# Patient Record
Sex: Male | Born: 1955 | Race: White | Hispanic: No | Marital: Single | Smoking: Former smoker
Health system: Southern US, Community
[De-identification: ages and names within clinical notes are randomized; demographics above are authoritative.]

## PROBLEM LIST (undated history)

## (undated) DIAGNOSIS — K449 Diaphragmatic hernia without obstruction or gangrene: Secondary | ICD-10-CM

## (undated) DIAGNOSIS — Z860100 Personal history of colon polyps, unspecified: Secondary | ICD-10-CM

## (undated) DIAGNOSIS — F419 Anxiety disorder, unspecified: Secondary | ICD-10-CM

## (undated) DIAGNOSIS — K589 Irritable bowel syndrome without diarrhea: Secondary | ICD-10-CM

## (undated) DIAGNOSIS — E7800 Pure hypercholesterolemia, unspecified: Secondary | ICD-10-CM

## (undated) DIAGNOSIS — I10 Essential (primary) hypertension: Secondary | ICD-10-CM

## (undated) DIAGNOSIS — M1990 Unspecified osteoarthritis, unspecified site: Secondary | ICD-10-CM

## (undated) DIAGNOSIS — G5601 Carpal tunnel syndrome, right upper limb: Secondary | ICD-10-CM

## (undated) DIAGNOSIS — Z8669 Personal history of other diseases of the nervous system and sense organs: Secondary | ICD-10-CM

## (undated) DIAGNOSIS — C801 Malignant (primary) neoplasm, unspecified: Secondary | ICD-10-CM

## (undated) DIAGNOSIS — K222 Esophageal obstruction: Secondary | ICD-10-CM

## (undated) DIAGNOSIS — N529 Male erectile dysfunction, unspecified: Secondary | ICD-10-CM

## (undated) DIAGNOSIS — K219 Gastro-esophageal reflux disease without esophagitis: Secondary | ICD-10-CM

## (undated) DIAGNOSIS — K2270 Barrett's esophagus without dysplasia: Secondary | ICD-10-CM

## (undated) DIAGNOSIS — K2210 Ulcer of esophagus without bleeding: Secondary | ICD-10-CM

## (undated) DIAGNOSIS — T7840XA Allergy, unspecified, initial encounter: Secondary | ICD-10-CM

## (undated) DIAGNOSIS — D649 Anemia, unspecified: Secondary | ICD-10-CM

## (undated) HISTORY — DX: Gastro-esophageal reflux disease without esophagitis: K21.9

## (undated) HISTORY — DX: Malignant (primary) neoplasm, unspecified: C80.1

## (undated) HISTORY — DX: Irritable bowel syndrome, unspecified: K58.9

## (undated) HISTORY — PX: INGUINAL HERNIA REPAIR: SUR1180

## (undated) HISTORY — DX: Allergy, unspecified, initial encounter: T78.40XA

## (undated) HISTORY — DX: Ulcer of esophagus without bleeding: K22.10

## (undated) HISTORY — PX: CARPAL TUNNEL RELEASE: SHX101

## (undated) HISTORY — DX: Anxiety disorder, unspecified: F41.9

## (undated) HISTORY — DX: Carpal tunnel syndrome, right upper limb: G56.01

## (undated) HISTORY — DX: Personal history of other diseases of the nervous system and sense organs: Z86.69

## (undated) HISTORY — DX: Personal history of colon polyps, unspecified: Z86.0100

## (undated) HISTORY — DX: Male erectile dysfunction, unspecified: N52.9

## (undated) HISTORY — PX: WISDOM TOOTH EXTRACTION: SHX21

## (undated) HISTORY — DX: Barrett's esophagus without dysplasia: K22.70

## (undated) HISTORY — DX: Diaphragmatic hernia without obstruction or gangrene: K44.9

## (undated) HISTORY — DX: Personal history of colonic polyps: Z86.010

## (undated) HISTORY — PX: KNEE ARTHROSCOPY: SUR90

## (undated) HISTORY — DX: Esophageal obstruction: K22.2

## (undated) HISTORY — DX: Essential (primary) hypertension: I10

---

## 2000-08-15 ENCOUNTER — Encounter: Payer: Self-pay | Admitting: Internal Medicine

## 2000-08-15 ENCOUNTER — Ambulatory Visit (HOSPITAL_COMMUNITY): Admission: RE | Admit: 2000-08-15 | Discharge: 2000-08-15 | Payer: Self-pay | Admitting: Internal Medicine

## 2003-08-09 ENCOUNTER — Encounter: Payer: Self-pay | Admitting: Internal Medicine

## 2003-08-09 ENCOUNTER — Ambulatory Visit (HOSPITAL_COMMUNITY): Admission: RE | Admit: 2003-08-09 | Discharge: 2003-08-09 | Payer: Self-pay | Admitting: Internal Medicine

## 2004-07-29 ENCOUNTER — Ambulatory Visit (HOSPITAL_COMMUNITY): Admission: RE | Admit: 2004-07-29 | Discharge: 2004-07-29 | Payer: Self-pay | Admitting: Otolaryngology

## 2004-11-28 ENCOUNTER — Ambulatory Visit: Payer: Self-pay | Admitting: Gastroenterology

## 2005-01-20 ENCOUNTER — Ambulatory Visit: Payer: Self-pay | Admitting: Gastroenterology

## 2005-02-02 ENCOUNTER — Ambulatory Visit: Payer: Self-pay | Admitting: Gastroenterology

## 2005-02-02 DIAGNOSIS — D126 Benign neoplasm of colon, unspecified: Secondary | ICD-10-CM | POA: Insufficient documentation

## 2005-02-02 DIAGNOSIS — K644 Residual hemorrhoidal skin tags: Secondary | ICD-10-CM | POA: Insufficient documentation

## 2005-03-03 ENCOUNTER — Ambulatory Visit: Payer: Self-pay | Admitting: Internal Medicine

## 2005-11-29 ENCOUNTER — Ambulatory Visit: Payer: Self-pay | Admitting: Gastroenterology

## 2006-03-02 ENCOUNTER — Ambulatory Visit: Payer: Self-pay | Admitting: Internal Medicine

## 2006-03-16 ENCOUNTER — Ambulatory Visit: Payer: Self-pay | Admitting: Internal Medicine

## 2006-03-30 ENCOUNTER — Ambulatory Visit: Payer: Self-pay | Admitting: Gastroenterology

## 2006-04-06 ENCOUNTER — Ambulatory Visit (HOSPITAL_COMMUNITY): Admission: RE | Admit: 2006-04-06 | Discharge: 2006-04-06 | Payer: Self-pay | Admitting: Gastroenterology

## 2006-04-06 ENCOUNTER — Encounter: Payer: Self-pay | Admitting: Internal Medicine

## 2006-04-13 ENCOUNTER — Ambulatory Visit: Payer: Self-pay | Admitting: Pulmonary Disease

## 2006-04-17 ENCOUNTER — Ambulatory Visit: Payer: Self-pay | Admitting: Internal Medicine

## 2006-04-18 ENCOUNTER — Ambulatory Visit: Payer: Self-pay | Admitting: Pulmonary Disease

## 2006-04-23 ENCOUNTER — Ambulatory Visit: Payer: Self-pay | Admitting: Gastroenterology

## 2006-05-17 ENCOUNTER — Encounter (INDEPENDENT_AMBULATORY_CARE_PROVIDER_SITE_OTHER): Payer: Self-pay | Admitting: Specialist

## 2006-05-17 ENCOUNTER — Ambulatory Visit: Payer: Self-pay | Admitting: Gastroenterology

## 2006-06-13 ENCOUNTER — Ambulatory Visit: Payer: Self-pay | Admitting: Internal Medicine

## 2006-10-26 ENCOUNTER — Ambulatory Visit: Payer: Self-pay | Admitting: Internal Medicine

## 2006-11-09 ENCOUNTER — Ambulatory Visit: Payer: Self-pay | Admitting: Gastroenterology

## 2007-01-18 ENCOUNTER — Ambulatory Visit: Payer: Self-pay | Admitting: Internal Medicine

## 2007-03-15 ENCOUNTER — Ambulatory Visit: Payer: Self-pay | Admitting: Internal Medicine

## 2007-03-15 LAB — CONVERTED CEMR LAB
ALT: 19 U/L (ref 0–40)
AST: 21 U/L (ref 0–37)
Albumin: 3.8 g/dL (ref 3.5–5.2)
Alkaline Phosphatase: 97 U/L (ref 39–117)
BUN: 8 mg/dL (ref 6–23)
Basophils Absolute: 0 K/uL (ref 0.0–0.1)
Basophils Relative: 0.3 % (ref 0.0–1.0)
Bilirubin Urine: NEGATIVE
Bilirubin, Direct: 0.2 mg/dL (ref 0.0–0.3)
CO2: 33 meq/L — ABNORMAL HIGH (ref 19–32)
Calcium: 8.8 mg/dL (ref 8.4–10.5)
Chloride: 105 meq/L (ref 96–112)
Cholesterol: 217 mg/dL (ref 0–200)
Creatinine, Ser: 0.8 mg/dL (ref 0.4–1.5)
Direct LDL: 142.6 mg/dL
Eosinophils Absolute: 0.2 K/uL (ref 0.0–0.6)
Eosinophils Relative: 3.5 % (ref 0.0–5.0)
GFR calc Af Amer: 131 mL/min
GFR calc non Af Amer: 108 mL/min
Glucose, Bld: 122 mg/dL — ABNORMAL HIGH (ref 70–99)
HCT: 42.2 % (ref 39.0–52.0)
HDL: 55.5 mg/dL (ref >39.0)
Hemoglobin, Urine: NEGATIVE
Hemoglobin: 15 g/dL (ref 13.0–17.0)
Hgb A1c MFr Bld: 5.8 % (ref 4.6–6.0)
Ketones, ur: NEGATIVE mg/dL
Leukocytes, UA: NEGATIVE
Lymphocytes Relative: 20.5 % (ref 12.0–46.0)
MCHC: 35.5 g/dL (ref 30.0–36.0)
MCV: 95.7 fL (ref 78.0–100.0)
Monocytes Absolute: 0.7 K/uL (ref 0.2–0.7)
Monocytes Relative: 9.8 % (ref 3.0–11.0)
Neutro Abs: 4.7 K/uL (ref 1.4–7.7)
Neutrophils Relative %: 65.9 % (ref 43.0–77.0)
Nitrite: NEGATIVE
PSA: 2.7 ng/mL (ref 0.10–4.00)
Platelets: 285 K/uL (ref 150–400)
Potassium: 4.4 meq/L (ref 3.5–5.1)
RBC: 4.42 M/uL (ref 4.22–5.81)
RDW: 12.6 % (ref 11.5–14.6)
Sodium: 142 meq/L (ref 135–145)
Specific Gravity, Urine: 1.015 (ref 1.000–1.03)
TSH: 0.41 u[IU]/mL (ref 0.35–5.50)
Total Bilirubin: 0.7 mg/dL (ref 0.3–1.2)
Total CHOL/HDL Ratio: 3.9
Total Protein, Urine: NEGATIVE mg/dL
Total Protein: 7.1 g/dL (ref 6.0–8.3)
Triglycerides: 148 mg/dL (ref 0–149)
Urine Glucose: NEGATIVE mg/dL
Urobilinogen, UA: 0.2 (ref 0.0–1.0)
VLDL: 30 mg/dL (ref 0–40)
WBC: 7.1 10*3/microliter (ref 4.5–10.5)
pH: 6 (ref 5.0–8.0)

## 2007-11-19 ENCOUNTER — Ambulatory Visit: Payer: Self-pay | Admitting: Gastroenterology

## 2007-11-27 ENCOUNTER — Ambulatory Visit: Payer: Self-pay | Admitting: Gastroenterology

## 2007-11-27 LAB — CONVERTED CEMR LAB
OCCULT 1: NEGATIVE
OCCULT 3: NEGATIVE
OCCULT 4: NEGATIVE

## 2008-01-08 DIAGNOSIS — K5900 Constipation, unspecified: Secondary | ICD-10-CM | POA: Insufficient documentation

## 2008-01-08 DIAGNOSIS — G4730 Sleep apnea, unspecified: Secondary | ICD-10-CM | POA: Insufficient documentation

## 2008-01-08 DIAGNOSIS — Z8669 Personal history of other diseases of the nervous system and sense organs: Secondary | ICD-10-CM | POA: Insufficient documentation

## 2008-01-08 DIAGNOSIS — F329 Major depressive disorder, single episode, unspecified: Secondary | ICD-10-CM | POA: Insufficient documentation

## 2008-01-08 DIAGNOSIS — K589 Irritable bowel syndrome without diarrhea: Secondary | ICD-10-CM | POA: Insufficient documentation

## 2008-01-08 DIAGNOSIS — F17200 Nicotine dependence, unspecified, uncomplicated: Secondary | ICD-10-CM | POA: Insufficient documentation

## 2008-01-08 DIAGNOSIS — E7800 Pure hypercholesterolemia, unspecified: Secondary | ICD-10-CM | POA: Insufficient documentation

## 2008-01-08 DIAGNOSIS — K5909 Other constipation: Secondary | ICD-10-CM

## 2008-01-08 DIAGNOSIS — T7840XA Allergy, unspecified, initial encounter: Secondary | ICD-10-CM | POA: Insufficient documentation

## 2008-01-08 DIAGNOSIS — F411 Generalized anxiety disorder: Secondary | ICD-10-CM | POA: Insufficient documentation

## 2008-01-08 DIAGNOSIS — K219 Gastro-esophageal reflux disease without esophagitis: Secondary | ICD-10-CM | POA: Insufficient documentation

## 2008-01-08 DIAGNOSIS — K5730 Diverticulosis of large intestine without perforation or abscess without bleeding: Secondary | ICD-10-CM | POA: Insufficient documentation

## 2008-02-07 ENCOUNTER — Ambulatory Visit: Payer: Self-pay | Admitting: Internal Medicine

## 2008-02-07 ENCOUNTER — Encounter: Payer: Self-pay | Admitting: Internal Medicine

## 2008-02-07 DIAGNOSIS — D509 Iron deficiency anemia, unspecified: Secondary | ICD-10-CM | POA: Insufficient documentation

## 2008-02-07 DIAGNOSIS — J309 Allergic rhinitis, unspecified: Secondary | ICD-10-CM | POA: Insufficient documentation

## 2008-02-07 DIAGNOSIS — Z86010 Personal history of colonic polyps: Secondary | ICD-10-CM | POA: Insufficient documentation

## 2008-02-07 LAB — CONVERTED CEMR LAB
ALT: 21 units/L (ref 0–53)
AST: 21 units/L (ref 0–37)
Albumin: 3.8 g/dL (ref 3.5–5.2)
Alkaline Phosphatase: 89 units/L (ref 39–117)
BUN: 7 mg/dL (ref 6–23)
Basophils Relative: 0.2 % (ref 0.0–1.0)
Bilirubin Urine: NEGATIVE
Calcium: 9 mg/dL (ref 8.4–10.5)
Chloride: 107 meq/L (ref 96–112)
Direct LDL: 143 mg/dL
Folate: 19.7 ng/mL
GFR calc non Af Amer: 125 mL/min
Glucose, Bld: 100 mg/dL — ABNORMAL HIGH (ref 70–99)
HDL: 54.9 mg/dL (ref >39.0)
Hemoglobin: 14.2 g/dL (ref 13.0–17.0)
Leukocytes, UA: NEGATIVE
Monocytes Absolute: 0.7 10*3/uL (ref 0.2–0.7)
Monocytes Relative: 9.7 % (ref 3.0–11.0)
PSA: 2.06 ng/mL (ref 0.10–4.00)
RBC: 4.21 M/uL — ABNORMAL LOW (ref 4.22–5.81)
RDW: 12.9 % (ref 11.5–14.6)
Saturation Ratios: 38.9 % (ref 20.0–50.0)
Specific Gravity, Urine: 1.01 (ref 1.000–1.03)
Total Protein, Urine: NEGATIVE mg/dL
Urobilinogen, UA: 0.2 (ref 0.0–1.0)
VLDL: 31 mg/dL (ref 0–40)
Vitamin B-12: 964 pg/mL — ABNORMAL HIGH (ref 211–911)
pH: 6 (ref 5.0–8.0)

## 2008-03-24 ENCOUNTER — Ambulatory Visit: Payer: Self-pay | Admitting: Gastroenterology

## 2008-04-10 ENCOUNTER — Encounter: Payer: Self-pay | Admitting: Gastroenterology

## 2008-04-10 ENCOUNTER — Encounter: Payer: Self-pay | Admitting: Internal Medicine

## 2008-04-10 ENCOUNTER — Ambulatory Visit: Payer: Self-pay | Admitting: Gastroenterology

## 2008-08-07 ENCOUNTER — Ambulatory Visit: Payer: Self-pay | Admitting: Gastroenterology

## 2008-09-07 ENCOUNTER — Ambulatory Visit: Payer: Self-pay | Admitting: Internal Medicine

## 2008-09-07 DIAGNOSIS — L0231 Cutaneous abscess of buttock: Secondary | ICD-10-CM | POA: Insufficient documentation

## 2008-09-07 DIAGNOSIS — L03317 Cellulitis of buttock: Secondary | ICD-10-CM

## 2009-02-26 ENCOUNTER — Ambulatory Visit: Payer: Self-pay | Admitting: Internal Medicine

## 2009-02-26 DIAGNOSIS — R030 Elevated blood-pressure reading, without diagnosis of hypertension: Secondary | ICD-10-CM | POA: Insufficient documentation

## 2009-02-26 DIAGNOSIS — L989 Disorder of the skin and subcutaneous tissue, unspecified: Secondary | ICD-10-CM | POA: Insufficient documentation

## 2009-03-02 LAB — CONVERTED CEMR LAB
ALT: 21 units/L (ref 0–53)
AST: 19 units/L (ref 0–37)
Albumin: 3.4 g/dL — ABNORMAL LOW (ref 3.5–5.2)
BUN: 11 mg/dL (ref 6–23)
Basophils Absolute: 0.1 10*3/uL (ref 0.0–0.1)
Basophils Relative: 1.2 % (ref 0.0–3.0)
CO2: 32 meq/L (ref 19–32)
Calcium: 8.7 mg/dL (ref 8.4–10.5)
Chloride: 108 meq/L (ref 96–112)
Cholesterol: 169 mg/dL (ref 0–200)
Creatinine, Ser: 0.9 mg/dL (ref 0.4–1.5)
Eosinophils Relative: 2.8 % (ref 0.0–5.0)
Hemoglobin: 10 g/dL — ABNORMAL LOW (ref 13.0–17.0)
Ketones, ur: NEGATIVE mg/dL
Leukocytes, UA: NEGATIVE
Lymphocytes Relative: 16.6 % (ref 12.0–46.0)
MCHC: 33 g/dL (ref 30.0–36.0)
MCV: 88.3 fL (ref 78.0–100.0)
Neutro Abs: 4.8 10*3/uL (ref 1.4–7.7)
PSA: 1.97 ng/mL (ref 0.10–4.00)
RBC: 3.44 M/uL — ABNORMAL LOW (ref 4.22–5.81)
Specific Gravity, Urine: 1.015 (ref 1.000–1.035)
TSH: 0.5 microintl units/mL (ref 0.35–5.50)
Total Bilirubin: 0.6 mg/dL (ref 0.3–1.2)
Total Protein: 6.6 g/dL (ref 6.0–8.3)
Triglycerides: 133 mg/dL (ref 0–149)
Urine Glucose: NEGATIVE mg/dL
WBC: 6.9 10*3/uL (ref 4.5–10.5)
pH: 7 (ref 5.0–8.0)

## 2009-03-05 ENCOUNTER — Ambulatory Visit: Payer: Self-pay | Admitting: Internal Medicine

## 2009-03-05 DIAGNOSIS — R7302 Impaired glucose tolerance (oral): Secondary | ICD-10-CM | POA: Insufficient documentation

## 2009-03-09 ENCOUNTER — Encounter: Payer: Self-pay | Admitting: Internal Medicine

## 2009-04-16 ENCOUNTER — Ambulatory Visit: Payer: Self-pay | Admitting: Internal Medicine

## 2009-04-16 LAB — CONVERTED CEMR LAB
Basophils Absolute: 0 10*3/uL (ref 0.0–0.1)
Basophils Relative: 0.1 % (ref 0.0–3.0)
Eosinophils Absolute: 0.2 10*3/uL (ref 0.0–0.7)
Eosinophils Relative: 2.1 % (ref 0.0–5.0)
HCT: 36.2 % — ABNORMAL LOW (ref 39.0–52.0)
Hemoglobin: 12.5 g/dL — ABNORMAL LOW (ref 13.0–17.0)
Lymphs Abs: 1.5 10*3/uL (ref 0.7–4.0)
MCV: 92.9 fL (ref 78.0–100.0)
Monocytes Absolute: 0.8 10*3/uL (ref 0.1–1.0)
Monocytes Relative: 10 % (ref 3.0–12.0)
Neutro Abs: 5.9 10*3/uL (ref 1.4–7.7)
Platelets: 283 10*3/uL (ref 150.0–400.0)
RBC: 3.89 M/uL — ABNORMAL LOW (ref 4.22–5.81)
Saturation Ratios: 13.3 % — ABNORMAL LOW (ref 20.0–50.0)
Transferrin: 262.8 mg/dL (ref 212.0–360.0)
WBC: 8.4 10*3/uL (ref 4.5–10.5)

## 2009-08-06 ENCOUNTER — Ambulatory Visit: Payer: Self-pay | Admitting: Gastroenterology

## 2009-08-06 DIAGNOSIS — E669 Obesity, unspecified: Secondary | ICD-10-CM | POA: Insufficient documentation

## 2009-08-06 LAB — CONVERTED CEMR LAB
Alkaline Phosphatase: 90 units/L (ref 39–117)
Basophils Absolute: 0 10*3/uL (ref 0.0–0.1)
Bilirubin, Direct: 0.1 mg/dL (ref 0.0–0.3)
CO2: 32 meq/L (ref 19–32)
Calcium: 8.8 mg/dL (ref 8.4–10.5)
Eosinophils Absolute: 0.2 10*3/uL (ref 0.0–0.7)
GFR calc non Af Amer: 106.61 mL/min (ref >60)
HCT: 38.2 % — ABNORMAL LOW (ref 39.0–52.0)
Lymphs Abs: 1.3 10*3/uL (ref 0.7–4.0)
Monocytes Relative: 8.6 % (ref 3.0–12.0)
Platelets: 250 10*3/uL (ref 150.0–400.0)
RDW: 13.5 % (ref 11.5–14.6)
Sodium: 145 meq/L (ref 135–145)
Total Bilirubin: 0.8 mg/dL (ref 0.3–1.2)
Total Protein: 6.7 g/dL (ref 6.0–8.3)

## 2009-08-20 ENCOUNTER — Ambulatory Visit: Payer: Self-pay | Admitting: Internal Medicine

## 2009-08-20 DIAGNOSIS — M25569 Pain in unspecified knee: Secondary | ICD-10-CM | POA: Insufficient documentation

## 2009-09-02 ENCOUNTER — Telehealth: Payer: Self-pay | Admitting: Internal Medicine

## 2009-09-03 ENCOUNTER — Encounter: Admission: RE | Admit: 2009-09-03 | Discharge: 2009-09-03 | Payer: Self-pay | Admitting: Internal Medicine

## 2009-09-04 ENCOUNTER — Encounter: Payer: Self-pay | Admitting: Internal Medicine

## 2009-09-06 ENCOUNTER — Encounter: Payer: Self-pay | Admitting: Internal Medicine

## 2009-09-30 ENCOUNTER — Encounter: Admission: RE | Admit: 2009-09-30 | Discharge: 2009-11-04 | Payer: Self-pay | Admitting: Specialist

## 2010-02-25 ENCOUNTER — Ambulatory Visit: Payer: Self-pay | Admitting: Gastroenterology

## 2010-02-25 LAB — CONVERTED CEMR LAB
Basophils Absolute: 0 10*3/uL (ref 0.0–0.1)
Eosinophils Absolute: 0.2 10*3/uL (ref 0.0–0.7)
Ferritin: 25.8 ng/mL (ref 22.0–322.0)
Folate: >20 ng/mL
Iron: 85 ug/dL (ref 42–165)
Lymphocytes Relative: 18 % (ref 12.0–46.0)
MCHC: 34.1 g/dL (ref 30.0–36.0)
Monocytes Relative: 5.4 % (ref 3.0–12.0)
Neutrophils Relative %: 73.4 % (ref 43.0–77.0)
Platelets: 234 10*3/uL (ref 150.0–400.0)
RDW: 12.8 % (ref 11.5–14.6)
Saturation Ratios: 25.5 % (ref 20.0–50.0)
Transferrin: 238.1 mg/dL (ref 212.0–360.0)

## 2010-03-25 ENCOUNTER — Ambulatory Visit: Payer: Self-pay | Admitting: Internal Medicine

## 2010-03-25 DIAGNOSIS — R209 Unspecified disturbances of skin sensation: Secondary | ICD-10-CM | POA: Insufficient documentation

## 2010-03-25 DIAGNOSIS — N529 Male erectile dysfunction, unspecified: Secondary | ICD-10-CM | POA: Insufficient documentation

## 2010-03-25 LAB — CONVERTED CEMR LAB
ALT: 19 units/L (ref 0–53)
AST: 19 units/L (ref 0–37)
Alkaline Phosphatase: 91 units/L (ref 39–117)
Chloride: 105 meq/L (ref 96–112)
Cholesterol: 208 mg/dL — ABNORMAL HIGH (ref 0–200)
Direct LDL: 151.7 mg/dL
GFR calc non Af Amer: 106.36 mL/min (ref >60)
Hgb A1c MFr Bld: 5.9 % (ref 4.6–6.5)
Ketones, ur: NEGATIVE mg/dL
Microalb Creat Ratio: 18.6 mg/g (ref 0.0–30.0)
Potassium: 4 meq/L (ref 3.5–5.1)
Specific Gravity, Urine: >=1.03 (ref 1.000–1.030)
TSH: 0.64 microintl units/mL (ref 0.35–5.50)
Total Bilirubin: 0.5 mg/dL (ref 0.3–1.2)
Total CHOL/HDL Ratio: 3
Total Protein, Urine: NEGATIVE mg/dL
Triglycerides: 39 mg/dL (ref 0.0–149.0)
Urine Glucose: NEGATIVE mg/dL
Urobilinogen, UA: 0.2 (ref 0.0–1.0)
VLDL: 7.8 mg/dL (ref 0.0–40.0)

## 2010-03-26 LAB — CONVERTED CEMR LAB: Vit D, 25-Hydroxy: 20 ng/mL — ABNORMAL LOW (ref 30–89)

## 2011-01-15 LAB — CONVERTED CEMR LAB
Folate: >20 ng/mL
Saturation Ratios: 4.9 % — ABNORMAL LOW (ref 20.0–50.0)

## 2011-01-17 NOTE — Assessment & Plan Note (Signed)
Summary: FU ON MEDS/NWS   Vital Signs:  Patient profile:   55 year old male Height:      73 inches Weight:      270.75 pounds BMI:     35.85 O2 Sat:      96 % on Room air Temp:     97.7 degrees F oral Pulse rate:   78 / minute BP sitting:   120 / 70  (left arm) Cuff size:   regular  Vitals Entered ByZella Ball Ewing (March 25, 2010 10:17 AM)  O2 Flow:  Room air  CC: followup on medications/RE   Primary Care Provider:  Oliver Barre, MD  CC:  followup on medications/RE.  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  Overall good compliance with meds, trying to follow low chol, DM diet, wt stable, little excercise however   Problems Prior to Update: 1)  Erectile Dysfunction, Organic  (ICD-607.84) 2)  Paresthesia, Hands  (ICD-782.0) 3)  Knee Pain, Right  (ICD-719.46) 4)  Obesity, Unspecified  (ICD-278.00) 5)  Cigarette Smoker  (ICD-305.1) 6)  Obesity  (ICD-278.00) 7)  Diabetes Mellitus, Type II  (ICD-250.00) 8)  Elevated Blood Pressure Without Diagnosis of Hypertension  (ICD-796.2) 9)  Skin Lesions, Multiple  (ICD-709.9) 10)  Cellulitis and Abscess of Buttock  (ICD-682.5) 11)  Preventive Health Care  (ICD-V70.0) 12)  Allergic Rhinitis  (ICD-477.9) 13)  Colonic Polyps, Hx of  (ICD-V12.72) 14)  Anemia-iron Deficiency  (ICD-280.9) 15)  Gerd  (ICD-530.81) 16)  Depression  (ICD-311) 17)  Colonic Polyps  (ICD-211.3) 18)  External Hemorrhoids   (ICD-455.3) 19)  Sleep Apnea  (ICD-780.57) 20)  Smoker  (ICD-305.1) 21)  Hypercholesterolemia  (ICD-272.0) 22)  Bell's Palsy, Hx of  (ICD-V12.49) 23)  Diverticulosis, Colon  (ICD-562.10) 24)  Allergy  (ICD-995.3) 25)  Anxiety  (ICD-300.00) 26)  Ibs  (ICD-564.1) 27)  Depression, Chronic  (ICD-311) 28)  Constipation, Chronic  (ICD-564.09) 29)  Gastroesophageal Reflux Disease, Chronic  (ICD-530.81)  Medications Prior to Update: 1)  Levitra 20 Mg Tabs (Vardenafil Hcl) .... Take 1 Tablet By Mouth As Needed 2)  Aciphex 20 Mg  Tbec (Rabeprazole Sodium) .... Take One By Mouth Once Daily 3)  Metamucil 30.9 % Powd (Psyllium) .... Once Daily 4)  Multivitamins  Tabs (Multiple Vitamin) .... Take 1 Tablet By Mouth Once A Day 5)  Vitamin C .... Take 1 Tablet By Mouth Once A Day 6)  Fe-Caps 250 Mg Cr-Caps (Ferrous Sulfate) .... One Tablet By Mouth Once Daily  Current Medications (verified): 1)  Levitra 20 Mg Tabs (Vardenafil Hcl) .... Take 1 Tablet By Mouth As Needed 2)  Aciphex 20 Mg  Tbec (Rabeprazole Sodium) .... Take One By Mouth Once Daily 3)  Metamucil 30.9 % Powd (Psyllium) .... Once Daily 4)  Multivitamins  Tabs (Multiple Vitamin) .... Take 1 Tablet By Mouth Once A Day 5)  Vitamin C .... Take 1 Tablet By Mouth Once A  Day 6)  Fe-Caps 250 Mg Cr-Caps (Ferrous Sulfate) .... One Tablet By Mouth Once Daily  Allergies (verified): 1)  ! Aspirin 2)  ! Amoxicillin  Past History:  Past Medical History: Last updated: 04/16/2009 chronic bronchitis hx of bell's palsy E.D. 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  Risk Factors: Exercise: yes (08/07/2008)  Risk Factors: Smoking Status: current (02/07/2008)  Past Surgical History: Inguinal herniorrhaphy - bilat s/p right knee arthroscopy - dr Thomasena Edis  Review of Systems  The patient denies anorexia, fever, vision loss, decreased hearing, hoarseness, chest pain, syncope, dyspnea on exertion, peripheral edema, prolonged cough, headaches, hemoptysis, abdominal pain, melena, hematochezia, severe indigestion/heartburn, hematuria, muscle weakness, suspicious skin lesions, transient blindness, difficulty walking, depression, unusual weight change, abnormal bleeding, enlarged lymph nodes, and angioedema.         all otherwise negative per pt -    Physical Exam  General:  alert and overweight-appearing.   Head:  normocephalic and atraumatic.   Eyes:  vision grossly intact, pupils equal, and pupils round.   Ears:  R ear normal and L ear normal.   Nose:  no external deformity and no nasal discharge.   Mouth:  no gingival abnormalities and pharynx pink and moist.   Neck:  supple and no masses.   Lungs:  normal respiratory effort and normal breath sounds.   Heart:  normal rate and regular rhythm.   Abdomen:  soft, non-tender, and normal bowel sounds.   Msk:  no joint tenderness and no joint swelling.   Extremities:  no edema, no erythema  Neurologic:  cranial nerves II-XII intact and strength normal in all extremities.     Impression & Recommendations:  Problem # 1:  Preventive Health Care (ICD-V70.0)  Overall doing well, age appropriate education and counseling updated and referral for appropriate preventive services done unless declined, immunizations up to date or declined, diet counseling done if overweight, urged to quit smoking if smokes , most recent  labs reviewed and current ordered if appropriate, ecg reviewed or declined (interpretation per ECG scanned in the EMR if done); information regarding Medicare Prevention requirements given if appropriate   Orders: T-Vitamin D (25-Hydroxy) (91478-29562) TLB-Hepatic/Liver Function Pnl (80076-HEPATIC) TLB-TSH (Thyroid Stimulating Hormone) (84443-TSH) TLB-PSA (Prostate Specific Antigen) (84153-PSA) TLB-Udip ONLY (81003-UDIP)  Problem # 2:  PARESTHESIA, HANDS (ICD-782.0) c/w prob mild CTS bialt - for nighttime wrist splints  Problem # 3:  ERECTILE DYSFUNCTION, ORGANIC (ICD-607.84)  His updated medication list for this problem includes:    Levitra 20 Mg Tabs (Vardenafil hcl) .Marland Kitchen... Take 1 tablet by mouth as needed treat as above, f/u any worsening signs or symptoms   Problem # 4:  DIABETES MELLITUS, TYPE II (ICD-250.00)  Labs Reviewed: Creat: 0.8 (08/06/2009)    Reviewed HgBA1c results: 6.1 (03/05/2009)  5.8 (03/15/2007)  Orders: TLB-A1C / Hgb A1C (Glycohemoglobin) (83036-A1C) TLB-BMP (Basic Metabolic Panel-BMET) (80048-METABOL) TLB-Lipid Panel (80061-LIPID) TLB-Microalbumin/Creat Ratio, Urine (82043-MALB) stable overall by hx and exam, ok to continue meds/tx as is   Problem # 5:  HYPERCHOLESTEROLEMIA (ICD-272.0)  Labs Reviewed: SGOT: 18 (08/06/2009)   SGPT: 18 (08/06/2009)   HDL:51.3 (02/26/2009), 54.9 (02/07/2008)  LDL:91 (02/26/2009), DEL (13/07/6577)  Chol:169 (02/26/2009), 217 (02/07/2008)  Trig:133 (02/26/2009), 156 (02/07/2008) stable overall by hx and exam, ok to continue meds/tx as is   Complete Medication List: 1)  Levitra 20 Mg Tabs (Vardenafil hcl) .... Take 1 tablet by mouth as needed 2)  Aciphex 20 Mg Tbec (Rabeprazole sodium) .... Take one by mouth once daily 3)  Metamucil 30.9 % Powd (Psyllium) .... Once daily 4)  Multivitamins Tabs (Multiple vitamin) .... Take 1 tablet by mouth once a day 5)  Vitamin C  .... Take 1 tablet by mouth once a day 6)  Fe-caps 250 Mg  Cr-caps (Ferrous sulfate) .... One tablet by mouth once daily  Other Orders: Ankle / Wrist Splint (I6962)  Patient Instructions: 1)  You are given the bilat wrist splints 2)  Please go to the Lab in the basement for your blood and/or urine tests today 3)  Continue all previous medications as before this visit  4)  Please schedule a follow-up appointment in 1 yr or sooner if needed Prescriptions: LEVITRA 20 MG TABS (VARDENAFIL HCL) Take 1 tablet by mouth as needed  #5 x 11   Entered and Authorized by:   Corwin Levins MD   Signed by:   Corwin Levins MD on 03/25/2010   Method used:   Electronically to        [facility] [address] #[id number]* (retail)       [address] [place], [place]  [address]       Ph: [phone]       Fax: [phone]   RxID:   [id number]

## 2011-01-17 NOTE — Assessment & Plan Note (Signed)
Summary: medication refill--ch.    History of Present Illness Visit Type: Follow-up Visit Primary GI MD: Sheryn Bison MD FACP FAGA Primary Provider: Oliver Barre, MD Requesting Provider: n/a Chief Complaint:  Pt states he needs Aciphex refilled. Pt denies any GI complaints  History of Present Illness:   55 year old Caucasian male with chronic GERD up-to-date nose endoscopy and colonoscopy exams. He has unexplained chronic iron deficiency and continues on oral iron therapy. He denies any GI complaints whatsoever taking AcipHex 20 mg a day. He continues to drink 6-8 beers a day but has no history of hepatitis or pancreatitis or consequences of alcohol abuse. Review of his labs otherwise were unremarkable.   GI Review of Systems      Denies abdominal pain, acid reflux, belching, bloating, chest pain, dysphagia with liquids, dysphagia with solids, heartburn, loss of appetite, nausea, vomiting, vomiting blood, weight loss, and  weight gain.        Denies anal fissure, black tarry stools, change in bowel habit, constipation, diarrhea, diverticulosis, fecal incontinence, heme positive stool, hemorrhoids, irritable bowel syndrome, jaundice, light color stool, liver problems, rectal bleeding, and  rectal pain.    Current Medications (verified): 1)  Levitra 20 Mg Tabs (Vardenafil Hcl) .... Take 1 Tablet By Mouth As Needed 2)  Aciphex 20 Mg  Tbec (Rabeprazole Sodium) .... Take One By Mouth Once Daily 3)  Metamucil 30.9 % Powd (Psyllium) .... Once Daily 4)  Multivitamins  Tabs (Multiple Vitamin) .... Take 1 Tablet By Mouth Once A Day 5)  Vitamin C .... Take 1 Tablet By Mouth Once A Day 6)  Fe-Caps 250 Mg Cr-Caps (Ferrous Sulfate) .... One Tablet By Mouth Once Daily  Allergies (verified): 1)  ! Aspirin 2)  ! Amoxicillin  Past History:  Past medical, surgical, family and social histories (including risk factors) reviewed for relevance to current acute and chronic problems.  Past Medical  History: Reviewed history from 04/16/2009 and no changes required. chronic bronchitis hx of bell's palsy E.D. Depression GERD hx of Barrett's/ulcerative esophagitis IBS chronic constipation Anemia-iron deficiency - for chronic oral therapy Diverticulosis, colon Colonic polyps, hx of - adenomatous Anxiety Allergic rhinitis prob OSA Diabetes mellitus, type II - diet  Past Surgical History: Reviewed history from 02/07/2008 and no changes required. Inguinal herniorrhaphy - bilat  Family History: Reviewed history from 08/07/2008 and no changes required. brother with HTN mother with supranuclear palsy father with heart disease/MI No FH of Colon Cancer  Social History: Reviewed history from 08/07/2008 and no changes required. 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  Review of Systems       The patient complains of anxiety-new.  The patient denies allergy/sinus, anemia, arthritis/joint pain, back pain, blood in urine, breast changes/lumps, change in vision, confusion, cough, coughing up blood, depression-new, fainting, fatigue, fever, headaches-new, hearing problems, heart murmur, heart rhythm changes, itching, muscle pains/cramps, night sweats, nosebleeds, shortness of breath, skin rash, sleeping problems, sore throat, swelling of feet/legs, swollen lymph glands, thirst - excessive, urination - excessive, urination changes/pain, urine leakage, vision changes, and voice change.    Vital Signs:  Patient profile:   55 year old male Height:      73 inches Weight:      274 pounds BMI:     36.28 BSA:     2.46 Pulse rate:   74 / minute Pulse rhythm:   regular BP sitting:   132 /  84  (left arm) Cuff size:   regular  Vitals Entered By: Ok Anis CMA (February 25, 2010 10:50 AM)  Physical Exam  General:  Well developed, well nourished, no acute distress.obese.    Head:  Normocephalic and atraumatic. Eyes:  PERRLA, no icterus.exam deferred to patient's ophthalmologist.   Abdomen:  Soft, nontender and nondistended. No masses, hepatosplenomegaly or hernias noted. Normal bowel sounds. Psych:  Alert and cooperative. Normal mood and affect.   Impression & Recommendations:  Problem # 1:  GERD (ICD-530.81) Assessment Improved Review of his record shows no evidence of Barrett's mucosa. Is completely asymptomatic at this time on daily PPI therapy which I have renewed and I have also reviewed reflux maneuvers with this patient.  Problem # 2:  ANEMIA-IRON DEFICIENCY (ICD-280.9) Assessment: Improved repeat CBC and iron studies ordered.  Patient Instructions: 1)  Copy sent to : Dr. Oliver Barre 2)  Please continue current medications.  3)  Avoid foods high in acid content ( tomatoes, citrus juices, spicy foods) . Avoid eating within 3 to 4 hours of lying down or before exercising. Do not over eat; try smaller more frequent meals. Elevate head of bed four inches when sleeping.  4)  Please schedule a follow-up appointment in 1 year. 5)  The medication list was reviewed and reconciled.  All changed / newly prescribed medications were explained.  A complete medication list was provided to the patient / caregiver.  Appended Document: medication refill--ch.    Clinical Lists Changes  Medications: Changed medication from ACIPHEX 20 MG  TBEC (RABEPRAZOLE SODIUM) take one by mouth once daily to ACIPHEX 20 MG  TBEC (RABEPRAZOLE SODIUM) take one by mouth once daily - Signed Rx of ACIPHEX 20 MG  TBEC (RABEPRAZOLE SODIUM) take one by mouth once daily;  #30 x 11;  Signed;  Entered by: Ashok Cordia RN;  Authorized by: Mardella Layman MD [facility];  Method used: Electronically to [facility] #[id number]*, [address] [place], [place]  [address], Ph: [phone], Fax: [phone] Orders: Added new Test order of TLB-B12, Serum-Total ONLY [id number]) - Signed Added  new Test order of TLB-Ferritin (82728-FER) - Signed Added new Test order of TLB-Folic Acid (Folate) (82746-FOL) - Signed Added new Test order of TLB-IBC Pnl (Iron/FE;Transferrin) (83550-IBC) - Signed Added new Test order of TLB-CBC Platelet - w/Differential (85025-CBCD) - Signed    Prescriptions: ACIPHEX 20 MG  TBEC (RABEPRAZOLE SODIUM) take one by mouth once daily  #30 x 11   Entered by:   Ashok Cordia RN   Authorized by:   Mardella Layman MD [facility]   Signed by:   Ashok Cordia RN on 02/25/2010   Method used:   Electronically to        [facility] [address] #[id number]* (retail)       [address] [place], [place]  [address]       Ph: [phone]       Fax: [phone]   RxID:   [id number]

## 2011-03-17 ENCOUNTER — Encounter: Payer: Self-pay | Admitting: Gastroenterology

## 2011-03-17 ENCOUNTER — Ambulatory Visit (INDEPENDENT_AMBULATORY_CARE_PROVIDER_SITE_OTHER): Payer: 59 | Admitting: Gastroenterology

## 2011-03-17 ENCOUNTER — Other Ambulatory Visit (INDEPENDENT_AMBULATORY_CARE_PROVIDER_SITE_OTHER): Payer: 59

## 2011-03-17 VITALS — BP 132/78 | HR 76 | Ht 73.0 in | Wt 274.0 lb

## 2011-03-17 DIAGNOSIS — D126 Benign neoplasm of colon, unspecified: Secondary | ICD-10-CM

## 2011-03-17 DIAGNOSIS — K5909 Other constipation: Secondary | ICD-10-CM

## 2011-03-17 DIAGNOSIS — Z86010 Personal history of colonic polyps: Secondary | ICD-10-CM

## 2011-03-17 DIAGNOSIS — E119 Type 2 diabetes mellitus without complications: Secondary | ICD-10-CM

## 2011-03-17 DIAGNOSIS — K219 Gastro-esophageal reflux disease without esophagitis: Secondary | ICD-10-CM

## 2011-03-17 DIAGNOSIS — D509 Iron deficiency anemia, unspecified: Secondary | ICD-10-CM

## 2011-03-17 LAB — CBC WITH DIFFERENTIAL/PLATELET
Basophils Absolute: 0.1 10*3/uL (ref 0.0–0.1)
HCT: 38.8 % — ABNORMAL LOW (ref 39.0–52.0)
Lymphs Abs: 1.4 10*3/uL (ref 0.7–4.0)
MCV: 97.1 fl (ref 78.0–100.0)
Monocytes Absolute: 0.7 10*3/uL (ref 0.1–1.0)
Neutro Abs: 4.8 10*3/uL (ref 1.4–7.7)
Platelets: 222 10*3/uL (ref 150.0–400.0)
RDW: 13.9 % (ref 11.5–14.6)

## 2011-03-17 LAB — HEPATIC FUNCTION PANEL
AST: 16 U/L (ref 0–37)
Albumin: 3.7 g/dL (ref 3.5–5.2)
Alkaline Phosphatase: 94 U/L (ref 39–117)
Total Bilirubin: 0.3 mg/dL (ref 0.3–1.2)

## 2011-03-17 LAB — FERRITIN: Ferritin: 44.5 ng/mL (ref 22.0–322.0)

## 2011-03-17 LAB — IBC PANEL: Saturation Ratios: 17.4 % — ABNORMAL LOW (ref 20.0–50.0)

## 2011-03-17 MED ORDER — RABEPRAZOLE SODIUM 20 MG PO TBEC: 20.0000 mg | DELAYED_RELEASE_TABLET | Freq: Every day | ORAL | Status: DC

## 2011-03-17 NOTE — Patient Instructions (Signed)
Please go to the basement today for your labs.  Your prescription(s) have been sent to you pharmacy.

## 2011-03-17 NOTE — Assessment & Plan Note (Signed)
Followup CBC an anemia profile ordered

## 2011-03-17 NOTE — Assessment & Plan Note (Signed)
followup as per clinical protocol

## 2011-03-17 NOTE — Assessment & Plan Note (Signed)
High-fiber diet as tolerated with followup colonoscopy as per clinical protocol.

## 2011-03-17 NOTE — Assessment & Plan Note (Signed)
High-fiber diet as tolerated

## 2011-03-17 NOTE — Assessment & Plan Note (Signed)
Continue meds per primary care

## 2011-03-17 NOTE — Progress Notes (Signed)
History of Present Illness: This is a 55 year old Caucasian male with chronic acid reflux doing well on daily AcipHex 20 mg. Has intermittent esophageal spasm with cold liquids but denies dysphagia for solid foods. His appetite is good and his weight is stable. He denies any lower gastrointestinal or hepatobiliary complaints. He does have a history of colon polyps, and is up-to-date on his colonoscopy exams. There is no history of bowel regularity or rectal bleeding. On reviewing his medications he is on daily ferrous sulfate. He is followed by Dr. Oliver Barre in primary care. He does smoke a half to one pack of cigarettes per day. Review of previous endoscopy does not show evidence of Barrett's mucosa. The patient also does have a history of heavy alcohol use daily.    Current Medications, Allergies, Past Medical History, Past Surgical History, Family History and Social History were reviewed in [employer] record.   Physical Exam: General: Well developed , well nourished, no acute distress. He has the appearance of acromegaly Head: Normocephalic and atraumatic Eyes:  sclerae anicteric, EOMI Lungs: Clear throughout to auscultation. Decreased breath sounds of both lung fields without wheezes or rhonchi noted. Heart: Regular rate and rhythm; no murmurs, rubs or bruits Abdomen: Soft, non tender and non distended. No masses, hepatosplenomegaly or hernias noted. Normal Bowel sounds    Assessment and plan: Chronic GERD doing well on PPI therapy. I will check repeat CBC and anemia profile per his history of iron deficiency. 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 Liver profile also added to his blood work. He does have intermittent esophageal spasm, and he is to call if he has solid food dysphagia, he may need followup endoscopy and dilation. His colonoscopy followup is due in 2014. Standard antireflux maneuvers review  with the patient today.

## 2011-04-18 ENCOUNTER — Encounter: Payer: Self-pay | Admitting: Internal Medicine

## 2011-04-18 DIAGNOSIS — Z0001 Encounter for general adult medical examination with abnormal findings: Secondary | ICD-10-CM | POA: Insufficient documentation

## 2011-04-18 DIAGNOSIS — Z0000 Encounter for general adult medical examination without abnormal findings: Secondary | ICD-10-CM | POA: Insufficient documentation

## 2011-04-21 ENCOUNTER — Ambulatory Visit (INDEPENDENT_AMBULATORY_CARE_PROVIDER_SITE_OTHER): Payer: 59 | Admitting: Internal Medicine

## 2011-04-21 ENCOUNTER — Encounter: Payer: Self-pay | Admitting: Internal Medicine

## 2011-04-21 ENCOUNTER — Other Ambulatory Visit (INDEPENDENT_AMBULATORY_CARE_PROVIDER_SITE_OTHER): Payer: 59

## 2011-04-21 VITALS — BP 120/72 | HR 74 | Temp 97.0°F | Ht 73.0 in | Wt 274.2 lb

## 2011-04-21 DIAGNOSIS — G5601 Carpal tunnel syndrome, right upper limb: Secondary | ICD-10-CM

## 2011-04-21 DIAGNOSIS — M7710 Lateral epicondylitis, unspecified elbow: Secondary | ICD-10-CM

## 2011-04-21 DIAGNOSIS — G5600 Carpal tunnel syndrome, unspecified upper limb: Secondary | ICD-10-CM

## 2011-04-21 DIAGNOSIS — Z0000 Encounter for general adult medical examination without abnormal findings: Secondary | ICD-10-CM

## 2011-04-21 DIAGNOSIS — E119 Type 2 diabetes mellitus without complications: Secondary | ICD-10-CM

## 2011-04-21 DIAGNOSIS — M7711 Lateral epicondylitis, right elbow: Secondary | ICD-10-CM

## 2011-04-21 HISTORY — DX: Carpal tunnel syndrome, right upper limb: G56.01

## 2011-04-21 LAB — LIPID PANEL
Cholesterol: 199 mg/dL (ref 0–200)
LDL Cholesterol: 128 mg/dL — ABNORMAL HIGH (ref 0–99)
Total CHOL/HDL Ratio: 4
VLDL: 16 mg/dL (ref 0.0–40.0)

## 2011-04-21 LAB — CBC WITH DIFFERENTIAL/PLATELET
Basophils Absolute: 0.1 10*3/uL (ref 0.0–0.1)
Basophils Relative: 1.9 % (ref 0.0–3.0)
Eosinophils Absolute: 0.2 10*3/uL (ref 0.0–0.7)
Hemoglobin: 13.2 g/dL (ref 13.0–17.0)
Lymphocytes Relative: 18.8 % (ref 12.0–46.0)
MCHC: 34.9 g/dL (ref 30.0–36.0)
MCV: 96.9 fl (ref 78.0–100.0)
Monocytes Absolute: 0.5 10*3/uL (ref 0.1–1.0)
Neutro Abs: 4.6 10*3/uL (ref 1.4–7.7)
Neutrophils Relative %: 68.2 % (ref 43.0–77.0)
RDW: 14.5 % (ref 11.5–14.6)

## 2011-04-21 LAB — BASIC METABOLIC PANEL
BUN: 13 mg/dL (ref 6–23)
Chloride: 107 mEq/L (ref 96–112)
GFR: 104.44 mL/min (ref >60.00)
Potassium: 4.2 mEq/L (ref 3.5–5.1)
Sodium: 141 mEq/L (ref 135–145)

## 2011-04-21 LAB — HEMOGLOBIN A1C: Hgb A1c MFr Bld: 5.8 % (ref 4.6–6.5)

## 2011-04-21 MED ORDER — VARDENAFIL HCL 20 MG PO TABS: 20.0000 mg | ORAL_TABLET | Freq: Every day | ORAL | Status: DC | PRN

## 2011-04-21 MED ORDER — DICLOFENAC SODIUM 75 MG PO TBEC: 75.0000 mg | DELAYED_RELEASE_TABLET | Freq: Two times a day (BID) | ORAL | Status: DC

## 2011-04-21 NOTE — Assessment & Plan Note (Signed)
Mild persistent, tender but no sweling - for nsaid prn,  to f/u any worsening symptoms or concerns

## 2011-04-21 NOTE — Patient Instructions (Signed)
Take all new medications as prescribed Continue all other medications as before Please go to LAB in the Basement for the blood and/or urine tests to be done today Please call the number on the Blue [employer] (the [employer] [facility]) for results of testing in 2-3 days You will be contacted regarding the referral for: hand surgury Please return in 1 year for your yearly visit, or sooner if needed, with Lab testing done 3-5 days before

## 2011-04-21 NOTE — Assessment & Plan Note (Signed)

## 2011-04-21 NOTE — Assessment & Plan Note (Signed)
stable overall by hx and exam, most recent lab reviewed with pt, and pt to continue medical treatment as before  Lab Results  Component Value Date   HGBA1C 5.9 03/25/2010

## 2011-04-21 NOTE — Assessment & Plan Note (Signed)
Mild to mod persistent despite splint at night;  For hand surgury referral

## 2011-04-23 ENCOUNTER — Encounter: Payer: Self-pay | Admitting: Internal Medicine

## 2011-04-23 NOTE — Progress Notes (Signed)
Subjective:    Patient ID: Henry Ellis, male    DOB: 05-31-1956, 55 y.o.   MRN: [id number]  HPI  Here for wellness and f/u;  Overall doing ok;  Pt denies CP, worsening SOB, DOE, wheezing, orthopnea, PND, worsening LE edema, palpitations, dizziness or syncope.  Pt denies neurological change such as new Headache, facial or extremity weakness.  Pt denies polydipsia, polyuria, or low sugar symptoms. Pt states overall good compliance with treatment and medications, good tolerability, and trying to follow lower cholesterol diet.  Pt denies worsening depressive symptoms, suicidal ideation or panic. No fever, wt loss, night sweats, loss of appetite, or other constitutional symptoms.  Pt states good ability with ADL's, low fall risk, home safety reviewed and adequate, no significant changes in hearing or vision, and occasionally active with exercise.  Does have bilat CTS symptoms, worse on the right, not improved with splints at night, mostly pain and numbness but not loss of grip.  Past Medical History  Diagnosis Date  . History of Bell's palsy   . Esophageal reflux   . ED (erectile dysfunction)   . Chronic bronchitis   . Barrett's esophagus   . Ulcerative esophagitis   . IBS (irritable bowel syndrome)   . Personal history of colonic polyps     adenomatous  . Anxiety   . Type II or unspecified type diabetes mellitus without mention of complication, not stated as uncontrolled   . Carpal tunnel syndrome, right 04/21/2011   Past Surgical History  Procedure Date  . Inguinal hernia repair     bilateral  . Knee arthroscopy     right Dr Thomasena Edis    reports that he has been smoking Cigarettes.  He has been smoking about .5 packs per day. He does not have any smokeless tobacco history on file. He reports that he drinks about 25.2 ounces of alcohol per week. He reports that he uses illicit drugs (Marijuana) about twice per week. family history includes Heart disease in his father; Hypertension in his  brother; and Lung cancer in his father. Allergies  Allergen Reactions  . Amoxicillin   . Aspirin    Current Outpatient Prescriptions on File Prior to Visit  Medication Sig Dispense Refill  . Ascorbic Acid (VITAMIN C PO) Take 1 tablet by mouth daily.        . Cholecalciferol (VITAMIN D3) 1000 UNITS CAPS Take 1 tablet by mouth daily.        . Ferrous Sulfate (FE-CAPS) 250 MG CPCR Take 1 capsule by mouth daily.        . Glucosamine HCl 1500 MG TABS Take 2 tablets by mouth daily.        . Multiple Vitamin (MULTIVITAMINS PO) Take 1 tablet by mouth daily.        . Psyllium (METAMUCIL) 30.9 % POWD Take by mouth daily.        . RABEprazole (ACIPHEX) 20 MG tablet Take 1 tablet (20 mg total) by mouth daily.  30 tablet  12    Review of Systems Review of Systems  Constitutional: Negative for diaphoresis, activity change, appetite change and unexpected weight change.  HENT: Negative for hearing loss, ear pain, facial swelling, mouth sores and neck stiffness.   Eyes: Negative for pain, redness and visual disturbance.  Respiratory: Negative for shortness of breath and wheezing.   Cardiovascular: Negative for chest pain and palpitations.  Gastrointestinal: Negative for diarrhea, blood in stool, abdominal distention and rectal pain.  Genitourinary: Negative for hematuria, flank  pain and decreased urine volume.  Musculoskeletal: Negative for myalgias and joint swelling.  Skin: Negative for color change and wound.  Neurological: Negative for syncope and numbness.  Hematological: Negative for adenopathy.  Psychiatric/Behavioral: Negative for hallucinations, self-injury, decreased concentration and agitation.      Objective:   Physical Exam BP 120/72  Pulse 74  Temp(Src) 97 F (36.1 C) (Oral)  Ht 6\' 1"  (1.854 m)  Wt 274 lb 4 oz (124.399 kg)  BMI 36.18 kg/m2  SpO2 97% Physical Exam  VS noted,. obese Constitutional: Pt is oriented to person, place, and time. Appears well-developed and  well-nourished.  HENT:  Head: Normocephalic and atraumatic.  Right Ear: External ear normal.  Left Ear: External ear normal.  Nose: Nose normal.  Mouth/Throat: Oropharynx is clear and moist.  Eyes: Conjunctivae and EOM are normal. Pupils are equal, round, and reactive to light.  Neck: Normal range of motion. Neck supple. No JVD present. No tracheal deviation present.  Cardiovascular: Normal rate, regular rhythm, normal heart sounds and intact distal pulses.   Pulmonary/Chest: Effort normal and breath sounds normal.  Abdominal: Soft. Bowel sounds are normal. There is no tenderness.  Musculoskeletal: Normal range of motion. Exhibits no edema.  Lymphadenopathy:  Has no cervical adenopathy.  Neurological: Pt is alert and oriented to person, place, and time. Pt has normal reflexes. No cranial nerve deficit., motor.sens/grip intact Skin: Skin is warm and dry. No rash noted.  Psychiatric:  Has  normal mood and affect. Behavior is normal.  1+ nervous midl tender right lateral epicondylar are noted, no swelling       Assessment & Plan:

## 2011-04-24 ENCOUNTER — Telehealth: Payer: Self-pay | Admitting: Internal Medicine

## 2011-04-24 MED ORDER — ATORVASTATIN CALCIUM 20 MG PO TABS: 20.0000 mg | ORAL_TABLET | Freq: Every day | ORAL | Status: DC

## 2011-04-24 NOTE — Telephone Encounter (Signed)
Labs reviewed;  LDL about 130, when should be at least < 100, better less than 70  Please start lipitor 20 qd  Please return for LAB only in 4 wks     Lipids 272.0                                                               Hepatic function panel   V58.69  Dahlia to please inform pt, and arrange future labs

## 2011-04-25 NOTE — Telephone Encounter (Signed)
Pt advised of results and scheduled for labs

## 2011-05-01 ENCOUNTER — Telehealth: Payer: Self-pay

## 2011-05-01 MED ORDER — NABUMETONE 500 MG PO TABS: 500.0000 mg | ORAL_TABLET | Freq: Two times a day (BID) | ORAL | Status: DC

## 2011-05-01 NOTE — Telephone Encounter (Signed)
Left message on machine for pt to return my call  

## 2011-05-01 NOTE — Telephone Encounter (Signed)
Not clear to me the diclofenac was the cause of his symtpoms, but:  Ok to change to nabumetone 500 bid prn

## 2011-05-01 NOTE — Telephone Encounter (Signed)
Pt called stating he has stopped taking Diclofenac. Pt says he took for the first time Friday and it caused SOB and fever. Pt is requesting alternative and for medication to be added to allergies. Okay to add. Pt states he is feeling well now.

## 2011-05-02 NOTE — Assessment & Plan Note (Signed)
[facility]                         GASTROENTEROLOGY OFFICE NOTE   NAME:SCHENKHemi, Chacko                        MRN:          [id number]  DATE:03/24/2008                            DOB:          [birth year]    Henry Ellis is an established patient of Dr. 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  He also has a chronic iron-deficiency anemia, which  has been evaluated by Dr. Corinda Gubler and has included endoscopy which has  shown Barrett's mucosa with this last endoscopy in May of 2007.  He has  had colonoscopy removal adenomatous colon polyps and is due for followup  on both of these tests at this time.  He recently did Hemoccult cards  which were all normal as were CBC and iron studies.  He had inner  capsule testing of his small intestine with interpretation by Dr. Stan Head in April of 2007 and this was unremarkable without any etiology  for the chronic blood loss determined.   Henry Ellis otherwise is in fairly good health except for some chronic  depression, some chronic mental status issues.   He is currently on:  1. Aciphex 20 mg a day.  2. Effexor XR 75 mg 3 a day.  3. Multivitamins and vitamin C daily.  4. B12 1000 mcg a day.  5. Metamucil daily.  6. Slow iron daily.  7. Levitra 20 mg p.r.n.  /  He does have a past history of chronic bronchitis and Bell's palsy.   On chart review, which is rather extensive, the patient has been treated  for IBS, constipation predominate and hypersomnolence.  He has had  nocturnal sleeping problems and does have nocturnal reflux without PPI  therapy.   SURGICAL HISTORY:  He has had bilateral inguinal hernia repairs.   SOCIAL HISTORY:  The patient had been a heavy smoker for many years, and  also had been a heavy user of alcohol.   FAMILY HISTORY:  Unremarkable in terms of any known gastrointestinal  problems.  He does have a  mother who has progressive supranuclear palsy.   PHYSICAL EXAMINATION:  Weight 277 pounds which is up 20 pounds from a  year ago.  Blood pressure 130/90 and pulse was 68 and regular.  General physical exam was not performed today.   ASSESSMENT AND PLAN:  1. Chronic gastroesophageal reflux disease with Barrett's mucosa and      need for followup endoscopy.  2. Would think this patient would benefit from small bowel biopsy to      exclude celiac disease.  3. Discontinue iron therapy at this time.  4. Follow up colonoscopy exam.  5. Reflux regime and will renew Aciphex therapy.  6. Continue other medications and followup with Dr. Jonny Ruiz as previously      planned.     Vania Rea. Jarold Motto, MD, Caleen Essex, FAGA  Electronically Signed    DRP/MedQ  DD: 03/24/2008  DT: 03/24/2008  Job #: [id number]   cc:   Corwin Levins, MD

## 2011-05-02 NOTE — Telephone Encounter (Signed)
Pt informed of MD's recommendation and Rx

## 2011-05-02 NOTE — Assessment & Plan Note (Signed)
[facility]                         GASTROENTEROLOGY OFFICE NOTE   NAME:SCHENKHans, Rusher                        MRN:          [id number]  DATE:11/19/2007                            DOB:          [birth year]    Mr. Deal is asymptomatic on Aciphex 20 mg a day.  He also takes daily  Metamucil for mild chronic constipation.  He has had previous GI workup  for Dr. Corinda Gubler for guaiac-positive stools and anemia and was found to  have Barrett's mucosa on endoscopy in May 2007, although I do not think  the patient has had followup since that time or been told that he had  Barrett's mucosa.  I cannot see where he has had repeat Hemoccult since  his initial exam.  He did have small-bowel capsule endoscopy exam on  April 06, 2006, which was unremarkable.   PHYSICAL EXAMINATION:  VITAL SIGNS:  Weight 276 pounds.  Blood pressure  140/80, pulse 80 and regular.  GENERAL:  A healthy-appearing white male in no distress.  I did not  perform general physical exam.   ASSESSMENT:  1. Chronic gastroesophageal reflux disease with Barrett's mucosa but      without dysplasia.  2. Chronic constipation treating with bulking agents.  3. Chronic anemia, and he currently continues on daily iron therapy.  4. Chronic depression on Effexor 75 mg 3 a day.   RECOMMENDATIONS:  1. Repeat Hemoccult cards.  If these are negative, I do not think he      needs to continue on chronic iron therapy.  2. I have spoken with the patient about Barrett's mucosa, its      etiology, and need for endoscopy at 3-year followup intervals.  3. Primary care followup with Dr. Jonny Ruiz as previously outlined.     Vania Rea. Jarold Motto, MD, Caleen Essex, FAGA  Electronically Signed    DRP/MedQ  DD: 11/19/2007  DT: 11/19/2007  Job #: [id number]   cc:   Corwin Levins, MD

## 2011-05-05 NOTE — Op Note (Signed)
NAME:  Henry Ellis, Henry Ellis NO.:  [id number]   MEDICAL RECORD NO.:  [id number]          PATIENT TYPE:  AMB   LOCATION:  ENDO                         FACILITY:  [facility]   PHYSICIAN:  Iva Boop, M.D. LHCDATE OF BIRTH:  [birth year]   DATE OF PROCEDURE:  04/06/2006  DATE OF DISCHARGE:  04/06/2006                                 OPERATIVE REPORT   OPERATION/PROCEDURE:  Small bowel capsule endoscopy.   REFERRING PHYSICIAN:  Ulyess Mort, M.D.   INDICATIONS:  Anemia with unrevealing colonoscopy February 2006.  EGD 2001  unrevealing.   PROCEDURE DATA:  Height 73 inches, weight 244 pounds, waist 41 inches,  stocky build.  Gastric passage time 17 minutes.  Small bowel passage time 4  hours 22 minutes.  These are normal.   FINDINGS:  1.  This is a completed study in which the capsule reached the colon.  2.  There are two areas of lymphangiectasia versus xanthoma in the small      bowel which are normal variants.  3.  There is a normal villous pattern.  4.  There is no cause of anemia identified.   RECOMMENDATIONS AND PLAN:  There is no explanation for anemia found on this  exam.  Plans per Dr. Victorino Dike.      Iva Boop, M.D. [facility]  Electronically Signed     CEG/MEDQ  D:  04/13/2006  T:  04/14/2006  Job:  [id number]   cc:   Ulyess Mort, M.D. [facility]  [address]  [place]  [place] [address]

## 2011-05-05 NOTE — Assessment & Plan Note (Signed)
[facility]                         GASTROENTEROLOGY OFFICE NOTE   NAME:[name], Henry Ellis                        MRN:          [id number]  DATE:11/09/2006                            DOB:          04/24/1956    [name] comes in and says he is doing fairly well.  He just needs some  refills.  Denies any problems.  Just medication refills.   His weight is 258.  Blood pressure is 114/70.  Pulse 60 and regular.  NECK, HEART, EXTREMITIES:  Are unremarkable.  RECTAL:  Deferred.   IMPRESSION:  1. Irritable bowel syndrome with constipation component.  2. Gastroesophageal reflux disease, controlled well with Acufex.  3. Anxiety/depression.  4. Status post colon polyps and minimal diverticulosis.   RECOMMENDATIONS:  Continue on his Aciphex.  I gave him refills for this.  __________ if he has any further difficulty.     Ulyess Mort, MD  Electronically Signed    SML/MedQ  DD: 11/09/2006  DT: 11/09/2006  Job #: [id number]

## 2011-05-26 ENCOUNTER — Other Ambulatory Visit (INDEPENDENT_AMBULATORY_CARE_PROVIDER_SITE_OTHER): Payer: 59

## 2011-05-26 DIAGNOSIS — E119 Type 2 diabetes mellitus without complications: Secondary | ICD-10-CM

## 2011-05-26 LAB — URINALYSIS
Hgb urine dipstick: NEGATIVE
Ketones, ur: NEGATIVE
Leukocytes, UA: NEGATIVE
Specific Gravity, Urine: 1.025 (ref 1.000–1.030)
Urobilinogen, UA: 0.2 (ref 0.0–1.0)

## 2011-05-26 LAB — MICROALBUMIN / CREATININE URINE RATIO: Creatinine,U: 123 mg/dL

## 2011-12-06 ENCOUNTER — Telehealth: Payer: Self-pay | Admitting: Internal Medicine

## 2011-12-06 NOTE — Telephone Encounter (Signed)
[facility] Fluvirin 0.5 ml LT arm IM Novatis 06.30.2013 [id number]

## 2012-03-29 ENCOUNTER — Other Ambulatory Visit (INDEPENDENT_AMBULATORY_CARE_PROVIDER_SITE_OTHER): Payer: 59

## 2012-03-29 ENCOUNTER — Encounter: Payer: Self-pay | Admitting: Internal Medicine

## 2012-03-29 ENCOUNTER — Ambulatory Visit (INDEPENDENT_AMBULATORY_CARE_PROVIDER_SITE_OTHER): Payer: 59 | Admitting: Internal Medicine

## 2012-03-29 ENCOUNTER — Ambulatory Visit (INDEPENDENT_AMBULATORY_CARE_PROVIDER_SITE_OTHER)
Admission: RE | Admit: 2012-03-29 | Discharge: 2012-03-29 | Disposition: A | Discharge status: Home / Self Care | Payer: 59 | Source: Ambulatory Visit | Attending: Internal Medicine | Admitting: Internal Medicine

## 2012-03-29 VITALS — BP 120/70 | HR 75 | Temp 97.6°F | Ht 73.0 in | Wt 283.4 lb

## 2012-03-29 DIAGNOSIS — R079 Chest pain, unspecified: Secondary | ICD-10-CM | POA: Insufficient documentation

## 2012-03-29 DIAGNOSIS — Z0000 Encounter for general adult medical examination without abnormal findings: Secondary | ICD-10-CM

## 2012-03-29 DIAGNOSIS — E119 Type 2 diabetes mellitus without complications: Secondary | ICD-10-CM

## 2012-03-29 DIAGNOSIS — L989 Disorder of the skin and subcutaneous tissue, unspecified: Secondary | ICD-10-CM | POA: Insufficient documentation

## 2012-03-29 LAB — HEMOGLOBIN A1C: Hgb A1c MFr Bld: 5.8 % (ref 4.6–6.5)

## 2012-03-29 LAB — BASIC METABOLIC PANEL
BUN: 15 mg/dL (ref 6–23)
Chloride: 105 mEq/L (ref 96–112)
GFR: 115.54 mL/min (ref >60.00)
Potassium: 4 mEq/L (ref 3.5–5.1)
Sodium: 141 mEq/L (ref 135–145)

## 2012-03-29 LAB — LIPID PANEL
Cholesterol: 146 mg/dL (ref 0–200)
LDL Cholesterol: 72 mg/dL (ref 0–99)
VLDL: 20.4 mg/dL (ref 0.0–40.0)

## 2012-03-29 NOTE — Assessment & Plan Note (Signed)
Aytypical with mult CRF's, ECG reviewed as per emr, also for cxr and stress test referral, Continue all other medications as before,  to f/u any worsening symptoms or concerns

## 2012-03-29 NOTE — Assessment & Plan Note (Signed)

## 2012-03-29 NOTE — Patient Instructions (Addendum)
Your EKG was ok today Please go to XRAY in the Basement for the x-ray test Please go to LAB in the Basement for the blood and/or urine tests to be done today Please keep your appointments with your specialists as you have planned - Dr Jarold Motto Continue all other medications as before Please have the pharmacy call with any refills you may need. You will be contacted regarding the referral for: Dermatology, and stress test Your refills were done as requested Please return in 6 mo with Lab testing done 3-5 days before

## 2012-03-30 ENCOUNTER — Encounter: Payer: Self-pay | Admitting: Internal Medicine

## 2012-03-30 NOTE — Assessment & Plan Note (Signed)
stable overall by hx and exam, most recent data reviewed with pt, and pt to continue medical treatment as before Lab Results  Component Value Date   HGBA1C 5.8 03/29/2012    

## 2012-03-30 NOTE — Progress Notes (Signed)
Subjective:    Patient ID: Henry Ellis, male    DOB: November 01, 1956, 56 y.o.   MRN: [id number]  HPI  Here for wellness and f/u;  Overall doing ok;  Pt denies worsening SOB, DOE, wheezing, orthopnea, PND, worsening LE edema, palpitations, dizziness or syncope, but has had intermittent mid anterior CP, pressure like, without radiaition, diaphoresis or assoc symptoms.  Does physical work for employment but chest wall nontender, nonpleuritic, and also nonexertional.    Pt denies neurological change such as new Headache, facial or extremity weakness.  Pt denies polydipsia, polyuria, or low sugar symptoms. Pt states overall good compliance with treatment and medications, good tolerability, and trying to follow lower cholesterol diet.  Pt denies worsening depressive symptoms, suicidal ideation or panic. No fever, wt loss, night sweats, loss of appetite, or other constitutional symptoms.  Pt states good ability with ADL's, low fall risk, home safety reviewed and adequate, no significant changes in hearing or vision, and occasionally active with exercise.  Also with mole to the right back enlarging, raised, itches in the past 2 months.  Has f/u GI appt in a few months Past Medical History  Diagnosis Date  . History of Bell's palsy   . Esophageal reflux   . ED (erectile dysfunction)   . Chronic bronchitis   . Barrett's esophagus   . Ulcerative esophagitis   . IBS (irritable bowel syndrome)   . Personal history of colonic polyps     adenomatous  . Anxiety   . Type II or unspecified type diabetes mellitus without mention of complication, not stated as uncontrolled   . Carpal tunnel syndrome, right 04/21/2011   Past Surgical History  Procedure Date  . Inguinal hernia repair     bilateral  . Knee arthroscopy     right Dr Thomasena Edis    reports that he has been smoking Cigarettes.  He has been smoking about .5 packs per day. He does not have any smokeless tobacco history on file. He reports that he drinks about  25.2 ounces of alcohol per week. He reports that he uses illicit drugs (Marijuana) about twice per week. family history includes Heart disease in his father; Hypertension in his brother; and Lung cancer in his father. Allergies  Allergen Reactions  . Amoxicillin   . Aspirin    Current Outpatient Prescriptions on File Prior to Visit  Medication Sig Dispense Refill  . Ascorbic Acid (VITAMIN C PO) Take 1 tablet by mouth daily.        Marland Kitchen atorvastatin (LIPITOR) 20 MG tablet Take 1 tablet (20 mg total) by mouth daily.  30 tablet  11  . Cholecalciferol (VITAMIN D3) 1000 UNITS CAPS Take 1 tablet by mouth daily.        . Ferrous Sulfate (FE-CAPS) 250 MG CPCR Take 1 capsule by mouth daily.        . Glucosamine HCl 1500 MG TABS Take 2 tablets by mouth daily.        . Multiple Vitamin (MULTIVITAMINS PO) Take 1 tablet by mouth daily.        . nabumetone (RELAFEN) 500 MG tablet Take 1 tablet (500 mg total) by mouth 2 (two) times daily.  60 tablet  5  . Psyllium (METAMUCIL) 30.9 % POWD Take by mouth daily.        . RABEprazole (ACIPHEX) 20 MG tablet Take 1 tablet (20 mg total) by mouth daily.  30 tablet  12  . vardenafil (LEVITRA) 20 MG tablet Take 1 tablet (  20 mg total) by mouth daily as needed.  10 tablet  5   Review of Systems Review of Systems  Constitutional: Negative for diaphoresis, activity change, appetite change and unexpected weight change.  HENT: Negative for hearing loss, ear pain, facial swelling, mouth sores and neck stiffness.   Eyes: Negative for pain, redness and visual disturbance.  Respiratory: Negative for shortness of breath and wheezing.   Cardiovascular: Negative for chest pain and palpitations. except for the above Gastrointestinal: Negative for diarrhea, blood in stool, abdominal distention and rectal pain.  Genitourinary: Negative for hematuria, flank pain and decreased urine volume.  Musculoskeletal: Negative for myalgias and joint swelling.  Skin: Negative for color change  and wound.  Neurological: Negative for syncope and numbness.  Hematological: Negative for adenopathy.  Psychiatric/Behavioral: Negative for hallucinations, self-injury, decreased concentration and agitation.      Objective:   Physical Exam BP 120/70  Pulse 75  Temp(Src) 97.6 F (36.4 C) (Oral)  Ht 6\' 1"  (1.854 m)  Wt 283 lb 6 oz (128.538 kg)  BMI 37.39 kg/m2  SpO2 96% Physical Exam  VS noted Constitutional: Pt is oriented to person, place, and time. Appears well-developed and well-nourished.  HENT:  Head: Normocephalic and atraumatic.  Right Ear: External ear normal.  Left Ear: External ear normal.  Nose: Nose normal.  Mouth/Throat: Oropharynx is clear and moist.  Eyes: Conjunctivae and EOM are normal. Pupils are equal, round, and reactive to light.  Neck: Normal range of motion. Neck supple. No JVD present. No tracheal deviation present.  Cardiovascular: Normal rate, regular rhythm, normal heart sounds and intact distal pulses.   Pulmonary/Chest: Effort normal and breath sounds normal.  Abdominal: Soft. Bowel sounds are normal. There is no tenderness.  Musculoskeletal: Normal range of motion. Exhibits no edema.  Lymphadenopathy:  Has no cervical adenopathy.  Neurological: Pt is alert and oriented to person, place, and time. Pt has normal reflexes. No cranial nerve deficit.  Skin: Skin is warm and dry. No rash noted. right back with 1 cm raised dark lesion, nontender Psychiatric:. Behavior is normal. 1+ nervous    Assessment & Plan:

## 2012-03-30 NOTE — Assessment & Plan Note (Signed)
Atypical, for dermatology referral,  to f/u any worsening symptoms or concerns

## 2012-04-02 ENCOUNTER — Other Ambulatory Visit: Payer: Self-pay | Admitting: Internal Medicine

## 2012-04-03 ENCOUNTER — Ambulatory Visit (HOSPITAL_COMMUNITY): Payer: 59 | Attending: Cardiovascular Disease | Admitting: Radiology

## 2012-04-03 ENCOUNTER — Encounter: Payer: Self-pay | Admitting: *Deleted

## 2012-04-03 DIAGNOSIS — R0789 Other chest pain: Secondary | ICD-10-CM | POA: Insufficient documentation

## 2012-04-03 DIAGNOSIS — F17200 Nicotine dependence, unspecified, uncomplicated: Secondary | ICD-10-CM | POA: Insufficient documentation

## 2012-04-03 DIAGNOSIS — E119 Type 2 diabetes mellitus without complications: Secondary | ICD-10-CM | POA: Insufficient documentation

## 2012-04-03 DIAGNOSIS — R0609 Other forms of dyspnea: Secondary | ICD-10-CM | POA: Insufficient documentation

## 2012-04-03 DIAGNOSIS — R079 Chest pain, unspecified: Secondary | ICD-10-CM

## 2012-04-03 DIAGNOSIS — R0989 Other specified symptoms and signs involving the circulatory and respiratory systems: Secondary | ICD-10-CM | POA: Insufficient documentation

## 2012-04-03 DIAGNOSIS — E785 Hyperlipidemia, unspecified: Secondary | ICD-10-CM | POA: Insufficient documentation

## 2012-04-03 DIAGNOSIS — R0602 Shortness of breath: Secondary | ICD-10-CM

## 2012-04-03 DIAGNOSIS — Z8249 Family history of ischemic heart disease and other diseases of the circulatory system: Secondary | ICD-10-CM | POA: Insufficient documentation

## 2012-04-03 MED ORDER — TECHNETIUM TC 99M TETROFOSMIN IV KIT
33.0000 | PACK | Freq: Once | INTRAVENOUS | Status: AC | PRN
  Administered 2012-04-03: 33 via INTRAVENOUS

## 2012-04-03 NOTE — Progress Notes (Signed)
[facility] SITE 3 NUCLEAR MED [address] [place] [address] [phone]  Cardiology Nuclear Med Study  Henry Ellis is a 56 y.o. male     MRN : [id number]     DOB: 1956/09/16  Procedure Date: 04/03/2012  Nuclear Med Background Indication for Stress Test:  Evaluation for Ischemia History:  Normal GXT > 10 yrs ago per patient Cardiac Risk Factors: Family History - CAD, Lipids, NIDDM and Smoker  Symptoms:  Chest Pain, Chest Pressure with Exertion (last date of chest discomfort this AM) and DOE   Nuclear Pre-Procedure Caffeine/Decaff Intake:  None NPO After: 8:00am   Lungs:  clear O2 Sat: 99% on room air. IV 0.9% NS with Angio Cath:  20g  IV Site: R Antecubital  IV Started by:  Bonnita Levan, RN  Chest Size (in):  52 Cup Size: n/a  Height: 6\' 1"  (1.854 m)  Weight:  278 lb (126.1 kg)  BMI:  Body mass index is 36.68 kg/(m^2). Tech Comments:  N/A    Nuclear Med Study 1 or 2 day study: 2 day  Stress Test Type:  Stress  Reading MD: Charlton Haws, MD  Order Authorizing Provider:   Oliver Barre, MD  Resting Radionuclide: Technetium 29m Tetrofosmin  Resting Radionuclide Dose: 32.5 mCi on 04/04/12   Stress Radionuclide:  Technetium 40m Tetrofosmin  Stress Radionuclide Dose: 33.0 mCi on 04/03/12           Stress Protocol Rest HR: 70 Stress HR: 160  Rest BP: 152/87 Stress BP: 197/81  Exercise Time (min): 6:00 METS: 7.0   Predicted Max HR: 163 bpm % Max HR: 98.16 bpm Rate Pressure Product: 59563   Dose of Adenosine (mg):  n/a Dose of Lexiscan: n/a mg  Dose of Atropine (mg): n/a Dose of Dobutamine: n/a mcg/kg/min (at max HR)  Stress Test Technologist: Bonnita Levan, RN  Nuclear Technologist:  Doyne Keel, CNMT     Rest Procedure:  Myocardial perfusion imaging was performed at rest 45 minutes following the intravenous administration of Technetium 47m Tetrofosmin. Rest ECG: NSR - Normal EKG  Stress Procedure:  The patient performed treadmill exercise using a  Bruce  Protocol for 6:00 minutes. The patient stopped due to fatigue and dyspnea and denied any chest pain.  There were no significant ST-T wave changes.  Technetium 60m Tetrofosmin was injected at peak exercise and myocardial perfusion imaging was performed after a brief delay. Stress ECG: No significant change from baseline ECG  QPS Raw Data Images:  Patient motion noted; appropriate software correction applied. Stress Images:  Normal homogeneous uptake in all areas of the myocardium. Rest Images:  Normal homogeneous uptake in all areas of the myocardium. Subtraction (SDS):  No evidence of ischemia. Transient Ischemic Dilatation (Normal <1.22):  0.84 Lung/Heart Ratio (Normal <0.45):  0.31  Quantitative Gated Spect Images QGS EDV:  141 ml QGS ESV:  46 ml  Impression Exercise Capacity:  Fair exercise tolerance BP Response:  Normal blood pressure response. Clinical Symptoms:  Shortness of breath ECG Impression:  No significant ST segment change suggestive of ischemia. Comparison with Prior Nuclear Study: No previous nuclear study performed  Overall Impression:  Normal stress nuclear study.  LV Ejection Fraction: 67%.  LV Wall Motion:  Normal Wall Motion  Willa Rough, MD

## 2012-04-04 ENCOUNTER — Ambulatory Visit (HOSPITAL_COMMUNITY): Payer: 59 | Attending: Cardiology

## 2012-04-04 DIAGNOSIS — R0989 Other specified symptoms and signs involving the circulatory and respiratory systems: Secondary | ICD-10-CM

## 2012-04-04 MED ORDER — TECHNETIUM TC 99M TETROFOSMIN IV KIT
33.0000 | PACK | Freq: Once | INTRAVENOUS | Status: AC | PRN
  Administered 2012-04-04: 33 via INTRAVENOUS

## 2012-04-05 ENCOUNTER — Encounter: Payer: Self-pay | Admitting: Internal Medicine

## 2012-04-08 ENCOUNTER — Encounter: Payer: Self-pay | Admitting: Gastroenterology

## 2012-04-08 ENCOUNTER — Ambulatory Visit (INDEPENDENT_AMBULATORY_CARE_PROVIDER_SITE_OTHER): Payer: 59 | Admitting: Gastroenterology

## 2012-04-08 VITALS — BP 144/80 | HR 68 | Ht 73.0 in | Wt 283.0 lb

## 2012-04-08 DIAGNOSIS — Z86010 Personal history of colonic polyps: Secondary | ICD-10-CM

## 2012-04-08 DIAGNOSIS — K219 Gastro-esophageal reflux disease without esophagitis: Secondary | ICD-10-CM

## 2012-04-08 DIAGNOSIS — K2270 Barrett's esophagus without dysplasia: Secondary | ICD-10-CM

## 2012-04-08 MED ORDER — RABEPRAZOLE SODIUM 20 MG PO TBEC: 20.0000 mg | DELAYED_RELEASE_TABLET | Freq: Two times a day (BID) | ORAL | Status: DC

## 2012-04-08 NOTE — Progress Notes (Signed)
History of Present Illness:  This is a 56 year old Caucasian male with chronic acid reflux and Barrett's mucosa with last endoscopy 5 years ago. He is on daily AcipHex 20 mg, but has continued breakthrough symptomatology with recent solid food dysphagia. He also has a history of recurrent colon polyps with last colonoscopy 5 years ago. His chronic constipation is managed by daily Metamucil. His care of been complicated by iron deficiency anemia of unexplained etiology with a previous pill camera exam showing nonspecific findings. He recently has restarted oral iron therapy per primary care. Review of his labs one year ago showed mild iron deficiency, but no significant anemia. Patient denies current cardiovascular or pulmonary complaints. His appetite is good and his weight is stable. He does take Relafen 500 mg twice a day for DJD.    I have reviewed this patient's present history, medical and surgical past history, allergies and medications.     ROS: The remainder of the 10 point ROS is negative     Physical Exam: Blood pressure 144/80, pulse 60 and regular, weight 283 pounds, BMI 37.34. General well developed well nourished patient in no acute distress, appearing his stated age Eyes PERRLA, no icterus, fundoscopic exam per opthamologist Skin no lesions noted Neck supple, no adenopathy, no thyroid enlargement, no tenderness Chest clear to percussion and auscultation Heart no significant murmurs, gallops or rubs noted Abdomen no hepatosplenomegaly masses or tenderness, BS normal.  Extremities no acute joint lesions, edema, phlebitis or evidence of cellulitis. Neurologic patient oriented x 3, cranial nerves intact, no focal neurologic deficits noted. Psychological mental status normal and normal affect.  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 On reviewing his laboratory biopsies, there is some discrepancy as to whether or not he actually has  Barrett's mucosa. In any case, he needs repeat endoscopy, biopsies, and esophageal dilatation. This has been scheduled at his convenience also with colonoscopy because of his history of colon polyps and chronic iron deficiency. He will be repeat CBC, metabolic panel, and anemia profile with his yearly physical exam with Dr. Melvyn Novas  Encounter Diagnoses  Name Primary?  . Esophageal reflux Yes  . Personal history of colonic polyps   . Barrett's esophagus

## 2012-04-08 NOTE — Patient Instructions (Addendum)
Increase your Aciphex to twice a day, a new rx has been sent to your pharmacy. Your procedure has been scheduled for 04/19/2012, please follow the seperate instructions.

## 2012-04-14 ENCOUNTER — Encounter: Payer: Self-pay | Admitting: Internal Medicine

## 2012-04-15 ENCOUNTER — Other Ambulatory Visit: Payer: Self-pay | Admitting: Gastroenterology

## 2012-04-15 MED ORDER — MOVIPREP 100 G PO SOLR: 1.0000 | Freq: Once | ORAL | Status: DC

## 2012-04-15 NOTE — Telephone Encounter (Signed)
rx sent, pt aware 

## 2012-04-19 ENCOUNTER — Encounter: Payer: 59 | Admitting: Gastroenterology

## 2012-04-22 ENCOUNTER — Ambulatory Visit (AMBULATORY_SURGERY_CENTER): Payer: 59 | Admitting: Gastroenterology

## 2012-04-22 ENCOUNTER — Encounter: Payer: Self-pay | Admitting: Gastroenterology

## 2012-04-22 VITALS — BP 138/72 | HR 69 | Temp 98.7°F | Resp 16 | Ht 73.0 in | Wt 283.0 lb

## 2012-04-22 DIAGNOSIS — D126 Benign neoplasm of colon, unspecified: Secondary | ICD-10-CM

## 2012-04-22 DIAGNOSIS — K219 Gastro-esophageal reflux disease without esophagitis: Secondary | ICD-10-CM

## 2012-04-22 DIAGNOSIS — R1319 Other dysphagia: Secondary | ICD-10-CM

## 2012-04-22 DIAGNOSIS — Z860100 Personal history of colon polyps, unspecified: Secondary | ICD-10-CM

## 2012-04-22 DIAGNOSIS — R1310 Dysphagia, unspecified: Secondary | ICD-10-CM

## 2012-04-22 DIAGNOSIS — K5730 Diverticulosis of large intestine without perforation or abscess without bleeding: Secondary | ICD-10-CM

## 2012-04-22 DIAGNOSIS — R1314 Dysphagia, pharyngoesophageal phase: Secondary | ICD-10-CM

## 2012-04-22 DIAGNOSIS — R079 Chest pain, unspecified: Secondary | ICD-10-CM

## 2012-04-22 DIAGNOSIS — K644 Residual hemorrhoidal skin tags: Secondary | ICD-10-CM

## 2012-04-22 DIAGNOSIS — K2270 Barrett's esophagus without dysplasia: Secondary | ICD-10-CM

## 2012-04-22 MED ORDER — SODIUM CHLORIDE 0.9 % IV SOLN: 500.0000 mL | INTRAVENOUS | Status: DC

## 2012-04-22 NOTE — Op Note (Signed)
[facility] [address] [place], [place]  [address]  COLONOSCOPY PROCEDURE REPORT  PATIENT:  Henry Ellis, Henry Ellis  MR#:  [id number] BIRTHDATE:  02/03/1956, 55 yrs. old  GENDER:  male ENDOSCOPIST:  Vania Rea. Jarold Motto, MD, [facility] REF. BY: PROCEDURE DATE:  04/22/2012 PROCEDURE:  Colonoscopy with biopsy ASA CLASS:  Class II INDICATIONS:  Iron deficiency anemia, history of polyps MEDICATIONS:   propofol (Diprivan) 300 mg IV  DESCRIPTION OF PROCEDURE:   After the risks and benefits and of the procedure were explained, informed consent was obtained. Digital rectal exam was performed and revealed perianal skin tags. The LB CF-[id number] endoscope was introduced through the anus and advanced to the cecum, which was identified by both the appendix and ileocecal valve.  The quality of the prep was excellent, using MoviPrep.  The instrument was then slowly withdrawn as the colon was fully examined. <<PROCEDUREIMAGES>>  FINDINGS:  There were mild diverticular changes in left colon. diverticulosis was found.  A sessile polyp was found in the rectum. 5 mm flat polyp cold biopsy removed.  External Hemorrhoids were found. fresh bleeding at start of exam noted.!!! Retroflexed views in the rectum revealed external hemorrhoids. The scope was then withdrawn from the patient and the procedure completed.  COMPLICATIONS:  None ENDOSCOPIC IMPRESSION: 1) Diverticulosis,mild,left sided diverticulosis 2) Sessile polyp in the rectum 3) External hemorrhoids 4) External hemorrhoids 1.R/O ADENOMA 2.BLEEDING HEMORRHOIDS 3.DIVERTICULOSIS RECOMMENDATIONS: 1) Await biopsy results 2) Repeat colonoscopy in 5 years if polyp adenomatous; otherwise 10 years 3) High fiber diet.  REPEAT EXAM:  No  ______________________________ Vania Rea. Jarold Motto, MD, [facility]  CC:  Corwin Levins, MD  n. Rosalie DoctorMarland Kitchen   Vania Rea. Hakop Humbarger at 04/22/2012 02:38 PM  [place], [phone]

## 2012-04-22 NOTE — Progress Notes (Signed)
Patient did not experience any of the following events: a burn prior to discharge; a fall within the facility; wrong site/side/patient/procedure/implant event; or a hospital transfer or hospital admission upon discharge from the facility. (G8907) Patient did not have preoperative order for IV antibiotic SSI prophylaxis. (G8918)  

## 2012-04-22 NOTE — Patient Instructions (Signed)
SEE DILATION DIET  YOU HAD AN ENDOSCOPIC PROCEDURE TODAY AT THE [facility]: Refer to the procedure report that was given to you for any specific questions about what was found during the examination.  If the procedure report does not answer your questions, please call your gastroenterologist to clarify.  If you requested that your care partner not be given the details of your procedure findings, then the procedure report has been included in a sealed envelope for you to review at your convenience later.  YOU SHOULD EXPECT: Some feelings of bloating in the abdomen. Passage of more gas than usual.  Walking can help get rid of the air that was put into your GI tract during the procedure and reduce the bloating. If you had a lower endoscopy (such as a colonoscopy or flexible sigmoidoscopy) you may notice spotting of blood in your stool or on the toilet paper. If you underwent a bowel prep for your procedure, then you may not have a normal bowel movement for a few days.  DIET: Your first meal following the procedure should be a light meal and then it is ok to progress to your normal diet.  A half-sandwich or bowl of soup is an example of a good first meal.  Heavy or fried foods are harder to digest and may make you feel nauseous or bloated.  Likewise meals heavy in dairy and vegetables can cause extra gas to form and this can also increase the bloating.  Drink plenty of fluids but you should avoid alcoholic beverages for 24 hours.  ACTIVITY: Your care partner should take you home directly after the procedure.  You should plan to take it easy, moving slowly for the rest of the day.  You can resume normal activity the day after the procedure however you should NOT DRIVE or use heavy machinery for 24 hours (because of the sedation medicines used during the test).    SYMPTOMS TO REPORT IMMEDIATELY: A gastroenterologist can be reached at any hour.  During normal business hours, 8:30 AM to 5:00 PM  Monday through Friday, call [phone].  After hours and on weekends, please call the GI answering service at [phone] who will take a message and have the physician on call contact you.   Following lower endoscopy (colonoscopy or flexible sigmoidoscopy):  Excessive amounts of blood in the stool  Significant tenderness or worsening of abdominal pains  Swelling of the abdomen that is new, acute  Fever of 100F or higher  Following upper endoscopy (EGD)  Vomiting of blood or coffee ground material  New chest pain or pain under the shoulder blades  Painful or persistently difficult swallowing  New shortness of breath  Fever of 100F or higher  Black, tarry-looking stools  FOLLOW UP: If any biopsies were taken you will be contacted by phone or by letter within the next 1-3 weeks.  Call your gastroenterologist if you have not heard about the biopsies in 3 weeks.  Our staff will call the home number listed on your records the next business day following your procedure to check on you and address any questions or concerns that you may have at that time regarding the information given to you following your procedure. This is a courtesy call and so if there is no answer at the home number and we have not heard from you through the emergency physician on call, we will assume that you have returned to your regular daily activities without incident.  SIGNATURES/CONFIDENTIALITY:  You and/or your care partner have signed paperwork which will be entered into your electronic medical record.  These signatures attest to the fact that that the information above on your After Visit Summary has been reviewed and is understood.  Full responsibility of the confidentiality of this discharge information lies with you and/or your care-partner.

## 2012-04-22 NOTE — Op Note (Signed)
[facility] [address] [place], [place]  [address]  ENDOSCOPY PROCEDURE REPORT  PATIENT:  Henry Ellis, Henry Ellis  MR#:  [id number] BIRTHDATE:  [birth year], 55 yrs. old  GENDER:  male  ENDOSCOPIST:  Vania Rea. Jarold Motto, MD, [facility] Referred by:  PROCEDURE DATE:  04/22/2012 PROCEDURE:  EGD, diagnostic 43235, Maloney Dilation of Esophagus ASA CLASS:  Class II INDICATIONS:  GERD, dysphagia  MEDICATIONS:   There was residual sedation effect present from prior procedure., propofol (Diprivan) 150 mg IV TOPICAL ANESTHETIC:  DESCRIPTION OF PROCEDURE:   After the risks and benefits of the procedure were explained, informed consent was obtained.  The LB GIF-H180 [id number] endoscope was introduced through the mouth and advanced to the second portion of the duodenum.  The instrument was slowly withdrawn as the mucosa was fully examined. <<PROCEDUREIMAGES>>  A hiatal hernia was found. 5 CM. HIATIAL HERNIA NOTED AND #33F MALONEY DILATOR PASSED.NO HEME OR PAIN.  irregular Z-line. Otherwise the examination was normal.    Retroflexed views revealed a hiatal hernia.    The scope was then withdrawn from the patient and the procedure completed.  COMPLICATIONS:  None  ENDOSCOPIC IMPRESSION: 1) Hiatal hernia 2) Irregular Z-line 3) Otherwise normal examination CHRONIC GERD AND OCCULT STRICTURE DILATED. RECOMMENDATIONS: 1) continue PPI 2) dilatations PRN  ______________________________ Vania Rea. Jarold Motto, MD, [facility]  CC:  Karie Schwalbe, MD  n. Rosalie DoctorMarland Kitchen   Vania Rea. Jamye Balicki at 04/22/2012 02:42 PM  Golda Acre, [phone]

## 2012-04-24 ENCOUNTER — Telehealth: Payer: Self-pay | Admitting: *Deleted

## 2012-04-24 NOTE — Telephone Encounter (Signed)
Left message on number given in admitting Monday. ewm

## 2012-04-26 ENCOUNTER — Encounter: Payer: Self-pay | Admitting: Gastroenterology

## 2012-05-03 ENCOUNTER — Other Ambulatory Visit: Payer: Self-pay | Admitting: Internal Medicine

## 2012-05-15 ENCOUNTER — Encounter: Payer: Self-pay | Admitting: *Deleted

## 2012-05-16 ENCOUNTER — Encounter: Payer: Self-pay | Admitting: Gastroenterology

## 2012-05-16 ENCOUNTER — Ambulatory Visit (INDEPENDENT_AMBULATORY_CARE_PROVIDER_SITE_OTHER): Payer: 59 | Admitting: Gastroenterology

## 2012-05-16 VITALS — BP 150/86 | HR 60 | Ht 73.0 in | Wt 282.2 lb

## 2012-05-16 DIAGNOSIS — Z860100 Personal history of colon polyps, unspecified: Secondary | ICD-10-CM

## 2012-05-16 DIAGNOSIS — E611 Iron deficiency: Secondary | ICD-10-CM

## 2012-05-16 DIAGNOSIS — K644 Residual hemorrhoidal skin tags: Secondary | ICD-10-CM

## 2012-05-16 DIAGNOSIS — K219 Gastro-esophageal reflux disease without esophagitis: Secondary | ICD-10-CM

## 2012-05-16 DIAGNOSIS — K222 Esophageal obstruction: Secondary | ICD-10-CM

## 2012-05-16 NOTE — Patient Instructions (Signed)
You will need a follow up in one year.  Dr Jarold Motto has ordered labs to be done the same time you have your labs done for Dr Jonny Ruiz, 09/20/2012

## 2012-05-16 NOTE — Progress Notes (Signed)
History of Present Illness: This is a very pleasant 56 year old Caucasian male with chronic iron deficiency anemia of unexplained etiology. His chronic acid reflux is managed with AcipHex 20 mg a day. Recent endoscopy showed no evidence of Barrett's mucosa. He is completely asymptomatic in terms of any gastrointestinal problems at this time. He follows a high fiber diet with daily Metamucil, and is on ferrous sulfate 250 mg a day. He did have a small  adenoma in his rectum removed at the time of colonoscopy, and is scheduled for a five-year followup. At the time of endoscopy he underwent esophageal dilatation for suspected esophageal stricture, and is currently asymptomatic without dysphagia or acid reflux. Colonoscopy did reveal external hemorrhoids.    Current Medications, Allergies, Past Medical History, Past Surgical History, Family History and Social History were reviewed in [employer] record.   Assessment and plan: Review of his labs shows stable CBC and ferritin level. We will put these labs in for repeat at the time of his CPX with Dr. Oliver Barre in October. I reviewed her reflux regime with this patient and will continue AcipHex 20 mg a day. He should also have IFOB stool cards at the time of his CPX exam. Encounter Diagnosis  Name Primary?  . Esophageal reflux Yes

## 2012-06-05 ENCOUNTER — Encounter: Payer: Self-pay | Admitting: Internal Medicine

## 2012-06-05 ENCOUNTER — Ambulatory Visit (INDEPENDENT_AMBULATORY_CARE_PROVIDER_SITE_OTHER): Payer: 59 | Admitting: Internal Medicine

## 2012-06-05 VITALS — BP 140/82 | HR 72 | Temp 97.0°F | Ht 73.0 in | Wt 279.5 lb

## 2012-06-05 DIAGNOSIS — E119 Type 2 diabetes mellitus without complications: Secondary | ICD-10-CM

## 2012-06-05 DIAGNOSIS — Z86010 Personal history of colonic polyps: Secondary | ICD-10-CM

## 2012-06-05 DIAGNOSIS — M25519 Pain in unspecified shoulder: Secondary | ICD-10-CM

## 2012-06-05 DIAGNOSIS — Z860101 Personal history of adenomatous and serrated colon polyps: Secondary | ICD-10-CM

## 2012-06-05 DIAGNOSIS — M25511 Pain in right shoulder: Secondary | ICD-10-CM

## 2012-06-05 DIAGNOSIS — E7800 Pure hypercholesterolemia, unspecified: Secondary | ICD-10-CM

## 2012-06-05 DIAGNOSIS — N529 Male erectile dysfunction, unspecified: Secondary | ICD-10-CM

## 2012-06-05 HISTORY — DX: Personal history of colonic polyps: Z86.010

## 2012-06-05 HISTORY — DX: Personal history of adenomatous and serrated colon polyps: Z86.0101

## 2012-06-05 MED ORDER — NABUMETONE 500 MG PO TABS: 500.0000 mg | ORAL_TABLET | Freq: Two times a day (BID) | ORAL | Status: DC

## 2012-06-05 MED ORDER — VARDENAFIL HCL 20 MG PO TABS: 20.0000 mg | ORAL_TABLET | Freq: Every day | ORAL | Status: DC | PRN

## 2012-06-05 NOTE — Patient Instructions (Addendum)
OK to re-start the relafen as needed for pain You will be contacted regarding the referral for: orthopedic Continue all other medications as before Your levitra was refilled as well Please have the pharmacy call with any other refills you may need.

## 2012-06-05 NOTE — Assessment & Plan Note (Signed)
stable overall by hx and exam, most recent data reviewed with pt, and pt to continue medical treatment as before Lab Results  Component Value Date   LDLCALC 72 03/29/2012   To cont lower chol/DM diet

## 2012-06-05 NOTE — Assessment & Plan Note (Signed)
stable overall by hx and exam, most recent data reviewed with pt, and pt to continue medical treatment as before Lab Results  Component Value Date   HGBA1C 5.8 03/29/2012    

## 2012-06-05 NOTE — Progress Notes (Signed)
Subjective:    Patient ID: Henry Ellis, male    DOB: Aug 26, 1956, 56 y.o.   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  No neck pain, radicalar pain/weak/numb. Nothing else makes better or worse except better to rest and not move it.  Worse to lie on it at night. No trauma.  Did have recent stress test negative April 2013,  Did have adenomatous polyp removed with recent colonscopy as well, with f/u at 5 yrs recommended,  Still with ED symtpoms ongoing, levitra has worked well in the past with out side effect, requests refill.   Pt denies polydipsia, polyuria,   Pt states overall good compliance with meds, trying to follow lower cholesterol, diabetic diet, wt overall stable.  Working on trying to become more active, just not done anything more yet.   Past Medical History  Diagnosis Date  . History of Bell's palsy   . Esophageal reflux   . ED (erectile dysfunction)   . Chronic bronchitis   . Barrett's esophagus   . Ulcerative esophagitis   . IBS (irritable bowel syndrome)   . Personal history of colonic polyps 04/15/2008 & 04/22/2012    TUBULAR ADENOMA  . Anxiety   . Type II or unspecified type diabetes mellitus without mention of complication, not stated as uncontrolled   . Carpal tunnel syndrome, right 04/21/2011  . Hiatal hernia   . Esophageal stricture   . H/O adenomatous polyp of colon 06/05/2012   Past Surgical History  Procedure Date  . Inguinal hernia repair     bilateral  . Knee arthroscopy     right Dr Thomasena Edis  . Carpal tunnel release     left    reports that he has been smoking Cigarettes.  He has been smoking about .5 packs per day. He has never  used smokeless tobacco. He reports that he drinks alcohol. He reports that he uses illicit drugs (Marijuana) about twice per week. family history includes Heart disease in his father; Hypertension in his brother; and Lung cancer in his father.  There is no history of Colon cancer. Allergies  Allergen Reactions  . Bee Venom Anaphylaxis  . Aspirin Nausea Only  . Amoxicillin Rash   Current Outpatient Prescriptions on File Prior to Visit  Medication Sig Dispense Refill  . Ascorbic Acid (VITAMIN C PO) Take 1 tablet by mouth daily.        Marland Kitchen atorvastatin (LIPITOR) 20 MG tablet TAKE 1 TABLET BY MOUTH DAILY  30 tablet  11  . Cholecalciferol (VITAMIN D3) 1000 UNITS CAPS Take 1 tablet by mouth daily.        . Ferrous Sulfate (FE-CAPS) 250 MG CPCR Take 1 capsule by mouth daily.        . Glucosamine HCl 1500 MG TABS Take 2 tablets by mouth daily.        . Multiple Vitamin (MULTIVITAMINS PO) Take 1 tablet by mouth daily.        . Psyllium (METAMUCIL) 30.9 % POWD Take by mouth daily.        . RABEprazole (ACIPHEX) 20 MG tablet Take 1 tablet (  20 mg total) by mouth 2 (two) times daily.  60 tablet  11  . DISCONTD: LEVITRA 20 MG tablet TAKE 1 TABLET BY MOUTH EVERY DAY AS NEEDED  10 tablet  1   Review of Systems Review of Systems  Constitutional: Negative for diaphoresis and unexpected weight change.  Eyes: Negative for photophobia and visual disturbance.  Respiratory: Negative for choking and stridor.   Gastrointestinal: Negative for vomiting and blood in stool.  Genitourinary: Negative for hematuria and decreased urine volume.  Musculoskeletal: Negative for gait problem.  Skin: Negative for color change and wound.  Neurological: Negative for tremors and numbness.     Objective:   Physical Exam BP 140/82  Pulse 72  Temp 97 F (36.1 C) (Oral)  Ht 6\' 1"  (1.854 m)  Wt 279 lb 8 oz (126.78 kg)  BMI 36.88 kg/m2  SpO2 98% Physical Exam  VS noted Constitutional: Pt appears well-developed and  well-nourished.  HENT: Head: Normocephalic.  Right Ear: External ear normal.  Left Ear: External ear normal.  Eyes: Conjunctivae and EOM are normal. Pupils are equal, round, and reactive to light.  Neck: Normal range of motion. Neck supple.  Cardiovascular: Normal rate and regular rhythm.   Pulmonary/Chest: Effort normal and breath sounds normal.  Abd:  Soft, NT, non-distended, + BS Neurological: Pt is alert. No cranial nerve deficit. motor/sens/dtr intact to UE's Skin: Skin is warm. No erythema.  Psychiatric: Pt behavior is normal. Thought content normal. 1+ nervous Right shoulder with tender bicipital insertion site and post rot cuff area with pain elicited on passive ROM with forward elev and abduction,  Spine: nontender, trapezoid nontender    Assessment & Plan:

## 2012-06-05 NOTE — Assessment & Plan Note (Signed)
Suspect bicep tendonitis/rot cuff tendoniitis - for ortho referral, re=start the relafen prn,  to f/u any worsening symptoms or concerns

## 2012-06-05 NOTE — Assessment & Plan Note (Signed)
stable overall by hx and exam,, and pt to continue medical treatment as before, for refill levitra

## 2012-09-20 ENCOUNTER — Other Ambulatory Visit (INDEPENDENT_AMBULATORY_CARE_PROVIDER_SITE_OTHER): Payer: 59

## 2012-09-20 ENCOUNTER — Telehealth: Payer: Self-pay

## 2012-09-20 DIAGNOSIS — E119 Type 2 diabetes mellitus without complications: Secondary | ICD-10-CM

## 2012-09-20 DIAGNOSIS — K219 Gastro-esophageal reflux disease without esophagitis: Secondary | ICD-10-CM

## 2012-09-20 LAB — BASIC METABOLIC PANEL
CO2: 30 mEq/L (ref 19–32)
Calcium: 8.6 mg/dL (ref 8.4–10.5)
Glucose, Bld: 172 mg/dL — ABNORMAL HIGH (ref 70–99)
Potassium: 3.6 mEq/L (ref 3.5–5.1)
Sodium: 141 mEq/L (ref 135–145)

## 2012-09-20 LAB — IBC PANEL
Iron: 105 ug/dL (ref 42–165)
Saturation Ratios: 29.7 % (ref 20.0–50.0)
Transferrin: 252.4 mg/dL (ref 212.0–360.0)

## 2012-09-20 LAB — HEMOGLOBIN A1C: Hgb A1c MFr Bld: 5.7 % (ref 4.6–6.5)

## 2012-09-20 LAB — VITAMIN B12: Vitamin B-12: 569 pg/mL (ref 211–911)

## 2012-09-20 LAB — LIPID PANEL: Total CHOL/HDL Ratio: 2

## 2012-09-20 LAB — FOLATE: Folate: >24.8 ng/mL (ref >5.9)

## 2012-09-20 NOTE — Telephone Encounter (Signed)
Put lab order in. 

## 2012-09-27 ENCOUNTER — Ambulatory Visit (INDEPENDENT_AMBULATORY_CARE_PROVIDER_SITE_OTHER): Payer: 59 | Admitting: Internal Medicine

## 2012-09-27 ENCOUNTER — Encounter: Payer: Self-pay | Admitting: Internal Medicine

## 2012-09-27 VITALS — BP 122/70 | HR 76 | Temp 97.3°F | Ht 73.0 in | Wt 276.5 lb

## 2012-09-27 DIAGNOSIS — M1711 Unilateral primary osteoarthritis, right knee: Secondary | ICD-10-CM

## 2012-09-27 DIAGNOSIS — Z23 Encounter for immunization: Secondary | ICD-10-CM

## 2012-09-27 DIAGNOSIS — Z0000 Encounter for general adult medical examination without abnormal findings: Secondary | ICD-10-CM

## 2012-09-27 DIAGNOSIS — F3289 Other specified depressive episodes: Secondary | ICD-10-CM

## 2012-09-27 DIAGNOSIS — E119 Type 2 diabetes mellitus without complications: Secondary | ICD-10-CM

## 2012-09-27 DIAGNOSIS — E7800 Pure hypercholesterolemia, unspecified: Secondary | ICD-10-CM

## 2012-09-27 DIAGNOSIS — IMO0002 Reserved for concepts with insufficient information to code with codable children: Secondary | ICD-10-CM

## 2012-09-27 DIAGNOSIS — M1710 Unilateral primary osteoarthritis, unspecified knee: Secondary | ICD-10-CM

## 2012-09-27 DIAGNOSIS — F329 Major depressive disorder, single episode, unspecified: Secondary | ICD-10-CM

## 2012-09-27 NOTE — Patient Instructions (Addendum)
You had the flu shot today Your relafen was refilled today Please call if you need the referral back to [facility] Continue all other medications as before Please have the pharmacy call with any other refills you may need. Please continue your efforts at being more active, low cholesterol diet, and weight control  - to try to lose more weight Please remember to sign up for My Chart at your earliest convenience, as this will be important to you in the future with finding out test results. Please stop smoking Please return in 6 mo with Lab testing done 3-5 days before

## 2012-09-28 ENCOUNTER — Encounter: Payer: Self-pay | Admitting: Internal Medicine

## 2012-09-28 DIAGNOSIS — M1711 Unilateral primary osteoarthritis, right knee: Secondary | ICD-10-CM | POA: Insufficient documentation

## 2012-09-28 NOTE — Assessment & Plan Note (Signed)
stable overall by hx and exam, most recent data reviewed with pt, and pt to continue medical treatment as before Lab Results  Component Value Date   WBC 6.7 04/21/2011   HGB 13.2 04/21/2011   HCT 37.9* 04/21/2011   PLT 246.0 04/21/2011   GLUCOSE 172* 09/20/2012   CHOL 139 09/20/2012   TRIG 151.0* 09/20/2012   HDL 61.70 09/20/2012   LDLDIRECT 151.7 03/25/2010   LDLCALC 47 09/20/2012   ALT 20 03/17/2011   AST 16 03/17/2011   NA 141 09/20/2012   K 3.6 09/20/2012   CL 104 09/20/2012   CREATININE 0.8 09/20/2012   BUN 13 09/20/2012   CO2 30 09/20/2012   TSH 0.40 04/21/2011   PSA 1.80 03/29/2012   HGBA1C 5.7 09/20/2012   MICROALBUR 0.6 05/26/2011   For nsiad bid prn refill,  to f/u any worsening symptoms or concerns

## 2012-09-28 NOTE — Assessment & Plan Note (Signed)
stable overall by hx and exam, most recent data reviewed with pt, and pt to continue medical treatment as before Lab Results  Component Value Date   LDLCALC 47 09/20/2012

## 2012-09-28 NOTE — Assessment & Plan Note (Signed)
stable overall by hx and exam, most recent data reviewed with pt, and pt to continue medical treatment as before Lab Results  Component Value Date   WBC 6.7 04/21/2011   HGB 13.2 04/21/2011   HCT 37.9* 04/21/2011   PLT 246.0 04/21/2011   GLUCOSE 172* 09/20/2012   CHOL 139 09/20/2012   TRIG 151.0* 09/20/2012   HDL 61.70 09/20/2012   LDLDIRECT 151.7 03/25/2010   LDLCALC 47 09/20/2012   ALT 20 03/17/2011   AST 16 03/17/2011   NA 141 09/20/2012   K 3.6 09/20/2012   CL 104 09/20/2012   CREATININE 0.8 09/20/2012   BUN 13 09/20/2012   CO2 30 09/20/2012   TSH 0.40 04/21/2011   PSA 1.80 03/29/2012   HGBA1C 5.7 09/20/2012   MICROALBUR 0.6 05/26/2011

## 2012-09-28 NOTE — Progress Notes (Signed)
Subjective:    Patient ID: Henry Ellis, male    DOB: Jul 08, 1956, 56 y.o.   MRN: [id number]  HPI  Here to f/u; overall doing ok,  Pt denies chest pain, increased sob or doe, wheezing, orthopnea, PND, increased LE swelling, palpitations, dizziness or syncope.  Pt denies new neurological symptoms such as new headache, or facial or extremity weakness or numbness   Pt denies polydipsia, polyuria, or low sugar symptoms such as weakness or confusion improved with po intake.  Pt states overall good compliance with meds, trying to follow lower cholesterol, diabetic diet, wt down from 283 to 276 with better diet, but little exercise however.  Has ongoing right knee pain/DJD, mild, not worsening, asks for nsaid refills.   Pt denies fever, wt loss, night sweats, loss of appetite, or other constitutional symptoms  Denies worsening depressive symptoms, suicidal ideation, or panic, though has ongoing anxiety, not increased recently.  Past Medical History  Diagnosis Date  . History of Bell's palsy   . Esophageal reflux   . ED (erectile dysfunction)   . Chronic bronchitis   . Barrett's esophagus   . Ulcerative esophagitis   . IBS (irritable bowel syndrome)   . Personal history of colonic polyps 04/15/2008 & 04/22/2012    TUBULAR ADENOMA  . Anxiety   . Type II or unspecified type diabetes mellitus without mention of complication, not stated as uncontrolled   . Carpal tunnel syndrome, right 04/21/2011  . Hiatal hernia   . Esophageal stricture   . H/O adenomatous polyp of colon 06/05/2012   Past Surgical History  Procedure Date  . Inguinal hernia repair     bilateral  . Knee arthroscopy     right Dr Thomasena Edis  . Carpal tunnel release     left    reports that he has been smoking Cigarettes.  He has been smoking about .5 packs per day. He has never used smokeless tobacco. He reports that he drinks alcohol. He reports that he uses illicit drugs (Marijuana) about twice per week. family history includes Heart  disease in his father; Hypertension in his brother; and Lung cancer in his father.  There is no history of Colon cancer. Allergies  Allergen Reactions  . Bee Venom Anaphylaxis  . Aspirin Nausea Only  . Amoxicillin Rash   Current Outpatient Prescriptions on File Prior to Visit  Medication Sig Dispense Refill  . Ascorbic Acid (VITAMIN C PO) Take 1 tablet by mouth daily.        Marland Kitchen atorvastatin (LIPITOR) 20 MG tablet TAKE 1 TABLET BY MOUTH DAILY  30 tablet  11  . Cholecalciferol (VITAMIN D3) 1000 UNITS CAPS Take 1 tablet by mouth daily.        . Ferrous Sulfate (FE-CAPS) 250 MG CPCR Take 1 capsule by mouth daily.        . Glucosamine HCl 1500 MG TABS Take 2 tablets by mouth daily.        . Multiple Vitamin (MULTIVITAMINS PO) Take 1 tablet by mouth daily.        . nabumetone (RELAFEN) 500 MG tablet Take 1 tablet (500 mg total) by mouth 2 (two) times daily.  60 tablet  5  . Psyllium (METAMUCIL) 30.9 % POWD Take by mouth daily.        . RABEprazole (ACIPHEX) 20 MG tablet Take 1 tablet (20 mg total) by mouth 2 (two) times daily.  60 tablet  11  . vardenafil (LEVITRA) 20 MG tablet Take 1 tablet (20  mg total) by mouth daily as needed for erectile dysfunction.  5 tablet  11   Review of Systems  Constitutional: Negative for diaphoresis and unexpected weight change.  HENT: Negative for tinnitus.   Eyes: Negative for photophobia and visual disturbance.  Respiratory: Negative for choking and stridor.   Gastrointestinal: Negative for vomiting and blood in stool.  Genitourinary: Negative for hematuria and decreased urine volume.  Musculoskeletal: Negative for gait problem.  Skin: Negative for color change and wound.  Neurological: Negative for tremors and numbness.  Psychiatric/Behavioral: Negative for decreased concentration. The patient is not hyperactive.       Objective:   Physical Exam BP 122/70  Pulse 76  Temp 97.3 F (36.3 C) (Oral)  Ht 6\' 1"  (1.854 m)  Wt 276 lb 8 oz (125.42 kg)  BMI  36.48 kg/m2  SpO2 98% Physical Exam  VS noted Constitutional: Pt appears well-developed and well-nourished.  HENT: Head: Normocephalic.  Right Ear: External ear normal.  Left Ear: External ear normal.  Eyes: Conjunctivae and EOM are normal. Pupils are equal, round, and reactive to light.  Neck: Normal range of motion. Neck supple.  Cardiovascular: Normal rate and regular rhythm.   Pulmonary/Chest: Effort normal and breath sounds normal.  Neurological: Pt is alert. Not confused  Skin: Skin is warm. No erythema.  Psychiatric: Pt behavior is normal. Thought content normal. not depressed affect, mild nervous Right knee crepitus, trace effusion but FROM, NT    Assessment & Plan:

## 2012-09-28 NOTE — Assessment & Plan Note (Signed)
stable overall by hx and exam, most recent data reviewed with pt, and pt to continue medical treatment as before Lab Results  Component Value Date   HGBA1C 5.7 09/20/2012

## 2012-09-30 ENCOUNTER — Other Ambulatory Visit: Payer: Self-pay | Admitting: *Deleted

## 2012-09-30 DIAGNOSIS — R790 Abnormal level of blood mineral: Secondary | ICD-10-CM

## 2012-11-25 ENCOUNTER — Other Ambulatory Visit: Payer: Self-pay

## 2012-11-25 MED ORDER — NABUMETONE 500 MG PO TABS: 500.0000 mg | ORAL_TABLET | Freq: Two times a day (BID) | ORAL | Status: DC

## 2012-12-04 ENCOUNTER — Other Ambulatory Visit: Payer: Self-pay | Admitting: *Deleted

## 2012-12-04 DIAGNOSIS — Z86010 Personal history of colonic polyps: Secondary | ICD-10-CM

## 2012-12-04 DIAGNOSIS — K2270 Barrett's esophagus without dysplasia: Secondary | ICD-10-CM

## 2012-12-04 DIAGNOSIS — K219 Gastro-esophageal reflux disease without esophagitis: Secondary | ICD-10-CM

## 2012-12-04 MED ORDER — RABEPRAZOLE SODIUM 20 MG PO TBEC: 20.0000 mg | DELAYED_RELEASE_TABLET | Freq: Two times a day (BID) | ORAL | Status: DC

## 2013-03-16 ENCOUNTER — Other Ambulatory Visit: Payer: Self-pay | Admitting: Internal Medicine

## 2013-03-28 ENCOUNTER — Other Ambulatory Visit (INDEPENDENT_AMBULATORY_CARE_PROVIDER_SITE_OTHER): Payer: 59

## 2013-03-28 ENCOUNTER — Encounter: Payer: Self-pay | Admitting: Internal Medicine

## 2013-03-28 ENCOUNTER — Ambulatory Visit (INDEPENDENT_AMBULATORY_CARE_PROVIDER_SITE_OTHER): Payer: 59 | Admitting: Internal Medicine

## 2013-03-28 VITALS — BP 130/80 | HR 73 | Temp 98.4°F | Ht 73.0 in | Wt 277.2 lb

## 2013-03-28 DIAGNOSIS — E119 Type 2 diabetes mellitus without complications: Secondary | ICD-10-CM

## 2013-03-28 DIAGNOSIS — IMO0001 Reserved for inherently not codable concepts without codable children: Secondary | ICD-10-CM

## 2013-03-28 DIAGNOSIS — Z0000 Encounter for general adult medical examination without abnormal findings: Secondary | ICD-10-CM

## 2013-03-28 LAB — URINALYSIS, ROUTINE W REFLEX MICROSCOPIC
Hgb urine dipstick: NEGATIVE
Leukocytes, UA: NEGATIVE
Nitrite: NEGATIVE
Specific Gravity, Urine: 1.025 (ref 1.000–1.030)
Urobilinogen, UA: 0.2 (ref 0.0–1.0)

## 2013-03-28 LAB — CBC WITH DIFFERENTIAL/PLATELET
Basophils Absolute: 0.1 10*3/uL (ref 0.0–0.1)
Eosinophils Absolute: 0.2 10*3/uL (ref 0.0–0.7)
Hemoglobin: 13.9 g/dL (ref 13.0–17.0)
Lymphocytes Relative: 17.5 % (ref 12.0–46.0)
MCHC: 34.7 g/dL (ref 30.0–36.0)
Monocytes Relative: 9.4 % (ref 3.0–12.0)
Neutrophils Relative %: 70 % (ref 43.0–77.0)
RBC: 4.27 Mil/uL (ref 4.22–5.81)
RDW: 15 % — ABNORMAL HIGH (ref 11.5–14.6)

## 2013-03-28 LAB — HEPATIC FUNCTION PANEL
ALT: 21 U/L (ref 0–53)
Albumin: 4 g/dL (ref 3.5–5.2)
Alkaline Phosphatase: 96 U/L (ref 39–117)
Bilirubin, Direct: 0.1 mg/dL (ref 0.0–0.3)
Total Protein: 7.1 g/dL (ref 6.0–8.3)

## 2013-03-28 LAB — BASIC METABOLIC PANEL
BUN: 13 mg/dL (ref 6–23)
Chloride: 102 mEq/L (ref 96–112)
Potassium: 3.9 mEq/L (ref 3.5–5.1)

## 2013-03-28 LAB — LIPID PANEL
Cholesterol: 157 mg/dL (ref 0–200)
LDL Cholesterol: 82 mg/dL (ref 0–99)
Total CHOL/HDL Ratio: 3

## 2013-03-28 LAB — MICROALBUMIN / CREATININE URINE RATIO: Microalb, Ur: 2.6 mg/dL — ABNORMAL HIGH (ref 0.0–1.9)

## 2013-03-28 LAB — HEMOGLOBIN A1C: Hgb A1c MFr Bld: 5.8 % (ref 4.6–6.5)

## 2013-03-28 MED ORDER — ATORVASTATIN CALCIUM 20 MG PO TABS: 20.0000 mg | ORAL_TABLET | Freq: Every day | ORAL | Status: DC

## 2013-03-28 MED ORDER — NABUMETONE 500 MG PO TABS: 500.0000 mg | ORAL_TABLET | Freq: Two times a day (BID) | ORAL | Status: AC

## 2013-03-28 NOTE — Progress Notes (Signed)
Subjective:    Patient ID: Henry Ellis, male    DOB: 1955/12/28, 57 y.o.   MRN: [id number]  HPI  Here for wellness and f/u;  Overall doing ok;  Pt denies CP, worsening SOB, DOE, wheezing, orthopnea, PND, worsening LE edema, palpitations, dizziness or syncope.  Pt denies neurological change such as new headache, facial or extremity weakness.  Pt denies polydipsia, polyuria, or low sugar symptoms. Pt states overall good compliance with treatment and medications, good tolerability, and has been trying to follow lower cholesterol diet.  Pt denies worsening depressive symptoms, suicidal ideation or panic. No fever, night sweats, wt loss, loss of appetite, or other constitutional symptoms.  Pt states good ability with ADL's, has low fall risk, home safety reviewed and adequate, no other significant changes in hearing or vision, and only occasionally active with exercise.  Needs refill relafen. No acute complaints Past Medical History  Diagnosis Date  . History of Bell's palsy   . Esophageal reflux   . ED (erectile dysfunction)   . Chronic bronchitis   . Barrett's esophagus   . Ulcerative esophagitis   . IBS (irritable bowel syndrome)   . Personal history of colonic polyps 04/15/2008 & 04/22/2012    TUBULAR ADENOMA  . Anxiety   . Type II or unspecified type diabetes mellitus without mention of complication, not stated as uncontrolled   . Carpal tunnel syndrome, right 04/21/2011  . Hiatal hernia   . Esophageal stricture   . H/O adenomatous polyp of colon 06/05/2012   Past Surgical History  Procedure Laterality Date  . Inguinal hernia repair      bilateral  . Knee arthroscopy      right Dr Thomasena Edis  . Carpal tunnel release      left    reports that he has been smoking Cigarettes.  He has been smoking about 0.50 packs per day. He has never used smokeless tobacco. He reports that  drinks alcohol. He reports that he uses illicit drugs (Marijuana) about twice per week. family history includes Heart  disease in his father; Hypertension in his brother; and Lung cancer in his father.  There is no history of Colon cancer. Allergies  Allergen Reactions  . Bee Venom Anaphylaxis  . Aspirin Nausea Only  . Amoxicillin Rash   Current Outpatient Prescriptions on File Prior to Visit  Medication Sig Dispense Refill  . Ascorbic Acid (VITAMIN C PO) Take 1 tablet by mouth daily.        . Cholecalciferol (VITAMIN D3) 1000 UNITS CAPS Take 1 tablet by mouth daily.        . Ferrous Sulfate (FE-CAPS) 250 MG CPCR Take 1 capsule by mouth daily.        . Glucosamine HCl 1500 MG TABS Take 2 tablets by mouth daily.        . Multiple Vitamin (MULTIVITAMINS PO) Take 1 tablet by mouth daily.        . Psyllium (METAMUCIL) 30.9 % POWD Take by mouth daily.        . RABEprazole (ACIPHEX) 20 MG tablet Take 1 tablet (20 mg total) by mouth 2 (two) times daily.  60 tablet  11  . vardenafil (LEVITRA) 20 MG tablet Take 1 tablet (20 mg total) by mouth daily as needed for erectile dysfunction.  5 tablet  11   No current facility-administered medications on file prior to visit.   Review of Systems  Constitutional: Negative for diaphoresis, activity change, appetite change or unexpected weight change.  HENT: Negative for hearing loss, ear pain, facial swelling, mouth sores and neck stiffness.   Eyes: Negative for pain, redness and visual disturbance.  Respiratory: Negative for shortness of breath and wheezing.   Cardiovascular: Negative for chest pain and palpitations.  Gastrointestinal: Negative for diarrhea, blood in stool, abdominal distention or other pain Genitourinary: Negative for hematuria, flank pain or change in urine volume.  Musculoskeletal: Negative for myalgias and joint swelling.  Skin: Negative for color change and wound.  Neurological: Negative for syncope and numbness. other than noted Hematological: Negative for adenopathy.  Psychiatric/Behavioral: Negative for hallucinations, self-injury, decreased  concentration and agitation.       Objective:   Physical Exam BP 130/80  Pulse 73  Temp(Src) 98.4 F (36.9 C) (Oral)  Ht 6\' 1"  (1.854 m)  Wt 277 lb 4 oz (125.76 kg)  BMI 36.59 kg/m2  SpO2 98% VS noted,  Constitutional: Pt is oriented to person, place, and time. Appears well-developed and well-nourished.  Head: Normocephalic and atraumatic.  Right Ear: External ear normal.  Left Ear: External ear normal.  Nose: Nose normal.  Mouth/Throat: Oropharynx is clear and moist.  Eyes: Conjunctivae and EOM are normal. Pupils are equal, round, and reactive to light.  Neck: Normal range of motion. Neck supple. No JVD present. No tracheal deviation present.  Cardiovascular: Normal rate, regular rhythm, normal heart sounds and intact distal pulses.   Pulmonary/Chest: Effort normal and breath sounds normal.  Abdominal: Soft. Bowel sounds are normal. There is no tenderness. No HSM  Musculoskeletal: Normal range of motion. Exhibits no edema.  Lymphadenopathy:  Has no cervical adenopathy.  Neurological: Pt is alert and oriented to person, place, and time. Pt has normal reflexes. No cranial nerve deficit.  Skin: Skin is warm and dry. No rash noted.  Psychiatric:  Has ? Mild depressed mood and affect. Behavior is normal.     Assessment & Plan:

## 2013-03-28 NOTE — Assessment & Plan Note (Signed)
stable overall by history and exam, recent data reviewed with pt, and pt to continue medical treatment as before,  to f/u any worsening symptoms or concerns Lab Results  Component Value Date   HGBA1C 5.8 03/28/2013

## 2013-03-28 NOTE — Assessment & Plan Note (Signed)

## 2013-03-28 NOTE — Patient Instructions (Addendum)
Please continue all other medications as before, and refills have been done if requested - the relafen, and atorvastatin Please have the pharmacy call with any other refills you may need. Please continue your efforts at being more active, low cholesterol diet, and weight control. You are otherwise up to date with prevention measures today. Please go to the LAB in the Basement (turn left off the elevator) for the tests to be done today You will be contacted by phone if any changes need to be made immediately.  Otherwise, you will receive a letter about your results with an explanation Please remember to sign up for My Chart if you have not done so, as this will be important to you in the future with finding out test results, communicating by private email, and scheduling acute appointments online when needed. Please return in 6 months, or sooner if needed, with Lab testing done 3-5 days before

## 2013-04-20 ENCOUNTER — Other Ambulatory Visit: Payer: Self-pay | Admitting: Gastroenterology

## 2013-04-25 ENCOUNTER — Other Ambulatory Visit (INDEPENDENT_AMBULATORY_CARE_PROVIDER_SITE_OTHER): Payer: 59

## 2013-04-25 ENCOUNTER — Ambulatory Visit (INDEPENDENT_AMBULATORY_CARE_PROVIDER_SITE_OTHER): Payer: 59 | Admitting: Gastroenterology

## 2013-04-25 ENCOUNTER — Encounter: Payer: Self-pay | Admitting: Gastroenterology

## 2013-04-25 VITALS — BP 148/80 | HR 60 | Ht 73.0 in | Wt 278.4 lb

## 2013-04-25 DIAGNOSIS — Z86010 Personal history of colonic polyps: Secondary | ICD-10-CM

## 2013-04-25 DIAGNOSIS — R899 Unspecified abnormal finding in specimens from other organs, systems and tissues: Secondary | ICD-10-CM

## 2013-04-25 DIAGNOSIS — R6889 Other general symptoms and signs: Secondary | ICD-10-CM

## 2013-04-25 DIAGNOSIS — K219 Gastro-esophageal reflux disease without esophagitis: Secondary | ICD-10-CM

## 2013-04-25 DIAGNOSIS — K5730 Diverticulosis of large intestine without perforation or abscess without bleeding: Secondary | ICD-10-CM

## 2013-04-25 LAB — CBC WITH DIFFERENTIAL/PLATELET
Eosinophils Absolute: 0.2 10*3/uL (ref 0.0–0.7)
MCHC: 34.2 g/dL (ref 30.0–36.0)
MCV: 96.5 fl (ref 78.0–100.0)
Monocytes Absolute: 0.8 10*3/uL (ref 0.1–1.0)
Neutrophils Relative %: 66.7 % (ref 43.0–77.0)
Platelets: 211 10*3/uL (ref 150.0–400.0)

## 2013-04-25 LAB — IBC PANEL
Iron: 95 ug/dL (ref 42–165)
Transferrin: 207.5 mg/dL — ABNORMAL LOW (ref 212.0–360.0)

## 2013-04-25 LAB — VITAMIN B12: Vitamin B-12: 763 pg/mL (ref 211–911)

## 2013-04-25 MED ORDER — RABEPRAZOLE SODIUM 20 MG PO TBEC: 20.0000 mg | DELAYED_RELEASE_TABLET | Freq: Two times a day (BID) | ORAL | Status: DC

## 2013-04-25 NOTE — Progress Notes (Signed)
This is a 57 year old Caucasian male with chronic obesity and GERD.  We evaluated him because of iron deficiency anemia and he had negative colonoscopy, endoscopy consistent with GERD, and is been on AcipHex 20 mg a day with daily Metamucil and denies any current GI complaints.  He occasionally has some liquid dysphagia, but denies solid food dysphagia or persistent reflux symptoms.  He also has regular bowel movements without melena or hematochezia.  He is on daily iron capsules.  Current Medications, Allergies, Past Medical History, Past Surgical History, Family History and Social History were reviewed in [employer] record.  ROS: All systems were reviewed and are negative unless otherwise stated in the HPI.          Physical Exam: Blood pressure 148/80, pulse 60 and regular weight 278 the BMI of 36.74.  98% oxygen saturation on room air.  I cannot appreciate stigmata of chronic liver disease.  Examination of his neck is unremarkable, examination oropharynx area shows no masses or lesions.  Mental status is normal.    Assessment and Plan: I renewed his AcipHex for his chronic GERD.  I think if he continues to have progressive dysphagia for liquids, we should do high-resolution esophageal manometry.  I will repeat iron studies, also IFOB cards for human hemoglobin.  Otherwise I will see him on a when necessary basis as needed.  Colonoscopy did reveal diverticulosis and external hemorrhoids, so 2 tubular adenomas which were removed.  He is due for followup colonoscopy in 5 years.  Endoscopy showed an esophageal stricture but no definite Barrett's mucosa.  He was dilated at the time of endoscopy, and denies current solid food dysphagia. Encounter Diagnoses  Name Primary?  . Abnormal laboratory test Yes  . Esophageal reflux   . Personal history of colonic polyps   . Barrett's esophagus

## 2013-04-25 NOTE — Patient Instructions (Addendum)
Your physician has requested that you go to the basement for the following lab work before leaving today: CBC, Iron Studies and IFOB's  Aciphex prescription was sent to your pharmacy.  Please follow up in one year with Dr. Jarold Motto.

## 2013-05-05 ENCOUNTER — Telehealth: Payer: Self-pay | Admitting: *Deleted

## 2013-05-05 NOTE — Telephone Encounter (Signed)
Via fax from [place] prior auth needed for Aciphex I called [phone]--[facility] Per Leavy Cella at [facility] Aciphex was approved Approval code is [id number]

## 2013-05-13 ENCOUNTER — Other Ambulatory Visit: Payer: Self-pay | Admitting: Internal Medicine

## 2013-05-13 NOTE — Telephone Encounter (Signed)
Done erx 

## 2013-07-08 ENCOUNTER — Other Ambulatory Visit: Payer: Self-pay | Admitting: Gastroenterology

## 2013-09-25 ENCOUNTER — Other Ambulatory Visit (INDEPENDENT_AMBULATORY_CARE_PROVIDER_SITE_OTHER): Payer: 59

## 2013-09-25 DIAGNOSIS — IMO0001 Reserved for inherently not codable concepts without codable children: Secondary | ICD-10-CM

## 2013-09-25 LAB — BASIC METABOLIC PANEL
BUN: 15 mg/dL (ref 6–23)
CO2: 28 mEq/L (ref 19–32)
Calcium: 9.2 mg/dL (ref 8.4–10.5)
Chloride: 105 mEq/L (ref 96–112)
GFR: 114.94 mL/min (ref >60.00)
Glucose, Bld: 100 mg/dL — ABNORMAL HIGH (ref 70–99)
Potassium: 4.4 mEq/L (ref 3.5–5.1)
Sodium: 142 mEq/L (ref 135–145)

## 2013-09-25 LAB — HEPATIC FUNCTION PANEL
ALT: 19 U/L (ref 0–53)
AST: 18 U/L (ref 0–37)
Albumin: 4.1 g/dL (ref 3.5–5.2)
Alkaline Phosphatase: 92 U/L (ref 39–117)
Total Protein: 7.5 g/dL (ref 6.0–8.3)

## 2013-09-25 LAB — LIPID PANEL: Cholesterol: 160 mg/dL (ref 0–200)

## 2013-09-25 LAB — HEMOGLOBIN A1C: Hgb A1c MFr Bld: 6.1 % (ref 4.6–6.5)

## 2013-10-03 ENCOUNTER — Encounter: Payer: Self-pay | Admitting: Internal Medicine

## 2013-10-03 ENCOUNTER — Ambulatory Visit (INDEPENDENT_AMBULATORY_CARE_PROVIDER_SITE_OTHER): Payer: 59 | Admitting: Internal Medicine

## 2013-10-03 VITALS — BP 120/80 | HR 68 | Temp 98.2°F | Ht 73.0 in | Wt 276.2 lb

## 2013-10-03 DIAGNOSIS — Z23 Encounter for immunization: Secondary | ICD-10-CM

## 2013-10-03 DIAGNOSIS — J309 Allergic rhinitis, unspecified: Secondary | ICD-10-CM

## 2013-10-03 DIAGNOSIS — E119 Type 2 diabetes mellitus without complications: Secondary | ICD-10-CM

## 2013-10-03 DIAGNOSIS — E7800 Pure hypercholesterolemia, unspecified: Secondary | ICD-10-CM

## 2013-10-03 DIAGNOSIS — Z0000 Encounter for general adult medical examination without abnormal findings: Secondary | ICD-10-CM

## 2013-10-03 NOTE — Assessment & Plan Note (Signed)
For otc allegra prn,  to f/u any worsening symptoms or concerns  

## 2013-10-03 NOTE — Progress Notes (Signed)
Subjective:    Patient ID: Henry Ellis, male    DOB: Jan 26, 1956, 57 y.o.   MRN: [id number]  HPI  Here to f/u; overall doing ok,  Pt denies chest pain, increased sob or doe, wheezing, orthopnea, PND, increased LE swelling, palpitations, dizziness or syncope.  Pt denies polydipsia, polyuria, or low sugar symptoms such as weakness or confusion improved with po intake.  Pt denies new neurological symptoms such as new headache, or facial or extremity weakness or numbness.   Pt states overall good compliance with meds, has been trying to follow lower cholesterol, diabetic diet, with wt overall stable,  but little exercise however.  No acute complaints except Does have several wks ongoing nasal allergy symptoms with clearish congestion, itch and sneezing, without fever, pain, ST, cough, swelling or wheezing. Past Medical History  Diagnosis Date  . History of Bell's palsy   . Esophageal reflux   . ED (erectile dysfunction)   . Chronic bronchitis   . Barrett's esophagus   . Ulcerative esophagitis   . IBS (irritable bowel syndrome)   . Personal history of colonic polyps 04/15/2008 & 04/22/2012    TUBULAR ADENOMA  . Anxiety   . Type II or unspecified type diabetes mellitus without mention of complication, not stated as uncontrolled   . Carpal tunnel syndrome, right 04/21/2011  . Hiatal hernia   . Esophageal stricture   . H/O adenomatous polyp of colon 06/05/2012   Past Surgical History  Procedure Laterality Date  . Inguinal hernia repair      bilateral  . Knee arthroscopy      right Dr Thomasena Edis  . Carpal tunnel release      left    reports that he has been smoking Cigarettes.  He has been smoking about 0.50 packs per day. He has never used smokeless tobacco. He reports that he drinks alcohol. He reports that he uses illicit drugs (Marijuana) about twice per week. family history includes Heart disease in his father; Hypertension in his brother; Lung cancer in his father. There is no history of Colon  cancer. Allergies  Allergen Reactions  . Bee Venom Anaphylaxis  . Aspirin Nausea Only  . Amoxicillin Rash   Current Outpatient Prescriptions on File Prior to Visit  Medication Sig Dispense Refill  . Ascorbic Acid (VITAMIN C PO) Take 1 tablet by mouth daily.        Marland Kitchen atorvastatin (LIPITOR) 20 MG tablet Take 1 tablet (20 mg total) by mouth daily.  90 tablet  3  . Cholecalciferol (VITAMIN D3) 1000 UNITS CAPS Take 1 tablet by mouth daily.        . Ferrous Sulfate (FE-CAPS) 250 MG CPCR Take 1 capsule by mouth daily.        . Glucosamine HCl 1500 MG TABS Take 2 tablets by mouth daily.        . Multiple Vitamin (MULTIVITAMINS PO) Take 1 tablet by mouth daily.        . nabumetone (RELAFEN) 500 MG tablet Take 1 tablet (500 mg total) by mouth 2 (two) times daily.  60 tablet  5  . nabumetone (RELAFEN) 500 MG tablet TAKE 1 TABLET BY MOUTH TWICE DAILY  60 tablet  3  . Psyllium (METAMUCIL) 30.9 % POWD Take by mouth daily.        . RABEprazole (ACIPHEX) 20 MG tablet Take 1 tablet (20 mg total) by mouth 2 (two) times daily.  60 tablet  11   No current facility-administered medications on file  prior to visit.     Review of Systems  Constitutional: Negative for unexpected weight change, or unusual diaphoresis  HENT: Negative for tinnitus.   Eyes: Negative for photophobia and visual disturbance.  Respiratory: Negative for choking and stridor.   Gastrointestinal: Negative for vomiting and blood in stool.  Genitourinary: Negative for hematuria and decreased urine volume.  Musculoskeletal: Negative for acute joint swelling Skin: Negative for color change and wound.  Neurological: Negative for tremors and numbness other than noted  Psychiatric/Behavioral: Negative for decreased concentration or  hyperactivity.       Objective:   Physical Exam BP 120/80  Pulse 68  Temp(Src) 98.2 F (36.8 C) (Oral)  Ht 6\' 1"  (1.854 m)  Wt 276 lb 4 oz (125.306 kg)  BMI 36.45 kg/m2  SpO2 97% VS noted,   Constitutional: Pt appears well-developed and well-nourished.  HENT: Head: NCAT.  Right Ear: External ear normal.  Left Ear: External ear normal.  Eyes: Conjunctivae and EOM are normal. Pupils are equal, round, and reactive to light.  Bilat tm's with mild erythema.  Max sinus areas non tender.  Pharynx with mild erythema, no exudate Neck: Normal range of motion. Neck supple.  Cardiovascular: Normal rate and regular rhythm.   Pulmonary/Chest: Effort normal and breath sounds normal.  Neurological: Pt is alert. Not confused  Skin: Skin is warm. No erythema.  Psychiatric: Pt behavior is normal. Thought content normal. not depressed affect     Assessment & Plan:

## 2013-10-03 NOTE — Assessment & Plan Note (Signed)
stable overall by history and exam, recent data reviewed with pt, and pt to continue medical treatment as before,  to f/u any worsening symptoms or concerns Lab Results  Component Value Date   HGBA1C 6.1 09/25/2013    

## 2013-10-03 NOTE — Patient Instructions (Addendum)
You had the flu shot today Please continue all other medications as before, and refills have been done if requested. Please have the pharmacy call with any other refills you may need.  Please keep your appointments with your specialists as you may have planned  Please return in 6 months, or sooner if needed, with Lab testing done 3-5 days before

## 2013-10-03 NOTE — Assessment & Plan Note (Signed)
stable overall by history and exam, recent data reviewed with pt, and pt to continue medical treatment as before,  to f/u any worsening symptoms or concerns Lab Results  Component Value Date   LDLCALC 75 09/25/2013

## 2014-03-26 ENCOUNTER — Other Ambulatory Visit (INDEPENDENT_AMBULATORY_CARE_PROVIDER_SITE_OTHER): Payer: 59

## 2014-03-26 DIAGNOSIS — Z0000 Encounter for general adult medical examination without abnormal findings: Secondary | ICD-10-CM

## 2014-03-26 DIAGNOSIS — E119 Type 2 diabetes mellitus without complications: Secondary | ICD-10-CM

## 2014-03-26 LAB — CBC WITH DIFFERENTIAL/PLATELET
BASOS PCT: 0.4 % (ref 0.0–3.0)
Basophils Absolute: 0 10*3/uL (ref 0.0–0.1)
EOS PCT: 1.9 % (ref 0.0–5.0)
Eosinophils Absolute: 0.2 10*3/uL (ref 0.0–0.7)
HEMATOCRIT: 41 % (ref 39.0–52.0)
HEMOGLOBIN: 14 g/dL (ref 13.0–17.0)
Lymphocytes Relative: 19.3 % (ref 12.0–46.0)
Lymphs Abs: 1.6 10*3/uL (ref 0.7–4.0)
MCHC: 34.1 g/dL (ref 30.0–36.0)
MCV: 97 fl (ref 78.0–100.0)
MONO ABS: 0.8 10*3/uL (ref 0.1–1.0)
MONOS PCT: 10.3 % (ref 3.0–12.0)
NEUTROS ABS: 5.5 10*3/uL (ref 1.4–7.7)
Neutrophils Relative %: 68.1 % (ref 43.0–77.0)
Platelets: 218 10*3/uL (ref 150.0–400.0)
RBC: 4.23 Mil/uL (ref 4.22–5.81)
RDW: 14 % (ref 11.5–14.6)
WBC: 8.1 10*3/uL (ref 4.5–10.5)

## 2014-03-26 LAB — URINALYSIS, ROUTINE W REFLEX MICROSCOPIC
BILIRUBIN URINE: NEGATIVE
Hgb urine dipstick: NEGATIVE
KETONES UR: NEGATIVE
Leukocytes, UA: NEGATIVE
Nitrite: NEGATIVE
RBC / HPF: NONE SEEN (ref 0–?)
SPECIFIC GRAVITY, URINE: 1.015 (ref 1.000–1.030)
TOTAL PROTEIN, URINE-UPE24: NEGATIVE
Urine Glucose: NEGATIVE
Urobilinogen, UA: 0.2 (ref 0.0–1.0)
WBC, UA: NONE SEEN (ref 0–?)
pH: 5.5 (ref 5.0–8.0)

## 2014-03-26 LAB — LIPID PANEL
CHOLESTEROL: 161 mg/dL (ref 0–200)
HDL: 59.3 mg/dL (ref >39.00)
LDL Cholesterol: 77 mg/dL (ref 0–99)
Total CHOL/HDL Ratio: 3
Triglycerides: 125 mg/dL (ref 0.0–149.0)
VLDL: 25 mg/dL (ref 0.0–40.0)

## 2014-03-26 LAB — BASIC METABOLIC PANEL
BUN: 16 mg/dL (ref 6–23)
CALCIUM: 9.2 mg/dL (ref 8.4–10.5)
CO2: 29 mEq/L (ref 19–32)
Chloride: 105 mEq/L (ref 96–112)
Creatinine, Ser: 0.8 mg/dL (ref 0.4–1.5)
GFR: 112.98 mL/min (ref >60.00)
Glucose, Bld: 107 mg/dL — ABNORMAL HIGH (ref 70–99)
POTASSIUM: 4.9 meq/L (ref 3.5–5.1)
Sodium: 142 mEq/L (ref 135–145)

## 2014-03-26 LAB — HEPATIC FUNCTION PANEL
ALT: 25 U/L (ref 0–53)
AST: 21 U/L (ref 0–37)
Albumin: 4 g/dL (ref 3.5–5.2)
Alkaline Phosphatase: 88 U/L (ref 39–117)
Bilirubin, Direct: 0.1 mg/dL (ref 0.0–0.3)
Total Bilirubin: 0.7 mg/dL (ref 0.3–1.2)
Total Protein: 7 g/dL (ref 6.0–8.3)

## 2014-03-26 LAB — HEMOGLOBIN A1C: Hgb A1c MFr Bld: 6 % (ref 4.6–6.5)

## 2014-03-26 LAB — PSA: PSA: 2.6 ng/mL (ref 0.10–4.00)

## 2014-03-26 LAB — MICROALBUMIN / CREATININE URINE RATIO
Creatinine,U: 56.2 mg/dL
MICROALB UR: 0.6 mg/dL (ref 0.0–1.9)
Microalb Creat Ratio: 1.1 mg/g (ref 0.0–30.0)

## 2014-03-26 LAB — TSH: TSH: 0.52 u[IU]/mL (ref 0.35–5.50)

## 2014-04-03 ENCOUNTER — Ambulatory Visit (INDEPENDENT_AMBULATORY_CARE_PROVIDER_SITE_OTHER): Payer: 59 | Admitting: Internal Medicine

## 2014-04-03 ENCOUNTER — Encounter: Payer: Self-pay | Admitting: Internal Medicine

## 2014-04-03 VITALS — BP 136/82 | HR 68 | Wt 276.0 lb

## 2014-04-03 DIAGNOSIS — Z0000 Encounter for general adult medical examination without abnormal findings: Secondary | ICD-10-CM

## 2014-04-03 DIAGNOSIS — J309 Allergic rhinitis, unspecified: Secondary | ICD-10-CM

## 2014-04-03 DIAGNOSIS — F17200 Nicotine dependence, unspecified, uncomplicated: Secondary | ICD-10-CM

## 2014-04-03 MED ORDER — FLUTICASONE PROPIONATE 50 MCG/ACT NA SUSP: 2.0000 | Freq: Every day | NASAL | Status: DC

## 2014-04-03 MED ORDER — NABUMETONE 500 MG PO TABS: ORAL_TABLET | ORAL | Status: DC

## 2014-04-03 MED ORDER — ATORVASTATIN CALCIUM 20 MG PO TABS: 20.0000 mg | ORAL_TABLET | Freq: Every day | ORAL | Status: DC

## 2014-04-03 NOTE — Patient Instructions (Signed)
Please call your insurance to check if they will pay for the shingles shot after 60yo, as well as a "screening CT scan of chest" to screen for lung cancer  Please take all new medication as prescribed - the flonase for allergies  Please continue all other medications as before, and refills have been done if requested. Please have the pharmacy call with any other refills you may need.  Your Lab work was good today  Please keep your appointments with your specialists as you have planned  Please remember to sign up for MyChart if you have not done so, as this will be important to you in the future with finding out test results, communicating by private email, and scheduling acute appointments online when needed.  Please return in 6 months, or sooner if needed

## 2014-04-03 NOTE — Assessment & Plan Note (Signed)
For flonase asd,  to f/u any worsening symptoms or concerns  

## 2014-04-03 NOTE — Progress Notes (Signed)
Pre visit review using our clinic review tool, if applicable. No additional management support is needed unless otherwise documented below in the visit note. 

## 2014-04-03 NOTE — Assessment & Plan Note (Signed)

## 2014-04-03 NOTE — Assessment & Plan Note (Signed)
Urged to quit, pt to contact insurance regarding coverage for screening CT chest

## 2014-04-03 NOTE — Progress Notes (Signed)
Subjective:    Patient ID: Henry Ellis, male    DOB: 1956-08-02, 58 y.o.   MRN: [id number]  HPI  Here for wellness and f/u;  Overall doing ok;  Pt denies CP, worsening SOB, DOE, wheezing, orthopnea, PND, worsening LE edema, palpitations, dizziness or syncope.  Pt denies neurological change such as new headache, facial or extremity weakness.  Pt denies polydipsia, polyuria, or low sugar symptoms. Pt states overall good compliance with treatment and medications, good tolerability, and has been trying to follow lower cholesterol diet.  Pt denies worsening depressive symptoms, suicidal ideation or panic. No fever, night sweats, wt loss, loss of appetite, or other constitutional symptoms.  Pt states good ability with ADL's, has low fall risk, home safety reviewed and adequate, no other significant changes in hearing or vision, and only occasionally active with exercise.  Does have several wks ongoing nasal allergy symptoms with clearish congestion, itch and sneezing, without fever, pain, ST, cough, swelling or wheezing.  Still smoking, unable to quit.   Past Medical History  Diagnosis Date  . History of Bell's palsy   . Esophageal reflux   . ED (erectile dysfunction)   . Chronic bronchitis   . Barrett's esophagus   . Ulcerative esophagitis   . IBS (irritable bowel syndrome)   . Personal history of colonic polyps 04/15/2008 & 04/22/2012    TUBULAR ADENOMA  . Anxiety   . Type II or unspecified type diabetes mellitus without mention of complication, not stated as uncontrolled   . Carpal tunnel syndrome, right 04/21/2011  . Hiatal hernia   . Esophageal stricture   . H/O adenomatous polyp of colon 06/05/2012   Past Surgical History  Procedure Laterality Date  . Inguinal hernia repair      bilateral  . Knee arthroscopy      right [name]  . Carpal tunnel release      left    reports that he has been smoking Cigarettes.  He has been smoking about 1.00 pack per day. He has never used smokeless  tobacco. He reports that he drinks alcohol. He reports that he uses illicit drugs (Marijuana) about twice per week. family history includes Heart disease in his father; Hypertension in his brother; Lung cancer in his father. There is no history of Colon cancer. Allergies  Allergen Reactions  . Bee Venom Anaphylaxis  . Aspirin Nausea Only  . Amoxicillin Rash   Current Outpatient Prescriptions on File Prior to Visit  Medication Sig Dispense Refill  . Ascorbic Acid (VITAMIN C PO) Take 1 tablet by mouth daily.        . Cholecalciferol (VITAMIN D3) 1000 UNITS CAPS Take 1 tablet by mouth daily.        . Ferrous Sulfate (FE-CAPS) 250 MG CPCR Take 1 capsule by mouth daily.        . Glucosamine HCl 1500 MG TABS Take 2 tablets by mouth daily.        . Multiple Vitamin (MULTIVITAMINS PO) Take 1 tablet by mouth daily.        . Psyllium (METAMUCIL) 30.9 % POWD Take by mouth daily.        . RABEprazole (ACIPHEX) 20 MG tablet Take 1 tablet (20 mg total) by mouth 2 (two) times daily.  60 tablet  11   No current facility-administered medications on file prior to visit.   Review of Systems Constitutional: Negative for diaphoresis, activity change, appetite change or unexpected weight change.  HENT: Negative for hearing  loss, ear pain, facial swelling, mouth sores and neck stiffness.   Eyes: Negative for pain, redness and visual disturbance.  Respiratory: Negative for shortness of breath and wheezing.   Cardiovascular: Negative for chest pain and palpitations.  Gastrointestinal: Negative for diarrhea, blood in stool, abdominal distention or other pain Genitourinary: Negative for hematuria, flank pain or change in urine volume.  Musculoskeletal: Negative for myalgias and joint swelling.  Skin: Negative for color change and wound.  Neurological: Negative for syncope and numbness. other than noted Hematological: Negative for adenopathy.  Psychiatric/Behavioral: Negative for hallucinations, self-injury,  decreased concentration and agitation.      Objective:   Physical Exam BP 136/82  Pulse 68  Wt 276 lb (125.193 kg) VS noted,  Constitutional: Pt is oriented to person, place, and time. Appears well-developed and well-nourished.  Head: Normocephalic and atraumatic.  Right Ear: External ear normal.  Left Ear: External ear normal.  Nose: Nose normal.  Mouth/Throat: Oropharynx is clear and moist.  Eyes: Conjunctivae and EOM are normal. Pupils are equal, round, and reactive to light.  Neck: Normal range of motion. Neck supple. No JVD present. No tracheal deviation present.  Cardiovascular: Normal rate, regular rhythm, normal heart sounds and intact distal pulses.   Pulmonary/Chest: Effort normal and breath sounds normal.  Abdominal: Soft. Bowel sounds are normal. There is no tenderness. No HSM  Musculoskeletal: Normal range of motion. Exhibits no edema.  Lymphadenopathy:  Has no cervical adenopathy.  Neurological: Pt is alert and oriented to person, place, and time. Pt has normal reflexes. No cranial nerve deficit.  Skin: Skin is warm and dry. No rash noted.  Psychiatric:  Has  normal mood and affect. Behavior is normal.     Assessment & Plan:

## 2014-04-04 ENCOUNTER — Telehealth: Payer: Self-pay | Admitting: Internal Medicine

## 2014-04-04 NOTE — Telephone Encounter (Signed)
Relevant patient education assigned to patient using Emmi. ° °

## 2014-04-19 ENCOUNTER — Other Ambulatory Visit: Payer: Self-pay | Admitting: Internal Medicine

## 2014-08-18 ENCOUNTER — Encounter: Payer: Self-pay | Admitting: Gastroenterology

## 2014-09-20 ENCOUNTER — Other Ambulatory Visit: Payer: Self-pay | Admitting: Internal Medicine

## 2014-10-02 ENCOUNTER — Other Ambulatory Visit: Payer: Self-pay

## 2014-10-09 ENCOUNTER — Ambulatory Visit (INDEPENDENT_AMBULATORY_CARE_PROVIDER_SITE_OTHER): Payer: 59 | Admitting: Internal Medicine

## 2014-10-09 ENCOUNTER — Encounter: Payer: Self-pay | Admitting: Internal Medicine

## 2014-10-09 VITALS — BP 132/84 | HR 70 | Temp 98.5°F | Ht 73.0 in | Wt 274.0 lb

## 2014-10-09 DIAGNOSIS — Z23 Encounter for immunization: Secondary | ICD-10-CM

## 2014-10-09 DIAGNOSIS — E119 Type 2 diabetes mellitus without complications: Secondary | ICD-10-CM

## 2014-10-09 DIAGNOSIS — F329 Major depressive disorder, single episode, unspecified: Secondary | ICD-10-CM

## 2014-10-09 DIAGNOSIS — E7800 Pure hypercholesterolemia, unspecified: Secondary | ICD-10-CM

## 2014-10-09 DIAGNOSIS — Z0189 Encounter for other specified special examinations: Secondary | ICD-10-CM

## 2014-10-09 DIAGNOSIS — Z0000 Encounter for general adult medical examination without abnormal findings: Secondary | ICD-10-CM

## 2014-10-09 DIAGNOSIS — F32A Depression, unspecified: Secondary | ICD-10-CM

## 2014-10-09 MED ORDER — NABUMETONE 500 MG PO TABS: ORAL_TABLET | ORAL | Status: DC

## 2014-10-09 NOTE — Assessment & Plan Note (Signed)
stable overall by history and exam, recent data reviewed with pt, and pt to continue medical treatment as before,  to f/u any worsening symptoms or concerns Lab Results  Component Value Date   WBC 8.1 03/26/2014   HGB 14.0 03/26/2014   HCT 41.0 03/26/2014   PLT 218.0 03/26/2014   GLUCOSE 107* 03/26/2014   CHOL 161 03/26/2014   TRIG 125.0 03/26/2014   HDL 59.30 03/26/2014   LDLDIRECT 151.7 03/25/2010   LDLCALC 77 03/26/2014   ALT 25 03/26/2014   AST 21 03/26/2014   NA 142 03/26/2014   K 4.9 03/26/2014   CL 105 03/26/2014   CREATININE 0.8 03/26/2014   BUN 16 03/26/2014   CO2 29 03/26/2014   TSH 0.52 03/26/2014   PSA 2.60 03/26/2014   HGBA1C 6.0 03/26/2014   MICROALBUR 0.6 03/26/2014

## 2014-10-09 NOTE — Progress Notes (Signed)
Subjective:    Patient ID: Henry Ellis, male    DOB: 1956-04-05, 58 y.o.   MRN: [id number]  HPI  Here to f/u; overall doing ok,  Pt denies chest pain, increased sob or doe, wheezing, orthopnea, PND, increased LE swelling, palpitations, dizziness or syncope.  Pt denies polydipsia, polyuria, or low sugar symptoms such as weakness or confusion improved with po intake.  Pt denies new neurological symptoms such as new headache, or facial or extremity weakness or numbness.   Pt states overall good compliance with meds, has been trying to follow lower cholesterol, diabetic diet, with wt overall stable,  but little exercise however. No new complaints. Due for flu shot. Denies worsening depressive symptoms, suicidal ideation, or panic Past Medical History  Diagnosis Date  . History of Bell's palsy   . Esophageal reflux   . ED (erectile dysfunction)   . Chronic bronchitis   . Barrett's esophagus   . Ulcerative esophagitis   . IBS (irritable bowel syndrome)   . Personal history of colonic polyps 04/15/2008 & 04/22/2012    TUBULAR ADENOMA  . Anxiety   . Type II or unspecified type diabetes mellitus without mention of complication, not stated as uncontrolled   . Carpal tunnel syndrome, right 04/21/2011  . Hiatal hernia   . Esophageal stricture   . H/O adenomatous polyp of colon 06/05/2012   Past Surgical History  Procedure Laterality Date  . Inguinal hernia repair      bilateral  . Knee arthroscopy      right [name]  . Carpal tunnel release      left    reports that he has been smoking Cigarettes.  He has been smoking about 1.00 pack per day. He has never used smokeless tobacco. He reports that he drinks alcohol. He reports that he uses illicit drugs (Marijuana) about twice per week. family history includes Heart disease in his father; Hypertension in his brother; Lung cancer in his father. There is no history of Colon cancer. Allergies  Allergen Reactions  . Bee Venom Anaphylaxis  .  Aspirin Nausea Only  . Amoxicillin Rash   Current Outpatient Prescriptions on File Prior to Visit  Medication Sig Dispense Refill  . Ascorbic Acid (VITAMIN C PO) Take 1 tablet by mouth daily.        Marland Kitchen atorvastatin (LIPITOR) 20 MG tablet Take 1 tablet (20 mg total) by mouth daily.  90 tablet  3  . atorvastatin (LIPITOR) 20 MG tablet TAKE 1 TABLET BY MOUTH DAILY  90 tablet  3  . Cholecalciferol (VITAMIN D3) 1000 UNITS CAPS Take 1 tablet by mouth daily.        . Ferrous Sulfate (FE-CAPS) 250 MG CPCR Take 1 capsule by mouth daily.        . fluticasone (FLONASE) 50 MCG/ACT nasal spray PLACE 2 SPRAYS INTO BOTH NOSTRILS DAILY  16 g  5  . Glucosamine HCl 1500 MG TABS Take 2 tablets by mouth daily.        . Multiple Vitamin (MULTIVITAMINS PO) Take 1 tablet by mouth daily.        Marland Kitchen omeprazole (PRILOSEC OTC) 20 MG tablet Take 20 mg by mouth daily.      . Psyllium (METAMUCIL) 30.9 % POWD Take by mouth daily.         No current facility-administered medications on file prior to visit.   Review of Systems  Constitutional: Negative for unusual diaphoresis or other sweats  HENT: Negative for ringing  in ear Eyes: Negative for double vision or worsening visual disturbance.  Respiratory: Negative for choking and stridor.   Gastrointestinal: Negative for vomiting or other signifcant bowel change Genitourinary: Negative for hematuria or decreased urine volume.  Musculoskeletal: Negative for other MSK pain or swelling Skin: Negative for color change and worsening wound.  Neurological: Negative for tremors and numbness other than noted  Psychiatric/Behavioral: Negative for decreased concentration or agitation other than above  '    Objective:   Physical Exam BP 132/84  Pulse 70  Temp(Src) 98.5 F (36.9 C) (Oral)  Ht 6\' 1"  (1.854 m)  Wt 274 lb (124.286 kg)  BMI 36.16 kg/m2  SpO2 98% VS noted, not ill appearing Constitutional: Pt appears well-developed, well-nourished. /morbid obese HENT: Head:  NCAT.  Right Ear: External ear normal.  Left Ear: External ear normal.  Eyes: . Pupils are equal, round, and reactive to light. Conjunctivae and EOM are normal Neck: Normal range of motion. Neck supple.  Cardiovascular: Normal rate and regular rhythm.   Pulmonary/Chest: Effort normal and breath sounds normal.  Neurological: Pt is alert. Not confused , motor grossly intact Skin: Skin is warm. No rash Psychiatric: Pt behavior is normal. No agitation. not depressed affect    Assessment & Plan:

## 2014-10-09 NOTE — Assessment & Plan Note (Signed)
stable overall by history and exam, recent data reviewed with pt, and pt to continue medical treatment as before,  to f/u any worsening symptoms or concerns Lab Results  Component Value Date   HGBA1C 6.0 03/26/2014   Declines lab f/u today, has had excellent control to date

## 2014-10-09 NOTE — Assessment & Plan Note (Signed)
stable overall by history and exam, recent data reviewed with pt, and pt to continue medical treatment as before,  to f/u any worsening symptoms or concerns   Lab Results  Component Value Date   LDLCALC 77 03/26/2014

## 2014-10-09 NOTE — Progress Notes (Signed)
Pre visit review using our clinic review tool, if applicable. No additional management support is needed unless otherwise documented below in the visit note. 

## 2014-10-09 NOTE — Patient Instructions (Addendum)
You had the flu shot today  Please continue all other medications as before, and refills have been done if requested - relafen  Please have the pharmacy call with any other refills you may need.  Please continue your efforts at being more active, low cholesterol diet, and weight control.  You are otherwise up to date with prevention measures today.  Please keep your appointments with your specialists as you may have planned  Please return in 6 months, or sooner if needed, with Lab testing done 3-5 days before

## 2015-03-01 ENCOUNTER — Other Ambulatory Visit (INDEPENDENT_AMBULATORY_CARE_PROVIDER_SITE_OTHER): Payer: Self-pay

## 2015-03-01 DIAGNOSIS — Z0189 Encounter for other specified special examinations: Secondary | ICD-10-CM

## 2015-03-01 DIAGNOSIS — E119 Type 2 diabetes mellitus without complications: Secondary | ICD-10-CM

## 2015-03-01 DIAGNOSIS — Z0000 Encounter for general adult medical examination without abnormal findings: Secondary | ICD-10-CM

## 2015-03-01 LAB — LIPID PANEL
Cholesterol: 154 mg/dL (ref 0–200)
HDL: 63 mg/dL (ref >39.00)
LDL CALC: 64 mg/dL (ref 0–99)
NonHDL: 91
Total CHOL/HDL Ratio: 2
Triglycerides: 133 mg/dL (ref 0.0–149.0)
VLDL: 26.6 mg/dL (ref 0.0–40.0)

## 2015-03-01 LAB — CBC WITH DIFFERENTIAL/PLATELET
BASOS PCT: 0.3 % (ref 0.0–3.0)
Basophils Absolute: 0 10*3/uL (ref 0.0–0.1)
Eosinophils Absolute: 0.2 10*3/uL (ref 0.0–0.7)
Eosinophils Relative: 1.9 % (ref 0.0–5.0)
HCT: 40.6 % (ref 39.0–52.0)
HEMOGLOBIN: 14.1 g/dL (ref 13.0–17.0)
LYMPHS PCT: 11.8 % — AB (ref 12.0–46.0)
Lymphs Abs: 1 10*3/uL (ref 0.7–4.0)
MCHC: 34.7 g/dL (ref 30.0–36.0)
MCV: 94.6 fl (ref 78.0–100.0)
MONOS PCT: 8.5 % (ref 3.0–12.0)
Monocytes Absolute: 0.7 10*3/uL (ref 0.1–1.0)
NEUTROS ABS: 6.5 10*3/uL (ref 1.4–7.7)
Neutrophils Relative %: 77.5 % — ABNORMAL HIGH (ref 43.0–77.0)
Platelets: 244 10*3/uL (ref 150.0–400.0)
RBC: 4.3 Mil/uL (ref 4.22–5.81)
RDW: 14.6 % (ref 11.5–15.5)
WBC: 8.4 10*3/uL (ref 4.0–10.5)

## 2015-03-01 LAB — PSA: PSA: 2.71 ng/mL (ref 0.10–4.00)

## 2015-03-01 LAB — URINALYSIS, ROUTINE W REFLEX MICROSCOPIC
Bilirubin Urine: NEGATIVE
Hgb urine dipstick: NEGATIVE
KETONES UR: NEGATIVE
Leukocytes, UA: NEGATIVE
Nitrite: NEGATIVE
PH: 6 (ref 5.0–8.0)
Specific Gravity, Urine: 1.01 (ref 1.000–1.030)
Total Protein, Urine: NEGATIVE
URINE GLUCOSE: NEGATIVE
UROBILINOGEN UA: 0.2 (ref 0.0–1.0)

## 2015-03-01 LAB — TSH: TSH: 0.46 u[IU]/mL (ref 0.35–4.50)

## 2015-03-01 LAB — HEPATIC FUNCTION PANEL
ALT: 17 U/L (ref 0–53)
AST: 15 U/L (ref 0–37)
Albumin: 4.2 g/dL (ref 3.5–5.2)
Alkaline Phosphatase: 101 U/L (ref 39–117)
BILIRUBIN DIRECT: 0.1 mg/dL (ref 0.0–0.3)
Total Bilirubin: 0.5 mg/dL (ref 0.2–1.2)
Total Protein: 6.9 g/dL (ref 6.0–8.3)

## 2015-03-01 LAB — MICROALBUMIN / CREATININE URINE RATIO
CREATININE, U: 75.2 mg/dL
MICROALB/CREAT RATIO: 1.6 mg/g (ref 0.0–30.0)
Microalb, Ur: 1.2 mg/dL (ref 0.0–1.9)

## 2015-03-01 LAB — BASIC METABOLIC PANEL
BUN: 17 mg/dL (ref 6–23)
CALCIUM: 9.2 mg/dL (ref 8.4–10.5)
CHLORIDE: 105 meq/L (ref 96–112)
CO2: 34 meq/L — AB (ref 19–32)
CREATININE: 0.78 mg/dL (ref 0.40–1.50)
GFR: 107.65 mL/min (ref >60.00)
Glucose, Bld: 112 mg/dL — ABNORMAL HIGH (ref 70–99)
Potassium: 4 mEq/L (ref 3.5–5.1)
Sodium: 141 mEq/L (ref 135–145)

## 2015-03-01 LAB — HEMOGLOBIN A1C: HEMOGLOBIN A1C: 5.9 % (ref 4.6–6.5)

## 2015-03-26 ENCOUNTER — Other Ambulatory Visit: Payer: Self-pay | Admitting: Internal Medicine

## 2015-04-16 ENCOUNTER — Encounter: Payer: Self-pay | Admitting: Internal Medicine

## 2015-04-16 ENCOUNTER — Telehealth: Payer: Self-pay | Admitting: Acute Care

## 2015-04-16 ENCOUNTER — Ambulatory Visit (INDEPENDENT_AMBULATORY_CARE_PROVIDER_SITE_OTHER): Payer: 59 | Admitting: Internal Medicine

## 2015-04-16 VITALS — BP 128/80 | HR 70 | Temp 98.3°F | Resp 18 | Ht 73.0 in | Wt 272.1 lb

## 2015-04-16 DIAGNOSIS — E119 Type 2 diabetes mellitus without complications: Secondary | ICD-10-CM | POA: Diagnosis not present

## 2015-04-16 DIAGNOSIS — Z23 Encounter for immunization: Secondary | ICD-10-CM

## 2015-04-16 DIAGNOSIS — Z0000 Encounter for general adult medical examination without abnormal findings: Secondary | ICD-10-CM | POA: Diagnosis not present

## 2015-04-16 DIAGNOSIS — Z720 Tobacco use: Secondary | ICD-10-CM

## 2015-04-16 DIAGNOSIS — F17200 Nicotine dependence, unspecified, uncomplicated: Secondary | ICD-10-CM

## 2015-04-16 MED ORDER — ATORVASTATIN CALCIUM 20 MG PO TABS: 20.0000 mg | ORAL_TABLET | Freq: Every day | ORAL | Status: DC

## 2015-04-16 MED ORDER — NABUMETONE 500 MG PO TABS: ORAL_TABLET | ORAL | Status: DC

## 2015-04-16 NOTE — Assessment & Plan Note (Signed)
stable overall by history and exam, recent data reviewed with pt, and pt to continue medical treatment as before,  to f/u any worsening symptoms or concerns Lab Results  Component Value Date   HGBA1C 5.9 03/01/2015

## 2015-04-16 NOTE — Progress Notes (Signed)
Pre visit review using our clinic review tool, if applicable. No additional management support is needed unless otherwise documented below in the visit note. 

## 2015-04-16 NOTE — Progress Notes (Signed)
Subjective:    Patient ID: Henry Ellis, male    DOB: [birth year], 59 y.o.   MRN: [id number]  HPI  Here for wellness and f/u;  Overall doing ok;  Pt denies Chest pain, worsening SOB, DOE, wheezing, orthopnea, PND, worsening LE edema, palpitations, dizziness or syncope.  Pt denies neurological change such as new headache, facial or extremity weakness.  Pt denies polydipsia, polyuria, or low sugar symptoms. Pt states overall good compliance with treatment and medications, good tolerability, and has been trying to follow appropriate diet.  Pt denies worsening depressive symptoms, suicidal ideation or panic. No fever, night sweats, wt loss, loss of appetite, or other constitutional symptoms.  Pt states good ability with ADL's, has low fall risk, home safety reviewed and adequate, no other significant changes in hearing or vision, and only occasionally active with exercise. No other complaints Wt Readings from Last 3 Encounters:  04/16/15 272 lb 1.3 oz (123.415 kg)  10/09/14 274 lb (124.286 kg)  04/03/14 276 lb (125.193 kg)  For start low dose CT lung Ca screening;  Per CMS guidelines, I have determined the eligibility including patient age (46-80), and absence of any signs of lung cancer.  Specific calculation of number of pack years is documented.  Quit smoking years is documented.  Shared decision making engaged today, including a discussion of the benefits and harms of screening, discussion of need for followup with additional testing, risks of over-diagnosis, risk of false-positive screening examinations, and risk of radiation exposure.  Counseling done today on the importance of adherence to annual lunge cancer LDCT screening, importance of quit smoking and remaining quit, and tobacco cessation instructions given.  There is also discussion with patient regarding the impact of comorbidities, and patient states is able and willing to undergo diagnosis and treatment. Past Medical History  Diagnosis Date    . History of Bell's palsy   . Esophageal reflux   . ED (erectile dysfunction)   . Chronic bronchitis   . Barrett's esophagus   . Ulcerative esophagitis   . IBS (irritable bowel syndrome)   . Personal history of colonic polyps 04/15/2008 & 04/22/2012    TUBULAR ADENOMA  . Anxiety   . Type II or unspecified type diabetes mellitus without mention of complication, not stated as uncontrolled   . Carpal tunnel syndrome, right 04/21/2011  . Hiatal hernia   . Esophageal stricture   . H/O adenomatous polyp of colon 06/05/2012   Past Surgical History  Procedure Laterality Date  . Inguinal hernia repair      bilateral  . Knee arthroscopy      right [name]  . Carpal tunnel release      left    reports that he has been smoking Cigarettes.  He has been smoking about 1.00 pack per day. He has never used smokeless tobacco. He reports that he drinks alcohol. He reports that he uses illicit drugs (Marijuana) about twice per week. family history includes Heart disease in his father; Hypertension in his brother; Lung cancer in his father. There is no history of Colon cancer. Allergies  Allergen Reactions  . Bee Venom Anaphylaxis  . Aspirin Nausea Only  . Amoxicillin Rash   Current Outpatient Prescriptions on File Prior to Visit  Medication Sig Dispense Refill  . Ascorbic Acid (VITAMIN C PO) Take 1 tablet by mouth daily.      . Cholecalciferol (VITAMIN D3) 1000 UNITS CAPS Take 1 tablet by mouth daily.      Marland Kitchen  Ferrous Sulfate (FE-CAPS) 250 MG CPCR Take 1 capsule by mouth daily.      . Glucosamine HCl 1500 MG TABS Take 2 tablets by mouth daily.      . Multiple Vitamin (MULTIVITAMINS PO) Take 1 tablet by mouth daily.      Marland Kitchen omeprazole (PRILOSEC OTC) 20 MG tablet Take 20 mg by mouth daily.    . Psyllium (METAMUCIL) 30.9 % POWD Take by mouth daily.       No current facility-administered medications on file prior to visit.   Review of Systems Constitutional: Negative for increased diaphoresis,  other activity, appetite or siginficant weight change other than noted HENT: Negative for worsening hearing loss, ear pain, facial swelling, mouth sores and neck stiffness.   Eyes: Negative for other worsening pain, redness or visual disturbance.  Respiratory: Negative for shortness of breath and wheezing  Cardiovascular: Negative for chest pain and palpitations.  Gastrointestinal: Negative for diarrhea, blood in stool, abdominal distention or other pain Genitourinary: Negative for hematuria, flank pain or change in urine volume.  Musculoskeletal: Negative for myalgias or other joint complaints.  Skin: Negative for color change and wound or drainage.  Neurological: Negative for syncope and numbness. other than noted Hematological: Negative for adenopathy. or other swelling Psychiatric/Behavioral: Negative for hallucinations, SI, self-injury, decreased concentration or other worsening agitation.      Objective:   Physical Exam BP 128/80 mmHg  Pulse 70  Temp(Src) 98.3 F (36.8 C) (Oral)  Resp 18  Ht 6\' 1"  (1.854 m)  Wt 272 lb 1.3 oz (123.415 kg)  BMI 35.90 kg/m2  SpO2 97% VS noted,  Constitutional: Pt is oriented to person, place, and time. Appears well-developed and well-nourished, in no significant distress Head: Normocephalic and atraumatic.  Right Ear: External ear normal.  Left Ear: External ear normal.  Nose: Nose normal.  Mouth/Throat: Oropharynx is clear and moist.  Eyes: Conjunctivae and EOM are normal. Pupils are equal, round, and reactive to light.  Neck: Normal range of motion. Neck supple. No JVD present. No tracheal deviation present or significant neck LA or mass Cardiovascular: Normal rate, regular rhythm, normal heart sounds and intact distal pulses.   Pulmonary/Chest: Effort normal and breath sounds without rales or wheezing  Abdominal: Soft. Bowel sounds are normal. NT. No HSM  Musculoskeletal: Normal range of motion. Exhibits no edema.  Lymphadenopathy:  Has no  cervical adenopathy.  Neurological: Pt is alert and oriented to person, place, and time. Pt has normal reflexes. No cranial nerve deficit. Motor grossly intact Skin: Skin is warm and dry. No rash noted.  Psychiatric:  Has normal mood and affect. Behavior is normal.  Lab Results  Component Value Date   WBC 8.4 03/01/2015   HGB 14.1 03/01/2015   HCT 40.6 03/01/2015   PLT 244.0 03/01/2015   GLUCOSE 112* 03/01/2015   CHOL 154 03/01/2015   TRIG 133.0 03/01/2015   HDL 63.00 03/01/2015   LDLDIRECT 151.7 03/25/2010   LDLCALC 64 03/01/2015   ALT 17 03/01/2015   AST 15 03/01/2015   NA 141 03/01/2015   K 4.0 03/01/2015   CL 105 03/01/2015   CREATININE 0.78 03/01/2015   BUN 17 03/01/2015   CO2 34* 03/01/2015   TSH 0.46 03/01/2015   PSA 2.71 03/01/2015   HGBA1C 5.9 03/01/2015   MICROALBUR 1.2 03/01/2015         Assessment & Plan:

## 2015-04-16 NOTE — Addendum Note (Signed)
Addended by: Valerie Salts on: 04/16/2015 05:43 PM   Modules accepted: Orders

## 2015-04-16 NOTE — Telephone Encounter (Signed)
I attempted to call Henry Ellis to schedule his LDCT Scan for lung cancer screening. This patient had a shared decision making meeting with his primary physician who ordered the referral to the screening program.There is documentation of the meeting in the medical record. I need to confirm pack years as there is documentation that the patient is a current smoker who smokes 1 pack per day, but there is not documentation of the number of years he has smoked.I left a message for the patient to return my call, with my contact information,so that we can confirm pack years and schedule the scan. I will await his return call.

## 2015-04-16 NOTE — Assessment & Plan Note (Signed)
Urged to quit, for LD CT scan chest

## 2015-04-16 NOTE — Patient Instructions (Addendum)
You had the shingles shot today  Please continue all other medications as before, and refills have been done if requested.  Please have the pharmacy call with any other refills you may need.  Please continue your efforts at being more active, low cholesterol diet, and weight control.  You are otherwise up to date with prevention measures today.  Please keep your appointments with your specialists as you may have planned  You will be contacted regarding the referral for: CT scan chest for lung cancer screening  Please remember to sign up for MyChart if you have not done so, as this will be important to you in the future with finding out test results, communicating by private email, and scheduling acute appointments online when needed.  Please return in 6 months, or sooner if needed, with Lab testing done 3-5 days before

## 2015-04-16 NOTE — Assessment & Plan Note (Signed)

## 2015-04-19 ENCOUNTER — Telehealth: Payer: Self-pay | Admitting: Acute Care

## 2015-04-19 NOTE — Telephone Encounter (Signed)
Henry Ellis had called and left a return message for my previous call. I have called him again, and have left yet another message for him to call me to schedule his LDCT. This patient has already had his SDMV with the Dr. Jenny Reichmann.

## 2015-04-20 ENCOUNTER — Telehealth: Payer: Self-pay | Admitting: Acute Care

## 2015-04-20 NOTE — Telephone Encounter (Signed)
I spoke with Mr. Enyeart and scheduled him for his LDCT. He understands that this is an annual scan so we can follow any nodules over time. He has had his shared decision making visit with Dr. Cathlean Cower. It is documented in the medical record. I will schedule the exam and follow up with the results. He has requested 04/29/15 at about 10 am. I will call the [facility] and check availability, and confirm the appointment with him.

## 2015-04-21 ENCOUNTER — Telehealth: Payer: Self-pay | Admitting: Acute Care

## 2015-04-21 NOTE — Telephone Encounter (Signed)
This patient is scheduled for a LDCT at the [facility] on 04/29/15 at 10 am. I left a message on his home phone with these details in addition to the address for the scanner at [address]. When I spoke with him yesterday we discussed that he knows where to go.I have asked that he return my call to confirm that he got this information.I will follow up on the scan results as soon as they are available to me.

## 2015-04-28 ENCOUNTER — Telehealth: Payer: Self-pay | Admitting: Acute Care

## 2015-04-28 NOTE — Telephone Encounter (Signed)
Patient reminded of appointment tomorrow. He verbalized understanding of time and location of the scan.

## 2015-04-29 ENCOUNTER — Ambulatory Visit (INDEPENDENT_AMBULATORY_CARE_PROVIDER_SITE_OTHER)
Admission: RE | Admit: 2015-04-29 | Discharge: 2015-04-29 | Disposition: A | Discharge status: Home / Self Care | Payer: 59 | Source: Ambulatory Visit | Attending: Internal Medicine | Admitting: Internal Medicine

## 2015-04-29 DIAGNOSIS — F17200 Nicotine dependence, unspecified, uncomplicated: Secondary | ICD-10-CM

## 2015-04-29 DIAGNOSIS — Z720 Tobacco use: Secondary | ICD-10-CM

## 2015-04-30 ENCOUNTER — Telehealth: Payer: Self-pay | Admitting: Acute Care

## 2015-04-30 NOTE — Telephone Encounter (Signed)
Attempted to call Mr. Henry Ellis with his scan results. He did not answer. I will call again Monday morning.

## 2015-05-03 ENCOUNTER — Telehealth: Payer: Self-pay | Admitting: Acute Care

## 2015-05-03 ENCOUNTER — Telehealth: Payer: Self-pay | Admitting: Internal Medicine

## 2015-05-03 NOTE — Telephone Encounter (Signed)
Patient states he had a CT on May 12 and that Dr. Jenny Reichmann wanted him to follow up in three months.  Patient states that he has to have one more CT in three months and has an appointment scheduled with Dr. Jenny Reichmann in November.  He would like to know if it would be okay to follow up on both at that appointment?

## 2015-05-03 NOTE — Telephone Encounter (Signed)
I called Henry Ellis to give him the results of his Low Dose Lung cancer screening CT.There was no answer. I left a message asking that he return my call so that we can discuss the results. I gave him my contact information in the recording. I will await his return call.

## 2015-05-03 NOTE — Telephone Encounter (Signed)
I called Henry Ellis and explained that his LDCT showed some nodules that will need a follow up CT in three months to re-evaluate. He verbalized understanding. I told him I will call him in August to schedule his scan around the 12th of the month. He verbalized understanding. He has my contact information in the event he has any further questions or concerns.

## 2015-05-04 NOTE — Telephone Encounter (Signed)
I would not wait until November, as the CT was somewhat suspicious, and I would not want to wait to November to catch a lung cancer that could have been diagnosed sooner

## 2015-05-05 NOTE — Telephone Encounter (Signed)
Message left on home VM

## 2015-05-06 NOTE — Telephone Encounter (Signed)
Patient called back. I read the message that Dr. Jenny Reichmann had typed. He said that he has a CT scheduled for August, but will try to get one scheduled sooner. He is aware that he has a follow up appt with Dr. Jenny Reichmann in November unless something changes.

## 2015-05-10 ENCOUNTER — Telehealth: Payer: Self-pay | Admitting: Acute Care

## 2015-05-10 NOTE — Telephone Encounter (Signed)
Mr. Henry Ellis called me this afternoon about his follow up CT scan. Per the Lung RADS guidelines the 4 A Lung RADS nodule seen on the Apr 29, 2015 scan should be followed with a repeat scan in 3 months from that date, which will be 07/30/15. I had planned on repeating the scan at that time. Mr. Henry Ellis  had received a message from Dr. Jeneen Rinks John's office about repeating the scan even sooner, which has confused him. I have sent a message to Dr. Jenny Reichmann so that we can clarify the plan of care for this patient. I have told Mr. Henry Ellis  I will call him as soon as I have had a reply to my message from Dr. Jenny Reichmann.He verbalized understanding, and will call me if he has any further questions.

## 2015-05-11 ENCOUNTER — Telehealth: Payer: Self-pay | Admitting: Acute Care

## 2015-05-11 NOTE — Telephone Encounter (Signed)
I called and left a message for Mr. Henry Ellis. I have been in contact with Dr. Jenny Reichmann through messenger, and he agrees that the 3 month window to repeat this scan is appropriate. We will repeat the scan in August 2016. Mr. Henry Ellis is aware and has my contact information if he has any questions.We had discussed this yesterday..I left a message today to confirm.

## 2015-07-23 ENCOUNTER — Telehealth: Payer: Self-pay | Admitting: Acute Care

## 2015-07-23 NOTE — Telephone Encounter (Signed)
I called and left a message and my contact information, requesting Henry Ellis return my call so that we can get his 3 month follow up scan scheduled. I will await his return call.

## 2015-08-02 ENCOUNTER — Telehealth: Payer: Self-pay | Admitting: Acute Care

## 2015-08-02 ENCOUNTER — Other Ambulatory Visit: Payer: Self-pay | Admitting: Acute Care

## 2015-08-02 DIAGNOSIS — F17210 Nicotine dependence, cigarettes, uncomplicated: Secondary | ICD-10-CM

## 2015-08-02 NOTE — Telephone Encounter (Signed)
I called and left another message for Henry Ellis regarding the scheduling of his follow up scan. I have left my contact number and asked him to return my call. I will await his return call.

## 2015-08-02 NOTE — Telephone Encounter (Signed)
Henry Ellis returned my earlier call. We have him scheduled for his repeat CT scan on 08/16/15 at 0900 at [place] at [address]. He verbalized understanding of both time and location of the scan.

## 2015-08-16 ENCOUNTER — Telehealth: Payer: Self-pay | Admitting: Acute Care

## 2015-08-16 ENCOUNTER — Ambulatory Visit (INDEPENDENT_AMBULATORY_CARE_PROVIDER_SITE_OTHER)
Admission: RE | Admit: 2015-08-16 | Discharge: 2015-08-16 | Disposition: A | Discharge status: Home / Self Care | Payer: 59 | Source: Ambulatory Visit | Attending: Acute Care | Admitting: Acute Care

## 2015-08-16 DIAGNOSIS — F17210 Nicotine dependence, cigarettes, uncomplicated: Secondary | ICD-10-CM | POA: Diagnosis not present

## 2015-08-16 NOTE — Telephone Encounter (Signed)
I called Henry Ellis with the results of his low dose CT. This is  the 3 month follow up of a 4A Lung RADS scan. I explained that the nodule of concern has not had any further growth, and was actually slightly smaller.I told him that his scan was read as a Lung RADS 2 which is a nodule with a very low likelihood of becoming a clinically active cancer based on size and lack of growth. I explained that the recommendation was for a follow up scan in 12 months. I have also explained to him that I have shared the results of the scan with Dr. Cathlean Cower.He verbalized understanding of the results and had no further questions for me . He has my contact information in the event he has any questions in the future.

## 2015-10-11 ENCOUNTER — Other Ambulatory Visit (INDEPENDENT_AMBULATORY_CARE_PROVIDER_SITE_OTHER): Payer: 59

## 2015-10-11 DIAGNOSIS — E119 Type 2 diabetes mellitus without complications: Secondary | ICD-10-CM | POA: Diagnosis not present

## 2015-10-11 LAB — BASIC METABOLIC PANEL
BUN: 15 mg/dL (ref 6–23)
CALCIUM: 9.6 mg/dL (ref 8.4–10.5)
CO2: 31 mEq/L (ref 19–32)
Chloride: 103 mEq/L (ref 96–112)
Creatinine, Ser: 0.84 mg/dL (ref 0.40–1.50)
GFR: 98.62 mL/min (ref >60.00)
Glucose, Bld: 156 mg/dL — ABNORMAL HIGH (ref 70–99)
POTASSIUM: 3.5 meq/L (ref 3.5–5.1)
SODIUM: 142 meq/L (ref 135–145)

## 2015-10-11 LAB — HEMOGLOBIN A1C: HEMOGLOBIN A1C: 5.9 % (ref 4.6–6.5)

## 2015-10-11 LAB — HEPATIC FUNCTION PANEL
ALBUMIN: 4.1 g/dL (ref 3.5–5.2)
ALT: 17 U/L (ref 0–53)
AST: 15 U/L (ref 0–37)
Alkaline Phosphatase: 104 U/L (ref 39–117)
BILIRUBIN TOTAL: 0.5 mg/dL (ref 0.2–1.2)
Bilirubin, Direct: 0.1 mg/dL (ref 0.0–0.3)
Total Protein: 7.4 g/dL (ref 6.0–8.3)

## 2015-10-11 LAB — LIPID PANEL
CHOLESTEROL: 169 mg/dL (ref 0–200)
HDL: 61.3 mg/dL (ref >39.00)
LDL CALC: 87 mg/dL (ref 0–99)
NONHDL: 107.57
Total CHOL/HDL Ratio: 3
Triglycerides: 101 mg/dL (ref 0.0–149.0)
VLDL: 20.2 mg/dL (ref 0.0–40.0)

## 2015-10-19 ENCOUNTER — Ambulatory Visit (INDEPENDENT_AMBULATORY_CARE_PROVIDER_SITE_OTHER): Payer: 59 | Admitting: Internal Medicine

## 2015-10-19 ENCOUNTER — Encounter: Payer: Self-pay | Admitting: Internal Medicine

## 2015-10-19 VITALS — BP 152/88 | HR 66 | Temp 97.7°F | Ht 73.0 in | Wt 282.0 lb

## 2015-10-19 DIAGNOSIS — E119 Type 2 diabetes mellitus without complications: Secondary | ICD-10-CM

## 2015-10-19 DIAGNOSIS — Z0000 Encounter for general adult medical examination without abnormal findings: Secondary | ICD-10-CM

## 2015-10-19 DIAGNOSIS — Z0189 Encounter for other specified special examinations: Secondary | ICD-10-CM

## 2015-10-19 DIAGNOSIS — E7800 Pure hypercholesterolemia, unspecified: Secondary | ICD-10-CM | POA: Diagnosis not present

## 2015-10-19 DIAGNOSIS — I10 Essential (primary) hypertension: Secondary | ICD-10-CM | POA: Diagnosis not present

## 2015-10-19 HISTORY — DX: Essential (primary) hypertension: I10

## 2015-10-19 MED ORDER — LOSARTAN POTASSIUM 50 MG PO TABS: 50.0000 mg | ORAL_TABLET | Freq: Every day | ORAL | Status: DC

## 2015-10-19 NOTE — Progress Notes (Signed)
Pre visit review using our clinic review tool, if applicable. No additional management support is needed unless otherwise documented below in the visit note. 

## 2015-10-19 NOTE — Assessment & Plan Note (Signed)
stable overall by history and exam, recent data reviewed with pt, and pt to continue medical treatment as before,  to f/u any worsening symptoms or concerns Lab Results  Component Value Date   LDLCALC 87 10/11/2015

## 2015-10-19 NOTE — Patient Instructions (Addendum)
Please take all new medication as prescribed - the losartan at 50 mg per day  Please continue all other medications as before, and refills have been done if requested.  Please have the pharmacy call with any other refills you may need.  Please continue your efforts at being more active, low cholesterol diet, and weight control.  Please keep your appointments with your specialists as you may have planned  Please go to the LAB in the Basement (turn left off the elevator) for the tests to be done today  You will be contacted by phone if any changes need to be made immediately.  Otherwise, you will receive a letter about your results with an explanation, but please check with MyChart first.  Please remember to sign up for MyChart if you have not done so, as this will be important to you in the future with finding out test results, communicating by private email, and scheduling acute appointments online when needed.  Please return in 6 months, or sooner if needed, with Lab testing done 3-5 days before

## 2015-10-19 NOTE — Assessment & Plan Note (Signed)
Mild uncontroled - for start losartan 50 qd,  to f/u any worsening symptoms or concerns  . BP Readings from Last 3 Encounters:  10/19/15 152/88  04/16/15 128/80  10/09/14 132/84

## 2015-10-19 NOTE — Assessment & Plan Note (Signed)
stable overall by history and exam, recent data reviewed with pt, and pt to continue medical treatment as before,  to f/u any worsening symptoms or concerns Lab Results  Component Value Date   HGBA1C 5.9 10/11/2015

## 2015-10-19 NOTE — Progress Notes (Signed)
Subjective:    Patient ID: Henry Ellis, male    DOB: 1956-12-10, 59 y.o.   MRN: [id number]  HPI  Here to f/u; overall doing ok,  Pt denies chest pain, increasing sob or doe, wheezing, orthopnea, PND, increased LE swelling, palpitations, dizziness or syncope.  Pt denies new neurological symptoms such as new headache, or facial or extremity weakness or numbness.  Pt denies polydipsia, polyuria, or low sugar episode.   Pt denies new neurological symptoms such as new headache, or facial or extremity weakness or numbness.   Pt states overall good compliance with meds, mostly trying to follow appropriate diet, with wt overall stable,  but little exercise however. Irritable over work. Wt Readings from Last 3 Encounters:  10/19/15 282 lb (127.914 kg)  04/16/15 272 lb 1.3 oz (123.415 kg)  10/09/14 274 lb (124.286 kg)   Past Medical History  Diagnosis Date  . History of Bell's palsy   . Esophageal reflux   . ED (erectile dysfunction)   . Chronic bronchitis   . Barrett's esophagus   . Ulcerative esophagitis   . IBS (irritable bowel syndrome)   . Personal history of colonic polyps 04/15/2008 & 04/22/2012    TUBULAR ADENOMA  . Anxiety   . Type II or unspecified type diabetes mellitus without mention of complication, not stated as uncontrolled   . Carpal tunnel syndrome, right 04/21/2011  . Hiatal hernia   . Esophageal stricture   . H/O adenomatous polyp of colon 06/05/2012   Past Surgical History  Procedure Laterality Date  . Inguinal hernia repair      bilateral  . Knee arthroscopy      right [name]  . Carpal tunnel release      left    reports that he has been smoking Cigarettes.  He has been smoking about 1.00 pack per day. He has never used smokeless tobacco. He reports that he drinks alcohol. He reports that he uses illicit drugs (Marijuana) about twice per week. family history includes Heart disease in his father; Hypertension in his brother; Lung cancer in his father. There is no  history of Colon cancer. Allergies  Allergen Reactions  . Bee Venom Anaphylaxis  . Aspirin Nausea Only  . Amoxicillin Rash   Current Outpatient Prescriptions on File Prior to Visit  Medication Sig Dispense Refill  . Ascorbic Acid (VITAMIN C PO) Take 1 tablet by mouth daily.      Marland Kitchen atorvastatin (LIPITOR) 20 MG tablet Take 1 tablet (20 mg total) by mouth daily. 90 tablet 3  . Cholecalciferol (VITAMIN D3) 1000 UNITS CAPS Take 1 tablet by mouth daily.      . Ferrous Sulfate (FE-CAPS) 250 MG CPCR Take 1 capsule by mouth daily.      . Glucosamine HCl 1500 MG TABS Take 2 tablets by mouth daily.      . Multiple Vitamin (MULTIVITAMINS PO) Take 1 tablet by mouth daily.      . nabumetone (RELAFEN) 500 MG tablet TAKE 1 TABLET BY MOUTH TWICE DAILY as needed for pain 60 tablet 5  . omeprazole (PRILOSEC OTC) 20 MG tablet Take 20 mg by mouth daily.    . Psyllium (METAMUCIL) 30.9 % POWD Take by mouth daily.       No current facility-administered medications on file prior to visit.     Review of Systems  Constitutional: Negative for unusual diaphoresis or night sweats HENT: Negative for ringing in ear or discharge Eyes: Negative for double vision or  worsening visual disturbance.  Respiratory: Negative for choking and stridor.   Gastrointestinal: Negative for vomiting or other signifcant bowel change Genitourinary: Negative for hematuria or change in urine volume.  Musculoskeletal: Negative for other MSK pain or swelling Skin: Negative for color change and worsening wound.  Neurological: Negative for tremors and numbness other than noted  Psychiatric/Behavioral: Negative for decreased concentration or agitation other than above       Objective:   Physical Exam BP 152/88 mmHg  Pulse 66  Temp(Src) 97.7 F (36.5 C) (Oral)  Ht 6\' 1"  (1.854 m)  Wt 282 lb (127.914 kg)  BMI 37.21 kg/m2  SpO2 96% VS noted,  Constitutional: Pt appears in no significant distress HENT: Head: NCAT.  Right Ear:  External ear normal.  Left Ear: External ear normal.  Eyes: . Pupils are equal, round, and reactive to light. Conjunctivae and EOM are normal Neck: Normal range of motion. Neck supple.  Cardiovascular: Normal rate and regular rhythm.   Pulmonary/Chest: Effort normal and breath sounds without rales or wheezing.  Abd:  Soft, NT, ND, + BS Neurological: Pt is alert. Not confused , motor grossly intact Skin: Skin is warm. No rash, no LE edema Psychiatric: Pt behavior is normal. No agitation.      Assessment & Plan:

## 2015-10-22 ENCOUNTER — Other Ambulatory Visit (INDEPENDENT_AMBULATORY_CARE_PROVIDER_SITE_OTHER): Payer: 59

## 2015-10-22 DIAGNOSIS — Z0000 Encounter for general adult medical examination without abnormal findings: Secondary | ICD-10-CM

## 2015-10-22 DIAGNOSIS — E119 Type 2 diabetes mellitus without complications: Secondary | ICD-10-CM | POA: Diagnosis not present

## 2015-10-22 DIAGNOSIS — Z0189 Encounter for other specified special examinations: Secondary | ICD-10-CM

## 2015-10-22 LAB — HEPATIC FUNCTION PANEL
ALBUMIN: 3.9 g/dL (ref 3.5–5.2)
ALT: 17 U/L (ref 0–53)
AST: 16 U/L (ref 0–37)
Alkaline Phosphatase: 100 U/L (ref 39–117)
Bilirubin, Direct: 0.1 mg/dL (ref 0.0–0.3)
TOTAL PROTEIN: 6.6 g/dL (ref 6.0–8.3)
Total Bilirubin: 0.3 mg/dL (ref 0.2–1.2)

## 2015-10-22 LAB — BASIC METABOLIC PANEL
BUN: 13 mg/dL (ref 6–23)
CALCIUM: 9 mg/dL (ref 8.4–10.5)
CO2: 31 meq/L (ref 19–32)
CREATININE: 0.85 mg/dL (ref 0.40–1.50)
Chloride: 104 mEq/L (ref 96–112)
GFR: 97.27 mL/min (ref >60.00)
GLUCOSE: 103 mg/dL — AB (ref 70–99)
Potassium: 3.6 mEq/L (ref 3.5–5.1)
SODIUM: 142 meq/L (ref 135–145)

## 2015-10-22 LAB — HEMOGLOBIN A1C: HEMOGLOBIN A1C: 6 % (ref 4.6–6.5)

## 2015-10-22 LAB — CBC WITH DIFFERENTIAL/PLATELET
BASOS ABS: 0.1 10*3/uL (ref 0.0–0.1)
Basophils Relative: 0.8 % (ref 0.0–3.0)
EOS ABS: 0.2 10*3/uL (ref 0.0–0.7)
Eosinophils Relative: 2.5 % (ref 0.0–5.0)
HCT: 38.8 % — ABNORMAL LOW (ref 39.0–52.0)
Hemoglobin: 13 g/dL (ref 13.0–17.0)
LYMPHS ABS: 1.6 10*3/uL (ref 0.7–4.0)
Lymphocytes Relative: 19.6 % (ref 12.0–46.0)
MCHC: 33.6 g/dL (ref 30.0–36.0)
MCV: 95.8 fl (ref 78.0–100.0)
MONO ABS: 0.9 10*3/uL (ref 0.1–1.0)
Monocytes Relative: 10.8 % (ref 3.0–12.0)
NEUTROS ABS: 5.5 10*3/uL (ref 1.4–7.7)
NEUTROS PCT: 66.3 % (ref 43.0–77.0)
PLATELETS: 244 10*3/uL (ref 150.0–400.0)
RBC: 4.04 Mil/uL — ABNORMAL LOW (ref 4.22–5.81)
RDW: 14.1 % (ref 11.5–15.5)
WBC: 8.3 10*3/uL (ref 4.0–10.5)

## 2015-10-22 LAB — LIPID PANEL
CHOLESTEROL: 136 mg/dL (ref 0–200)
HDL: 51.5 mg/dL (ref >39.00)
LDL CALC: 67 mg/dL (ref 0–99)
NONHDL: 84.85
Total CHOL/HDL Ratio: 3
Triglycerides: 89 mg/dL (ref 0.0–149.0)
VLDL: 17.8 mg/dL (ref 0.0–40.0)

## 2015-10-22 LAB — TSH: TSH: 0.52 u[IU]/mL (ref 0.35–4.50)

## 2015-10-22 LAB — PSA: PSA: 2.9 ng/mL (ref 0.10–4.00)

## 2015-12-19 DIAGNOSIS — N189 Chronic kidney disease, unspecified: Secondary | ICD-10-CM

## 2015-12-19 HISTORY — DX: Chronic kidney disease, unspecified: N18.9

## 2016-02-02 ENCOUNTER — Telehealth: Payer: Self-pay | Admitting: *Deleted

## 2016-02-02 MED ORDER — NABUMETONE 500 MG PO TABS: ORAL_TABLET | ORAL | Status: DC

## 2016-02-02 NOTE — Telephone Encounter (Signed)
Left msg on triage requesting refill on his Nabumetone to [facility] on [address].Called pt no answer LMOM rx sent to pharmacy.. Marland KitchenJohny Chess

## 2016-03-03 IMAGING — CT CT CHEST LUNG CANCER SCREENING LOW DOSE W/O CM
2 of 5 series · 15 of 40 positions shown, 18 images · non-contrast
Comparison: Low-dose lung cancer screening chest CT 04/29/2015.

CLINICAL DATA: 59-year-old male current smoker with 35 pack-year
history of smoking. Lung cancer screening examination. Followup
study to prior Lung-RADS 4A examination.

EXAM:
CT CHEST WITHOUT CONTRAST
TECHNIQUE: Multidetector CT imaging of the chest was performed following the
standard protocol without IV contrast.

[Series 5: lungs thins for pacs · axial · 0.85mm/px · z∈[-306,-18]mm · 12 of 320 slices shown, 15 images]
[im 16/320  mediastinal]
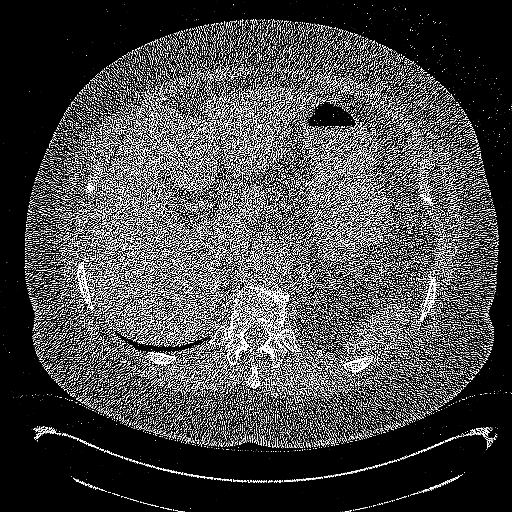
[im 16/320  lung]
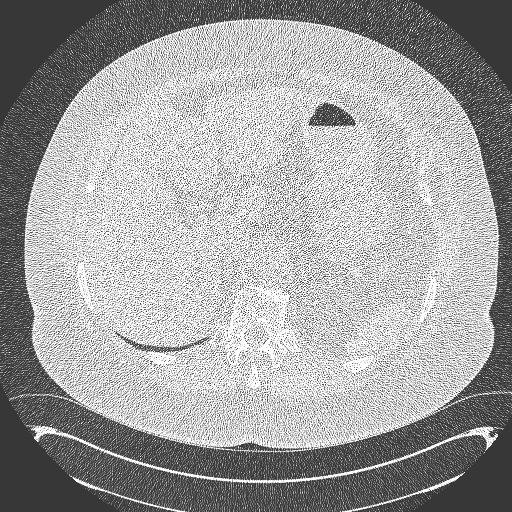
[im 48/320  lung]
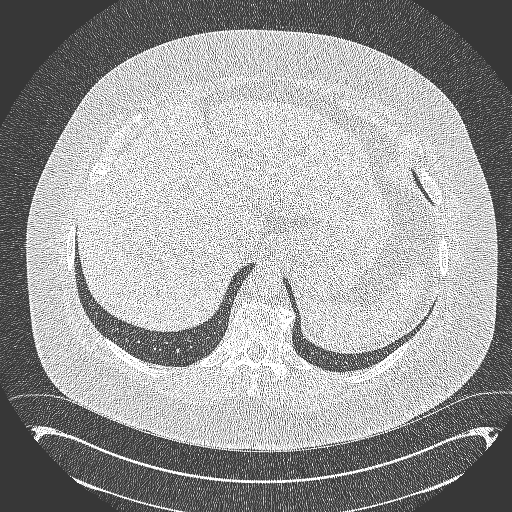
[im 64/320  lung]
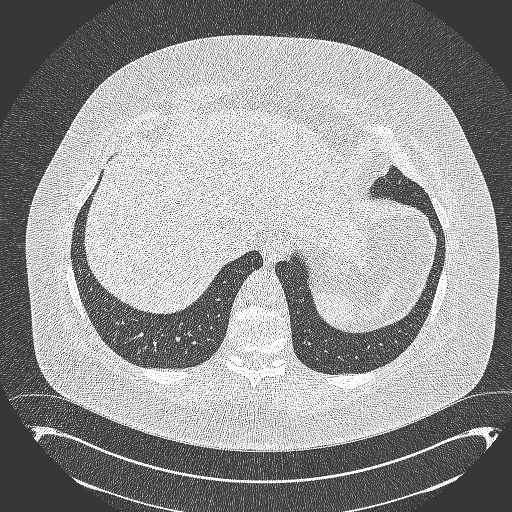
[im 96/320  lung]
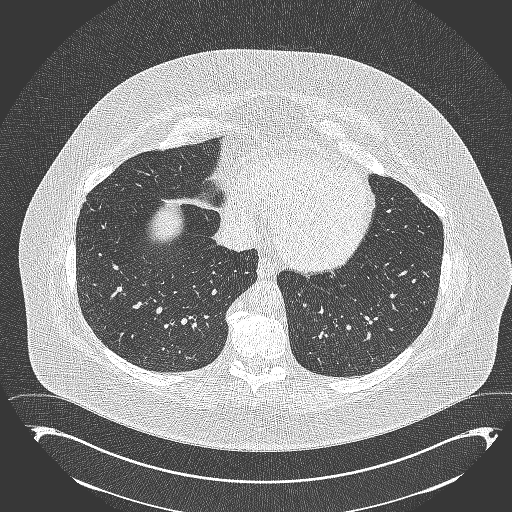
[im 128/320  mediastinal]
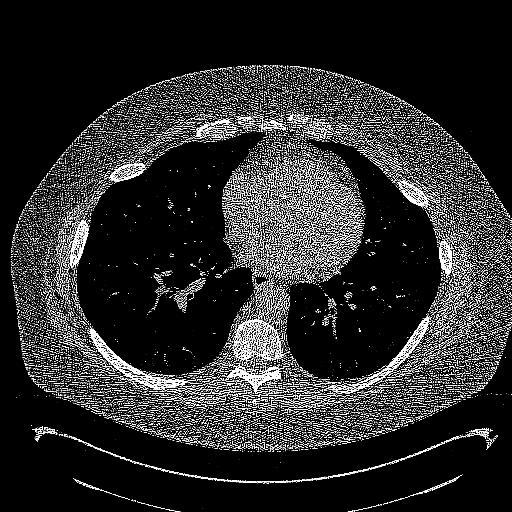
[im 128/320  lung]
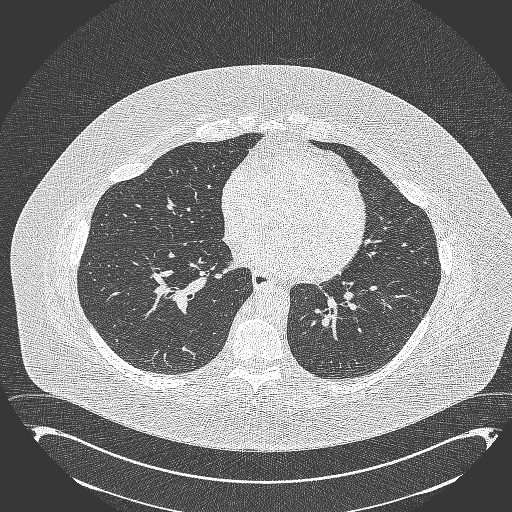
[im 144/320  lung]
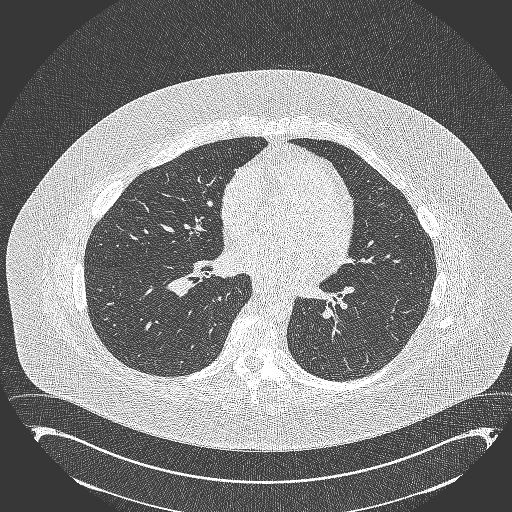
[im 176/320  lung]
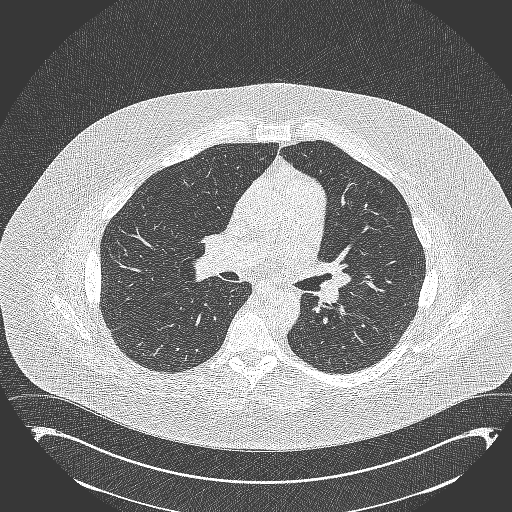
[im 192/320  lung]
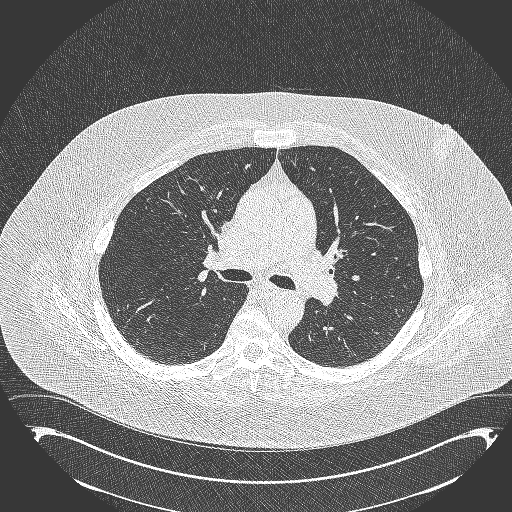
[im 224/320  mediastinal]
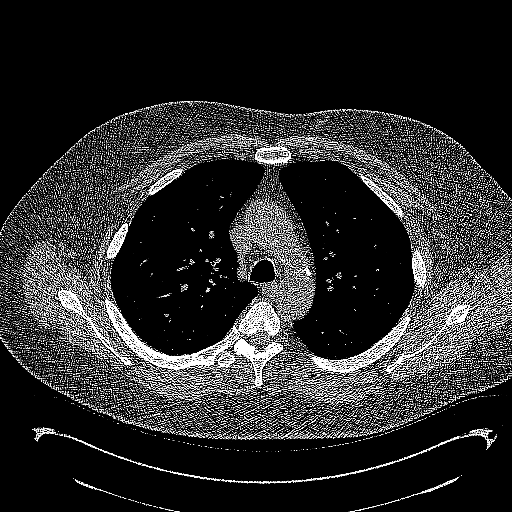
[im 224/320  lung]
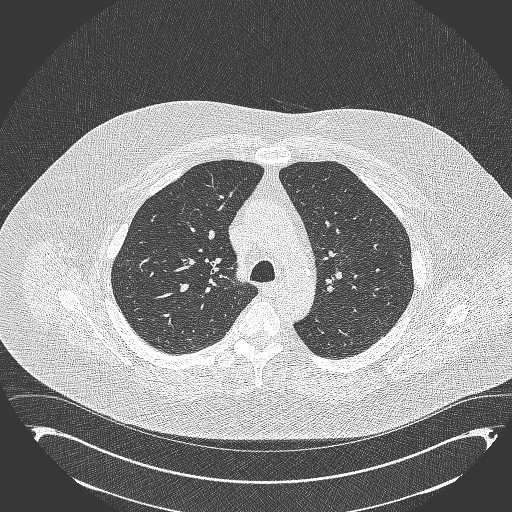
[im 256/320  lung]
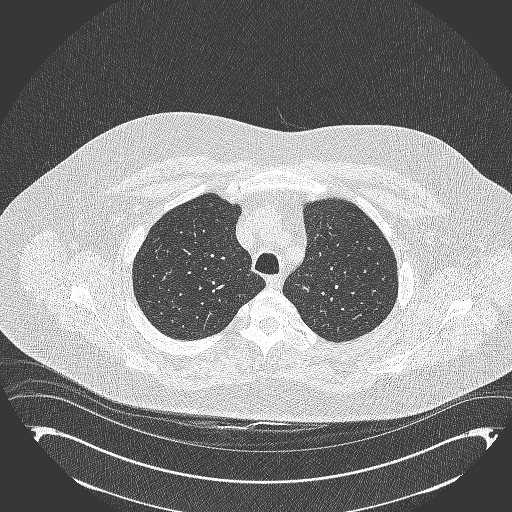
[im 272/320  lung]
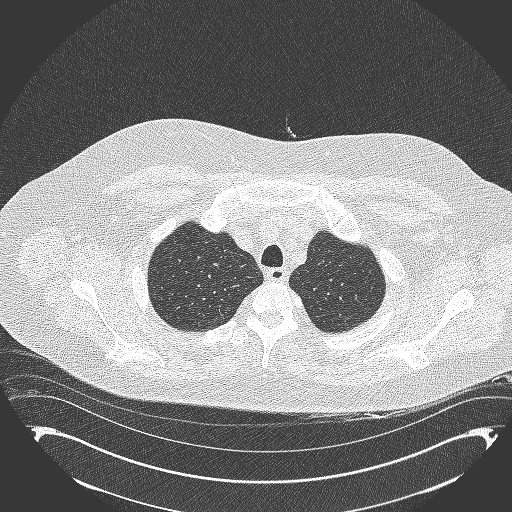
[im 304/320  lung]
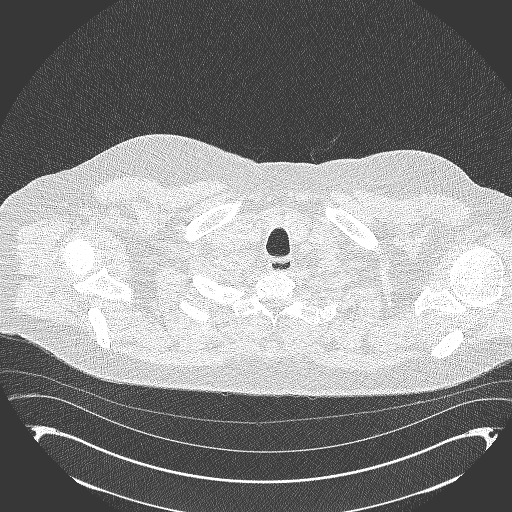

[Series 602: cor · coronal · 0.85mm/px · 3 of 125 slices shown]
[im 25/125  lung]
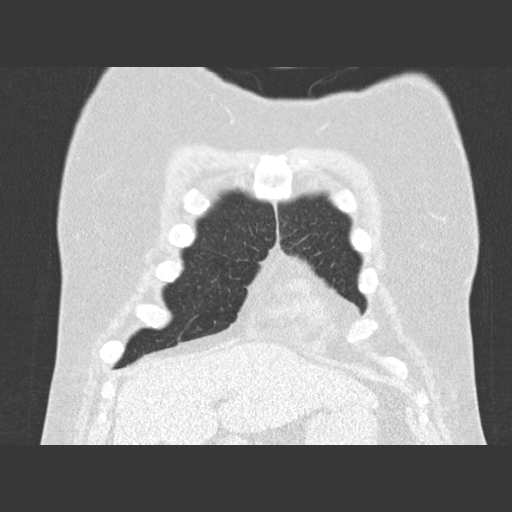
[im 50/125  lung]
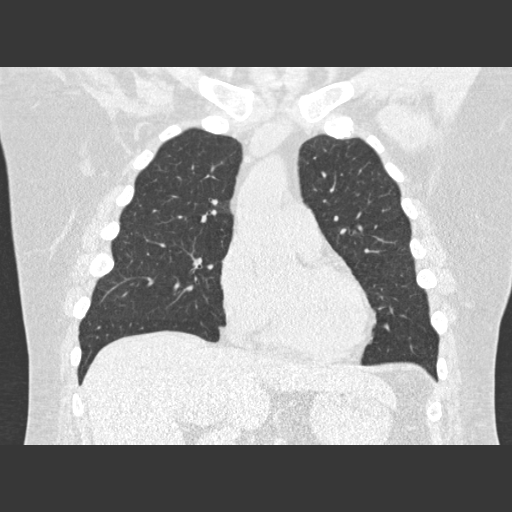
[im 75/125  lung]
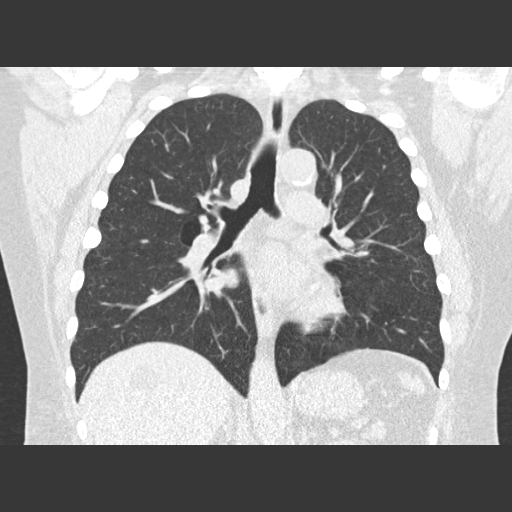

[15 of 40 positions shown; findings below may reference images not displayed]

FINDINGS: Mediastinum/Lymph Nodes: There is no significant pericardial fluid,
thickening or pericardial calcification. No pathologically enlarged
mediastinal or hilar lymph nodes. Please note that accurate
exclusion of hilar adenopathy is limited on noncontrast CT scans.
Esophagus is unremarkable in appearance. No axillary
lymphadenopathy.

Lungs/Pleura: Again noted is a very well-circumscribed round nodule
in the central aspect of the right upper lobe (image 107 of series
5), with a volume derived mean diameter of 9.4 mm (stable to
slightly decreased compared to prior examination 04/29/2015). No
other larger more suspicious appearing pulmonary nodules or masses
are otherwise noted. No acute consolidative airspace disease. No
pleural effusions. Mild diffuse bronchial wall thickening with mild
centrilobular emphysema.

Upper Abdomen: Well-defined 2.6 cm low-attenuation lesion in segment
7 of the liver is unchanged, and although incompletely characterized
on today's noncontrast CT examination, this is statistically likely
a cyst.

Musculoskeletal/Soft Tissues: There are no aggressive appearing
lytic or blastic lesions noted in the visualized portions of the
skeleton.
IMPRESSION: 1. Previously noted right upper lobe nodule is stable to slightly
decreased in size compared to the prior examination. Lung-RADS
Category 2, benign appearance or behavior. Continue annual screening
with low-dose chest CT without contrast in 12 months.
2. Mild diffuse bronchial wall thickening with very mild
centrilobular emphysema.

## 2016-03-08 ENCOUNTER — Other Ambulatory Visit: Payer: Self-pay | Admitting: Acute Care

## 2016-03-08 DIAGNOSIS — F17210 Nicotine dependence, cigarettes, uncomplicated: Secondary | ICD-10-CM

## 2016-03-19 ENCOUNTER — Other Ambulatory Visit: Payer: Self-pay | Admitting: Internal Medicine

## 2016-04-14 ENCOUNTER — Other Ambulatory Visit: Payer: 59

## 2016-04-14 ENCOUNTER — Telehealth: Payer: Self-pay

## 2016-04-14 NOTE — Telephone Encounter (Signed)
Please verify that pt does not have traditional medicare insurance, as this will not pay  I am no longer able to do this myself, since the "appts" icon has been removed from my access in the medical record

## 2016-04-14 NOTE — Telephone Encounter (Signed)
Spoke to patient, he does not have traditional medicare, told him he could come in one day next week before his appointment and he stated that he couldn't and would have it done on the day of the appointment Friday.

## 2016-04-14 NOTE — Telephone Encounter (Signed)
Please advise, patient is wanting to get lab work done before appointment next week

## 2016-04-21 ENCOUNTER — Ambulatory Visit (INDEPENDENT_AMBULATORY_CARE_PROVIDER_SITE_OTHER): Payer: Managed Care, Other (non HMO) | Admitting: Internal Medicine

## 2016-04-21 ENCOUNTER — Other Ambulatory Visit (INDEPENDENT_AMBULATORY_CARE_PROVIDER_SITE_OTHER): Payer: Managed Care, Other (non HMO)

## 2016-04-21 ENCOUNTER — Encounter: Payer: Self-pay | Admitting: Internal Medicine

## 2016-04-21 VITALS — BP 120/80 | HR 70 | Temp 98.2°F | Ht 73.0 in | Wt 282.0 lb

## 2016-04-21 DIAGNOSIS — E119 Type 2 diabetes mellitus without complications: Secondary | ICD-10-CM | POA: Diagnosis not present

## 2016-04-21 DIAGNOSIS — L989 Disorder of the skin and subcutaneous tissue, unspecified: Secondary | ICD-10-CM

## 2016-04-21 DIAGNOSIS — Z1159 Encounter for screening for other viral diseases: Secondary | ICD-10-CM

## 2016-04-21 DIAGNOSIS — F329 Major depressive disorder, single episode, unspecified: Secondary | ICD-10-CM

## 2016-04-21 DIAGNOSIS — R6889 Other general symptoms and signs: Secondary | ICD-10-CM | POA: Diagnosis not present

## 2016-04-21 DIAGNOSIS — F32A Depression, unspecified: Secondary | ICD-10-CM

## 2016-04-21 DIAGNOSIS — Z0001 Encounter for general adult medical examination with abnormal findings: Secondary | ICD-10-CM

## 2016-04-21 DIAGNOSIS — E7800 Pure hypercholesterolemia, unspecified: Secondary | ICD-10-CM

## 2016-04-21 DIAGNOSIS — I10 Essential (primary) hypertension: Secondary | ICD-10-CM | POA: Diagnosis not present

## 2016-04-21 DIAGNOSIS — M7989 Other specified soft tissue disorders: Secondary | ICD-10-CM | POA: Diagnosis not present

## 2016-04-21 LAB — CBC WITH DIFFERENTIAL/PLATELET
Basophils Absolute: 0.1 10*3/uL (ref 0.0–0.1)
Basophils Relative: 0.7 % (ref 0.0–3.0)
EOS PCT: 2.3 % (ref 0.0–5.0)
Eosinophils Absolute: 0.2 10*3/uL (ref 0.0–0.7)
HEMATOCRIT: 40 % (ref 39.0–52.0)
Hemoglobin: 13.7 g/dL (ref 13.0–17.0)
LYMPHS PCT: 17.8 % (ref 12.0–46.0)
Lymphs Abs: 1.3 10*3/uL (ref 0.7–4.0)
MCHC: 34.3 g/dL (ref 30.0–36.0)
MCV: 94.8 fl (ref 78.0–100.0)
MONOS PCT: 8.7 % (ref 3.0–12.0)
Monocytes Absolute: 0.7 10*3/uL (ref 0.1–1.0)
Neutro Abs: 5.3 10*3/uL (ref 1.4–7.7)
Neutrophils Relative %: 70.5 % (ref 43.0–77.0)
Platelets: 220 10*3/uL (ref 150.0–400.0)
RBC: 4.22 Mil/uL (ref 4.22–5.81)
RDW: 14.4 % (ref 11.5–15.5)
WBC: 7.5 10*3/uL (ref 4.0–10.5)

## 2016-04-21 LAB — PSA: PSA: 3.12 ng/mL (ref 0.10–4.00)

## 2016-04-21 LAB — MICROALBUMIN / CREATININE URINE RATIO
Creatinine,U: 132.4 mg/dL
Microalb Creat Ratio: 1.1 mg/g (ref 0.0–30.0)
Microalb, Ur: 1.5 mg/dL (ref 0.0–1.9)

## 2016-04-21 LAB — HEPATIC FUNCTION PANEL
ALBUMIN: 4.3 g/dL (ref 3.5–5.2)
ALK PHOS: 92 U/L (ref 39–117)
ALT: 19 U/L (ref 0–53)
AST: 17 U/L (ref 0–37)
Bilirubin, Direct: 0.2 mg/dL (ref 0.0–0.3)
Total Bilirubin: 0.7 mg/dL (ref 0.2–1.2)
Total Protein: 7 g/dL (ref 6.0–8.3)

## 2016-04-21 LAB — URINALYSIS, ROUTINE W REFLEX MICROSCOPIC
Bilirubin Urine: NEGATIVE
HGB URINE DIPSTICK: NEGATIVE
KETONES UR: NEGATIVE
LEUKOCYTES UA: NEGATIVE
NITRITE: NEGATIVE
RBC / HPF: NONE SEEN (ref 0–?)
Specific Gravity, Urine: 1.025 (ref 1.000–1.030)
Total Protein, Urine: NEGATIVE
UROBILINOGEN UA: 0.2 (ref 0.0–1.0)
Urine Glucose: NEGATIVE
WBC, UA: NONE SEEN (ref 0–?)
pH: 5.5 (ref 5.0–8.0)

## 2016-04-21 LAB — LIPID PANEL
CHOLESTEROL: 160 mg/dL (ref 0–200)
HDL: 56.7 mg/dL (ref >39.00)
LDL CALC: 85 mg/dL (ref 0–99)
NonHDL: 103.37
Total CHOL/HDL Ratio: 3
Triglycerides: 90 mg/dL (ref 0.0–149.0)
VLDL: 18 mg/dL (ref 0.0–40.0)

## 2016-04-21 LAB — HEMOGLOBIN A1C: Hgb A1c MFr Bld: 6.2 % (ref 4.6–6.5)

## 2016-04-21 LAB — BASIC METABOLIC PANEL
BUN: 16 mg/dL (ref 6–23)
CALCIUM: 9.5 mg/dL (ref 8.4–10.5)
CO2: 30 mEq/L (ref 19–32)
CREATININE: 0.84 mg/dL (ref 0.40–1.50)
Chloride: 107 mEq/L (ref 96–112)
GFR: 98.45 mL/min (ref >60.00)
GLUCOSE: 102 mg/dL — AB (ref 70–99)
Potassium: 4.5 mEq/L (ref 3.5–5.1)
SODIUM: 144 meq/L (ref 135–145)

## 2016-04-21 LAB — TSH: TSH: 0.41 u[IU]/mL (ref 0.35–4.50)

## 2016-04-21 MED ORDER — LOSARTAN POTASSIUM 50 MG PO TABS: 50.0000 mg | ORAL_TABLET | Freq: Every day | ORAL | Status: DC

## 2016-04-21 MED ORDER — NABUMETONE 500 MG PO TABS: ORAL_TABLET | ORAL | Status: DC

## 2016-04-21 MED ORDER — ATORVASTATIN CALCIUM 20 MG PO TABS: 20.0000 mg | ORAL_TABLET | Freq: Every day | ORAL | Status: DC

## 2016-04-21 NOTE — Progress Notes (Signed)
Pre visit review using our clinic review tool, if applicable. No additional management support is needed unless otherwise documented below in the visit note. 

## 2016-04-21 NOTE — Progress Notes (Signed)
Subjective:    Patient ID: Henry Ellis, male    DOB: [birth year], 60 y.o.   MRN: [id number]  HPI  Here for wellness and f/u;  Overall doing ok;  Pt denies Chest pain, worsening SOB, DOE, wheezing, orthopnea, PND, worsening LE edema, palpitations, dizziness or syncope.  Pt denies neurological change such as new headache, facial or extremity weakness.. Pt states overall good compliance with treatment and medications, good tolerability, and has been trying to follow appropriate diet.  Pt denies worsening depressive symptoms, suicidal ideation or panic. No fever, night sweats, wt loss, loss of appetite, or other constitutional symptoms.  Pt states good ability with ADL's, has low fall risk, home safety reviewed and adequate, no other significant changes in hearing or vision, and only occasionally active with exercise. Does have skin lesions to right chin and right flank area, enlarging in past few months. Also c/o mild swelling right 4th finger PIP swelling without fever, trauma or hx of gout.   Pt denies polydipsia, polyuria, or low sugar symptoms such as weakness or confusion improved with po intake.  Pt states overall good compliance with meds, trying to follow lower cholesterol, diabetic diet, wt overall stable but little exercise however.   Denies worsening depressive symptoms, suicidal ideation, or panic Past Medical History  Diagnosis Date  . History of Bell's palsy   . Esophageal reflux   . ED (erectile dysfunction)   . Chronic bronchitis   . Barrett's esophagus   . Ulcerative esophagitis   . IBS (irritable bowel syndrome)   . Personal history of colonic polyps 04/15/2008 & 04/22/2012    TUBULAR ADENOMA  . Anxiety   . Type II or unspecified type diabetes mellitus without mention of complication, not stated as uncontrolled   . Carpal tunnel syndrome, right 04/21/2011  . Hiatal hernia   . Esophageal stricture   . H/O adenomatous polyp of colon 06/05/2012  . Essential hypertension 10/19/2015    Past Surgical History  Procedure Laterality Date  . Inguinal hernia repair      bilateral  . Knee arthroscopy      right [name]  . Carpal tunnel release      left    reports that he has been smoking Cigarettes.  He has been smoking about 1.00 pack per day. He has never used smokeless tobacco. He reports that he drinks alcohol. He reports that he uses illicit drugs (Marijuana) about twice per week. family history includes Heart disease in his father; Hypertension in his brother; Lung cancer in his father. There is no history of Colon cancer. Allergies  Allergen Reactions  . Bee Venom Anaphylaxis  . Aspirin Nausea Only  . Amoxicillin Rash   Current Outpatient Prescriptions on File Prior to Visit  Medication Sig Dispense Refill  . Ascorbic Acid (VITAMIN C PO) Take 1 tablet by mouth daily.      . Cholecalciferol (VITAMIN D3) 1000 UNITS CAPS Take 1 tablet by mouth daily.      . Ferrous Sulfate (FE-CAPS) 250 MG CPCR Take 1 capsule by mouth daily.      . Glucosamine HCl 1500 MG TABS Take 2 tablets by mouth daily.      . Multiple Vitamin (MULTIVITAMINS PO) Take 1 tablet by mouth daily.      Marland Kitchen omeprazole (PRILOSEC OTC) 20 MG tablet Take 20 mg by mouth daily.    . Psyllium (METAMUCIL) 30.9 % POWD Take by mouth daily.       No current facility-administered  medications on file prior to visit.   Review of Systems Constitutional: Negative for increased diaphoresis, or other activity, appetite or siginficant weight change other than noted HENT: Negative for worsening hearing loss, ear pain, facial swelling, mouth sores and neck stiffness.   Eyes: Negative for other worsening pain, redness or visual disturbance.  Respiratory: Negative for choking or stridor Cardiovascular: Negative for other chest pain and palpitations.  Gastrointestinal: Negative for worsening diarrhea, blood in stool, or abdominal distention Genitourinary: Negative for hematuria, flank pain or change in urine volume.   Musculoskeletal: Negative for myalgias or other joint complaints.  Skin: Negative for other color change and wound or drainage.  Neurological: Negative for syncope and numbness. other than noted Hematological: Negative for adenopathy. or other swelling Psychiatric/Behavioral: Negative for hallucinations, SI, self-injury, decreased concentration or other worsening agitation.      Objective:   Physical Exam BP 120/80 mmHg  Pulse 70  Temp(Src) 98.2 F (36.8 C) (Oral)  Ht 6\' 1"  (1.854 m)  Wt 282 lb (127.914 kg)  BMI 37.21 kg/m2  SpO2 95% VS noted,  Constitutional: Pt is oriented to person, place, and time. Appears well-developed and well-nourished, in no significant distress Head: Normocephalic and atraumatic  Eyes: Conjunctivae and EOM are normal. Pupils are equal, round, and reactive to light Right Ear: External ear normal.  Left Ear: External ear normal Nose: Nose normal.  Mouth/Throat: Oropharynx is clear and moist  Neck: Normal range of motion. Neck supple. No JVD present. No tracheal deviation present or significant neck LA or mass Cardiovascular: Normal rate, regular rhythm, normal heart sounds and intact distal pulses. Pulmonary/Chest: Effort normal and breath sounds without rales or wheezing  Abdominal: Soft. Bowel sounds are normal. NT. No HSM  Musculoskeletal: Normal range of motion. Exhibits no edema Lymphadenopathy: Has no cervical adenopathy.  Neurological: Pt is alert and oriented to person, place, and time. Pt has normal reflexes. No cranial nerve deficit. Motor grossly intact Skin: Skin is warm and dry. No rash noted or new ulcers, right chin with horn like lesion; right flank with 10 mm slight raised dark lesion Psychiatric:  Has normal mood and affect. Behavior is normal.  rigth 4th finger DIP with diffuse 1+ swelling and bony change, mild decreased ROM, NT    Assessment & Plan:

## 2016-04-21 NOTE — Patient Instructions (Signed)
Please continue all other medications as before, and refills have been done if requested.  Please have the pharmacy call with any other refills you may need.  Please continue your efforts at being more active, low cholesterol diet, and weight control.  You are otherwise up to date with prevention measures today.  Please keep your appointments with your specialists as you may have planned  Please make an appt with Dr Tamala Julian regarding the right 4th finger at the check-out desk as you leave  You will be contacted regarding the referral for: Dermatology  Please go to the LAB in the Basement (turn left off the elevator) for the tests to be done today  You will be contacted by phone if any changes need to be made immediately.  Otherwise, you will receive a letter about your results with an explanation, but please check with MyChart first.  Please remember to sign up for MyChart if you have not done so, as this will be important to you in the future with finding out test results, communicating by private email, and scheduling acute appointments online when needed.  Please return in 6 months, or sooner if needed, with Lab testing done 3-5 days before

## 2016-04-22 LAB — HEPATITIS C ANTIBODY: HCV AB: NEGATIVE

## 2016-04-22 NOTE — Assessment & Plan Note (Signed)

## 2016-04-22 NOTE — Assessment & Plan Note (Signed)
stable overall by history and exam, recent data reviewed with pt, and pt to continue medical treatment as before,  to f/u any worsening symptoms or concerns Lab Results  Component Value Date   HGBA1C 6.2 04/21/2016

## 2016-04-22 NOTE — Assessment & Plan Note (Signed)
stable overall by history and exam, recent data reviewed with pt, and pt to continue medical treatment as before,  to f/u any worsening symptoms or concerns Lab Results  Component Value Date   LDLCALC 85 04/21/2016

## 2016-04-22 NOTE — Assessment & Plan Note (Signed)
stable overall by history and exam, recent data reviewed with pt, and pt to continue medical treatment as before,  to f/u any worsening symptoms or concerns BP Readings from Last 3 Encounters:  04/21/16 120/80  10/19/15 152/88  04/16/15 128/80

## 2016-04-22 NOTE — Assessment & Plan Note (Addendum)
Enlarging right chin and right flank, cant r/o malignancy, for derm referral  In addition to the time spent performing CPE, I spent an additional 40 minutes face to face,in which greater than 50% of this time was spent in counseling and coordination of care for patient's acute illness as documented.

## 2016-05-03 ENCOUNTER — Encounter: Payer: Self-pay | Admitting: Family Medicine

## 2016-05-03 ENCOUNTER — Ambulatory Visit (INDEPENDENT_AMBULATORY_CARE_PROVIDER_SITE_OTHER): Payer: Managed Care, Other (non HMO) | Admitting: Family Medicine

## 2016-05-03 VITALS — BP 130/86 | HR 70 | Ht 73.0 in | Wt 282.0 lb

## 2016-05-03 DIAGNOSIS — M65341 Trigger finger, right ring finger: Secondary | ICD-10-CM

## 2016-05-03 MED ORDER — DICLOFENAC SODIUM 2 % TD SOLN: 2.0000 "application " | Freq: Two times a day (BID) | TRANSDERMAL | Status: DC

## 2016-05-03 NOTE — Progress Notes (Signed)
[facility] [address], [place] [address] Phone: [phone] Subjective:    I'm seeing this patient by the request  of:  Cathlean Cower, MD   CC: right hand pain  [id number] Henry Ellis is a 60 y.o. male coming in with complaint of right hand pain. Patient states his ulcer on the fourth finger. States that he has lost some range of motion and over the course last several months. 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 Patient states that he has noticed that sometimes it does do a locking sensation. Never any numbness. Does not remember any true injury. States that he can affect his job. Denies any nighttime awakening. Denies any weakness. Denies any constant numbness. Does have carpal tunnel syndrome on the side but he states that this seems to be different. Unable to take anti-inflammatories secondary to esophagitis has not tried any other home modalities.     Past Medical History  Diagnosis Date  . History of Bell's palsy   . Esophageal reflux   . ED (erectile dysfunction)   . Chronic bronchitis   . Barrett's esophagus   . Ulcerative esophagitis   . IBS (irritable bowel syndrome)   . Personal history of colonic polyps 04/15/2008 & 04/22/2012    TUBULAR ADENOMA  . Anxiety   . Type II or unspecified type diabetes mellitus without mention of complication, not stated as uncontrolled   . Carpal tunnel syndrome, right 04/21/2011  . Hiatal hernia   . Esophageal stricture   . H/O adenomatous polyp of colon 06/05/2012  . Essential hypertension 10/19/2015   Past Surgical History  Procedure Laterality Date  . Inguinal hernia repair      bilateral  . Knee arthroscopy      right [name]  . Carpal tunnel release      left   Social History   Social History  . Marital Status: Single    Spouse Name: N/A  . Number of Children: N/A  . Years of Education: N/A   Occupational History  . Dealer    Social History Main Topics  .  Smoking status: Current Every Day Smoker -- 1.00 packs/day    Types: Cigarettes  . Smokeless tobacco: Never Used     Comment: Counseling sheet given 03-2012   . Alcohol Use: 0.0 oz/week     Comment: 6 pack daily  . Drug Use: 2.00 per week    Special: Marijuana     Comment: MJ  . Sexual Activity: Not Asked   Other Topics Concern  . None   Social History Narrative   Allergies  Allergen Reactions  . Bee Venom Anaphylaxis  . Aspirin Nausea Only  . Amoxicillin Rash   Family History  Problem Relation Age of Onset  . Hypertension Brother     THROUGHOUT FAMILY  . Heart disease Father   . Lung cancer Father   . Colon cancer Neg Hx     Past medical history, social, surgical and family history all reviewed in electronic medical record.  No pertanent information unless stated regarding to the chief complaint.   Review of Systems: No headache, visual changes, nausea, vomiting, diarrhea, constipation, dizziness, abdominal pain, skin rash, fevers, chills, night sweats, weight loss, swollen lymph nodes, body aches, joint swelling, muscle aches, chest pain, shortness of breath, mood changes.   Objective Blood pressure 130/86, pulse 70, height 6\' 1"  (1.854 m), weight 282 lb (127.914 kg), SpO2 97 %.  General: No apparent distress alert and oriented x3 mood and affect normal, dressed appropriately.  HEENT: Pupils equal, extraocular movements intact  Respiratory: Patient's speak in full sentences and does not appear short of breath  Cardiovascular: No lower extremity edema, non tender, no erythema  Skin: Warm dry intact with no signs of infection or rash on extremities or on axial skeleton.  Abdomen: Soft nontender  Neuro: Cranial nerves II through XII are intact, neurovascularly intact in all extremities with 2+ DTRs and 2+ pulses.  Lymph: No lymphadenopathy of posterior or anterior cervical chain or axillae bilaterally.  Gait normal with good balance and coordination.  MSK:  Non tender with  full range of motion and good stability and symmetric strength and tone of shoulders, elbows, wrist, hip, knee and ankles bilaterally.  Right-hand exam shows the patient does have a trigger finger of the fourth finger over the A2 pulley. Tender to palpation in this area. Neurovascular intact distally. 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 Neurovascularly intact. Mild slow capillary refill with some mild clubbing of the fingers. Patient is a chronic smoker.  Procedure: Real-time Ultrasound Guided Injection of right fourth flexor tendon sheath Device: GE Logiq E  Ultrasound guided injection is preferred based studies that show increased duration, increased effect, greater accuracy, decreased procedural pain, increased response rate, and decreased cost with ultrasound guided versus blind injection.  Verbal informed consent obtained.  Time-out conducted.  Noted no overlying erythema, induration, or other signs of local infection.  Skin prepped in a sterile fashion.  Local anesthesia: Topical Ethyl chloride.  With sterile technique and under real time ultrasound guidance:  With a 25-gauge half-inch needle patient was injected with a total of 0.5 mL of 0.5% Marcaine and 0.5 mL of Kenalog 40 mg/dL. Completed without difficulty  Pain immediately resolved suggesting accurate placement of the medication.  Advised to call if fevers/chills, erythema, induration, drainage, or persistent bleeding.  Images permanently stored and available for review in the ultrasound unit.  Impression: Technically successful ultrasound guided injection.   Impression and Recommendations:     This case required medical decision making of moderate complexity.      Note: This dictation was prepared with Dragon dictation along with smaller phrase technology. Any transcriptional errors that result from this process are unintentional.

## 2016-05-03 NOTE — Patient Instructions (Signed)
Nice to meet you  Ice 20 minutes 2 times daily. Usually after activity and before bed. pennsaid pinkie amount topically 2 times daily as needed.  Keep wearing the brace at night Vitamin D 2000 IU daily can help Injected the trigger finger and if it helps we can repeat up to every 3 months.  Make an appointment in 3 weeks and see me if it is not much better.

## 2016-05-03 NOTE — Assessment & Plan Note (Signed)
Patient is an injection today and tolerated the procedure well. Prescribed him for topical anti-inflammatories with patient being unable to tolerate oral anti-inflammatories. We discussed icing regimen. We discussed avoiding certain activities. Patient and will come back and see me again in 3 weeks to make sure that this is completely resolved. Can have repeat injections every 3-4 months if necessary. Do not feel that further workup is necessary at this time.

## 2016-05-03 NOTE — Progress Notes (Signed)
Pre visit review using our clinic review tool, if applicable. No additional management support is needed unless otherwise documented below in the visit note. 

## 2016-05-26 ENCOUNTER — Ambulatory Visit: Payer: Managed Care, Other (non HMO) | Admitting: Family Medicine

## 2016-06-16 ENCOUNTER — Encounter: Payer: Self-pay | Admitting: Internal Medicine

## 2016-08-16 ENCOUNTER — Other Ambulatory Visit: Payer: Self-pay | Admitting: *Deleted

## 2016-08-16 MED ORDER — ATORVASTATIN CALCIUM 20 MG PO TABS: 20.0000 mg | ORAL_TABLET | Freq: Every day | ORAL | 2 refills | Status: DC

## 2016-08-16 NOTE — Telephone Encounter (Signed)
Pt left msg on triage yesterday afternoon requesting refill on his Atorvastatin. Sent to [facility] this am.../lmb

## 2016-08-18 ENCOUNTER — Ambulatory Visit (INDEPENDENT_AMBULATORY_CARE_PROVIDER_SITE_OTHER)
Admission: RE | Admit: 2016-08-18 | Discharge: 2016-08-18 | Disposition: A | Discharge status: Home / Self Care | Payer: Managed Care, Other (non HMO) | Source: Ambulatory Visit | Attending: Acute Care | Admitting: Acute Care

## 2016-08-18 ENCOUNTER — Telehealth: Payer: Self-pay | Admitting: Acute Care

## 2016-08-18 ENCOUNTER — Telehealth: Payer: Self-pay | Admitting: Internal Medicine

## 2016-08-18 ENCOUNTER — Other Ambulatory Visit: Payer: Self-pay | Admitting: Acute Care

## 2016-08-18 DIAGNOSIS — F17210 Nicotine dependence, cigarettes, uncomplicated: Secondary | ICD-10-CM

## 2016-08-18 DIAGNOSIS — Z87891 Personal history of nicotine dependence: Secondary | ICD-10-CM

## 2016-08-18 DIAGNOSIS — N2889 Other specified disorders of kidney and ureter: Secondary | ICD-10-CM

## 2016-08-18 NOTE — Telephone Encounter (Signed)
-----   Message from Magdalen Spatz, NP sent at 08/18/2016  2:23 PM EDT ----- Regarding: LDCT results Dr. Jenny Reichmann, Mr. Wozny low dose CT was read as a Lung RADS 2 S: nodules that are benign in appearance and behavior with a very low likelihood of becoming a clinically active cancer due to size or lack of growth. Recommendation per radiology is for a repeat LDCT in 12 months.I have called him the results. We will schedule his follow up exam.There was an incidental finding of a renal mass. Suggestion per radiology is for renal US or MRI. Please follow up as you feel is clinically indicated. I told the patient I would communicate this information to you, and that you or your office would be in touch to schedule follow up diagnostics. He verbalized understanding. Please let me know if you have any questions or concerns. Thanks so much.

## 2016-08-18 NOTE — Telephone Encounter (Signed)
Corinne or staff to contact pt - ok for renal u/s, I will order

## 2016-08-18 NOTE — Telephone Encounter (Signed)
IMPRESSION: 1. Lung-RADS Category 2S, benign appearance or behavior. Continue annual screening with low-dose chest CT without contrast in 12 months. 2.  Aortic atherosclerosis. 3. The "S" modifier represents a potentially clinically significant non pulmonary finding. **An incidental finding of potential clinical significance has been found. Soft tissue fullness in the upper pole left kidney. Suspicious for mass. Further evaluation is recommended. Given hypoattenuation, renal ultrasound could be performed to exclude a cyst or complex cyst. More definitive characterization with pre and post contrast abdominal MRI (preferred) or CT could alternatively be considered. These results will be called to the ordering clinician or representative by the Radiologist Assistant, and communication documented in the PACS or zVision Dashboard.**

## 2016-08-18 NOTE — Telephone Encounter (Signed)
I have called these results to the patient. I explained that his scan was read as a Lung RADS 2 S: indicating nodules that are benign in appearance and behavior with a very low likelihood of becoming a clinically active cancer due to size or lack of growth. Recommendation per radiology is for a repeat LDCT in 12 months. We will schedule this exam.The S modifier is for an incidental finding of a renal mass. Radiology suggests follow up renal US or MRI. I have explained this to Henry Ellis. Please see below for a copy of the communication to Dr. Jenny Reichmann sent through the [facility] message system for follow up of this finding.Henry Ellis verbalized understanding of this, and that I would be contacting his PCP to manage additional diagnostic work up. .   Message to Dr. Jenny Reichmann.  Dr. Jenny Reichmann, Henry Ellis low dose CT was read as a Lung RADS 2 S: nodules that are benign in appearance and behavior with a very low likelihood of becoming a clinically active cancer due to size or lack of growth. Recommendation per radiology is for a repeat LDCT in 12 months.I have called him the results. We will schedule his follow up exam.There was an incidental finding of a renal mass. Suggestion per radiology is for renal US or MRI. Please follow up as you feel is clinically indicated. I told the patient I would communicate this information to you, and that you or your office would be in touch to schedule follow up diagnostics. He verbalized understanding. Please let me know if you have any questions or concerns. Thanks so much.

## 2016-08-22 NOTE — Telephone Encounter (Signed)
Called patient unable to reach left message to give us a call back.

## 2016-09-01 ENCOUNTER — Other Ambulatory Visit: Payer: Managed Care, Other (non HMO)

## 2016-09-08 ENCOUNTER — Other Ambulatory Visit: Payer: Self-pay | Admitting: Internal Medicine

## 2016-09-08 ENCOUNTER — Ambulatory Visit
Admission: RE | Admit: 2016-09-08 | Discharge: 2016-09-08 | Disposition: A | Discharge status: Home / Self Care | Payer: Managed Care, Other (non HMO) | Source: Ambulatory Visit | Attending: Internal Medicine | Admitting: Internal Medicine

## 2016-09-08 ENCOUNTER — Telehealth: Payer: Self-pay

## 2016-09-08 ENCOUNTER — Telehealth: Payer: Self-pay | Admitting: Internal Medicine

## 2016-09-08 DIAGNOSIS — N2889 Other specified disorders of kidney and ureter: Secondary | ICD-10-CM

## 2016-09-08 NOTE — Telephone Encounter (Signed)
Not clear MRI actually needed and this is > $2000 test  I have referred asap to Urology, thanks

## 2016-09-08 NOTE — Telephone Encounter (Signed)
Referral has been faxed. I called them and they are going to work on getting him asap

## 2016-09-08 NOTE — Telephone Encounter (Signed)
Radiologist called with results from the renal US.  He states that the US showed renal mass consistent with malignancy. Suspected Renal Cell Carcinoma. Suggest an MRI w wo contrast.

## 2016-09-08 NOTE — Telephone Encounter (Signed)
[facility] to see above please, asap to urology for new left kidney mass

## 2016-09-18 ENCOUNTER — Other Ambulatory Visit: Payer: Self-pay | Admitting: Urology

## 2016-09-19 ENCOUNTER — Other Ambulatory Visit: Payer: Self-pay | Admitting: Urology

## 2016-10-12 NOTE — Patient Instructions (Signed)
MANJOT BROWNRIDGE  10/12/2016   Your procedure is scheduled on: 10/18/16  Report to [facility] Main  Entrance take [place]  elevators to 3rd floor to  [place] at [place] AM.  Call this number if you have problems the morning of surgery [phone] FOLLOW BOWEL PREP INSTRUCTIONS AS PER OFFICE--CLEAR LIQUID DIET INSTRUCTIONS ATTATCHED  Remember: ONLY 1 PERSON MAY GO WITH YOU TO SHORT STAY TO GET  READY MORNING OF YOUR SURGERY.  Do nottake anything by mouth :After Midnight.     Take these medicines the morning of surgery with A SIP OF WATER: Omeprazole DO NOT TAKE ANY DIABETIC MEDICATIONS DAY OF YOUR SURGERY                               You may not have any metal on your body including hair pins and              piercings  Do not wear jewelry, make-up, lotions, powders or perfumes, deodorant             Do not wear nail polish.  Do not shave  48 hours prior to surgery.              Men may shave face and neck.   Do not bring valuables to the hospital. [place].  Contacts, dentures or bridgework may not be worn into surgery.  Leave suitcase in the car. After surgery it may be brought to your room.                  Please read over the following fact sheets you were given: _____________________________________________________________________             [facility] - Preparing for Surgery Before surgery, you can play an important role.  Because skin is not sterile, your skin needs to be as free of germs as possible.  You can reduce the number of germs on your skin by washing with CHG (chlorahexidine gluconate) soap before surgery.  CHG is an antiseptic cleaner which kills germs and bonds with the skin to continue killing germs even after washing. Please DO NOT use if you have an allergy to CHG or antibacterial soaps.  If your skin becomes reddened/irritated stop using the CHG and inform your nurse when you  arrive at Short Stay. Do not shave (including legs and underarms) for at least 48 hours prior to the first CHG shower.  You may shave your face/neck. Please follow these instructions carefully:  1.  Shower with CHG Soap the night before surgery and the  morning of Surgery.  2.  If you choose to wash your hair, wash your hair first as usual with your  normal  shampoo.  3.  After you shampoo, rinse your hair and body thoroughly to remove the  shampoo.                           4.  Use CHG as you would any other liquid soap.  You can apply chg directly  to the skin and wash  Gently with a scrungie or clean washcloth.  5.  Apply the CHG Soap to your body ONLY FROM THE NECK DOWN.   Do not use on face/ open                           Wound or open sores. Avoid contact with eyes, ears mouth and genitals (private parts).                       Wash face,  Genitals (private parts) with your normal soap.             6.  Wash thoroughly, paying special attention to the area where your surgery  will be performed.  7.  Thoroughly rinse your body with warm water from the neck down.  8.  DO NOT shower/wash with your normal soap after using and rinsing off  the CHG Soap.                9.  Pat yourself dry with a clean towel.            10.  Wear clean pajamas.            11.  Place clean sheets on your bed the night of your first shower and do not  sleep with pets. Day of Surgery : Do not apply any lotions/deodorants the morning of surgery.  Please wear clean clothes to the hospital/surgery center.  FAILURE TO FOLLOW THESE INSTRUCTIONS MAY RESULT IN THE CANCELLATION OF YOUR SURGERY PATIENT SIGNATURE_________________________________  NURSE SIGNATURE__________________________________  ________________________________________________________________________    CLEAR LIQUID DIET   Foods Allowed                                                                     Foods Excluded  Coffee  and tea, regular and decaf                             liquids that you cannot  Plain Jell-O in any flavor                                             see through such as: Fruit ices (not with fruit pulp)                                     milk, soups, orange juice  Iced Popsicles                                    All solid food Carbonated beverages, regular and diet                                    Cranberry, grape and apple juices Sports drinks like Gatorade Lightly seasoned clear broth or consume(fat free) Sugar, honey syrup  Sample Menu Breakfast                                Lunch                                     Supper Cranberry juice                    Beef broth                            Chicken broth Jell-O                                     Grape juice                           Apple juice Coffee or tea                        Jell-O                                      Popsicle                                                Coffee or tea                        Coffee or tea  _____________________________________________________________________

## 2016-10-16 ENCOUNTER — Encounter (HOSPITAL_COMMUNITY)
Admission: RE | Admit: 2016-10-16 | Discharge: 2016-10-16 | Disposition: A | Discharge status: Home / Self Care | Payer: Managed Care, Other (non HMO) | Source: Ambulatory Visit | Attending: Urology | Admitting: Urology

## 2016-10-16 ENCOUNTER — Encounter (HOSPITAL_COMMUNITY): Payer: Self-pay

## 2016-10-16 HISTORY — DX: Anemia, unspecified: D64.9

## 2016-10-16 HISTORY — DX: Pure hypercholesterolemia, unspecified: E78.00

## 2016-10-16 HISTORY — DX: Unspecified osteoarthritis, unspecified site: M19.90

## 2016-10-16 LAB — CBC
HCT: 40.1 % (ref 39.0–52.0)
Hemoglobin: 13.6 g/dL (ref 13.0–17.0)
MCH: 32.6 pg (ref 26.0–34.0)
MCHC: 33.9 g/dL (ref 30.0–36.0)
MCV: 96.2 fL (ref 78.0–100.0)
PLATELETS: 191 10*3/uL (ref 150–400)
RBC: 4.17 MIL/uL — ABNORMAL LOW (ref 4.22–5.81)
RDW: 13.7 % (ref 11.5–15.5)
WBC: 7.4 10*3/uL (ref 4.0–10.5)

## 2016-10-16 LAB — ABO/RH: ABO/RH(D): A POS

## 2016-10-16 LAB — BASIC METABOLIC PANEL
Anion gap: 8 (ref 5–15)
BUN: 18 mg/dL (ref 6–20)
CALCIUM: 9.3 mg/dL (ref 8.9–10.3)
CHLORIDE: 105 mmol/L (ref 101–111)
CO2: 30 mmol/L (ref 22–32)
CREATININE: 0.77 mg/dL (ref 0.61–1.24)
GFR calc Af Amer: >60 mL/min (ref >60)
GFR calc non Af Amer: >60 mL/min (ref >60)
Glucose, Bld: 92 mg/dL (ref 65–99)
Potassium: 4.3 mmol/L (ref 3.5–5.1)
SODIUM: 143 mmol/L (ref 135–145)

## 2016-10-16 NOTE — Progress Notes (Signed)
   10/16/16 1117  OBSTRUCTIVE SLEEP APNEA  Have you ever been diagnosed with sleep apnea through a sleep study? No  Do you snore loudly (loud enough to be heard through closed doors)?  0  Do you often feel tired, fatigued, or sleepy during the daytime (such as falling asleep during driving or talking to someone)? 0  Has anyone observed you stop breathing during your sleep? 0  Do you have, or are you being treated for high blood pressure? 1  BMI more than 35 kg/m2? 1  Age > 50 (1-yes) 1  Neck circumference greater than:Male 16 inches or larger, Male 17inches or larger? 1  Male Gender (Yes=1) 1  Obstructive Sleep Apnea Score 5  Score 5 or greater  Results sent to PCP

## 2016-10-16 NOTE — Progress Notes (Signed)
States has bowel prep instructions

## 2016-10-17 DIAGNOSIS — C801 Malignant (primary) neoplasm, unspecified: Secondary | ICD-10-CM

## 2016-10-17 HISTORY — DX: Malignant (primary) neoplasm, unspecified: C80.1

## 2016-10-17 LAB — HEMOGLOBIN A1C
HEMOGLOBIN A1C: 5.7 % — AB (ref 4.8–5.6)
MEAN PLASMA GLUCOSE: 117 mg/dL

## 2016-10-18 ENCOUNTER — Encounter (HOSPITAL_COMMUNITY)
Admission: RE | Disposition: A | Discharge status: Home / Self Care | Payer: Self-pay | Source: Ambulatory Visit | Attending: Urology

## 2016-10-18 ENCOUNTER — Inpatient Hospital Stay (HOSPITAL_COMMUNITY)
Admission: RE | Admit: 2016-10-18 | Discharge: 2016-10-19 | DRG: 658 | Disposition: A | Discharge status: Home / Self Care | Payer: Managed Care, Other (non HMO) | Source: Ambulatory Visit | Attending: Urology | Admitting: Urology

## 2016-10-18 ENCOUNTER — Encounter (HOSPITAL_COMMUNITY): Payer: Self-pay

## 2016-10-18 ENCOUNTER — Inpatient Hospital Stay (HOSPITAL_COMMUNITY): Payer: Managed Care, Other (non HMO) | Admitting: Certified Registered Nurse Anesthetist

## 2016-10-18 DIAGNOSIS — I10 Essential (primary) hypertension: Secondary | ICD-10-CM | POA: Diagnosis present

## 2016-10-18 DIAGNOSIS — Z86010 Personal history of colonic polyps: Secondary | ICD-10-CM

## 2016-10-18 DIAGNOSIS — F17210 Nicotine dependence, cigarettes, uncomplicated: Secondary | ICD-10-CM | POA: Diagnosis present

## 2016-10-18 DIAGNOSIS — Z9889 Other specified postprocedural states: Secondary | ICD-10-CM | POA: Diagnosis not present

## 2016-10-18 DIAGNOSIS — Z79899 Other long term (current) drug therapy: Secondary | ICD-10-CM

## 2016-10-18 DIAGNOSIS — Z801 Family history of malignant neoplasm of trachea, bronchus and lung: Secondary | ICD-10-CM

## 2016-10-18 DIAGNOSIS — Z880 Allergy status to penicillin: Secondary | ICD-10-CM | POA: Diagnosis not present

## 2016-10-18 DIAGNOSIS — C642 Malignant neoplasm of left kidney, except renal pelvis: Secondary | ICD-10-CM | POA: Diagnosis present

## 2016-10-18 DIAGNOSIS — Z8249 Family history of ischemic heart disease and other diseases of the circulatory system: Secondary | ICD-10-CM | POA: Diagnosis not present

## 2016-10-18 DIAGNOSIS — N2889 Other specified disorders of kidney and ureter: Secondary | ICD-10-CM | POA: Diagnosis present

## 2016-10-18 DIAGNOSIS — E119 Type 2 diabetes mellitus without complications: Secondary | ICD-10-CM | POA: Diagnosis present

## 2016-10-18 HISTORY — PX: ROBOT ASSISTED LAPAROSCOPIC NEPHRECTOMY: SHX5140

## 2016-10-18 LAB — GLUCOSE, CAPILLARY
GLUCOSE-CAPILLARY: 138 mg/dL — AB (ref 65–99)
Glucose-Capillary: 139 mg/dL — ABNORMAL HIGH (ref 65–99)

## 2016-10-18 LAB — HEMOGLOBIN AND HEMATOCRIT, BLOOD
HCT: 41 % (ref 39.0–52.0)
HEMOGLOBIN: 13.3 g/dL (ref 13.0–17.0)

## 2016-10-18 LAB — TYPE AND SCREEN
ABO/RH(D): A POS
Antibody Screen: NEGATIVE

## 2016-10-18 SURGERY — NEPHRECTOMY, RADICAL, ROBOT-ASSISTED, LAPAROSCOPIC, ADULT
Anesthesia: General | Laterality: Left

## 2016-10-18 MED ORDER — OMEPRAZOLE MAGNESIUM 20 MG PO TBEC: 20.0000 mg | DELAYED_RELEASE_TABLET | Freq: Every day | ORAL | Status: DC

## 2016-10-18 MED ORDER — HYDROMORPHONE HCL 1 MG/ML IJ SOLN
0.2500 mg | INTRAMUSCULAR | Status: DC | PRN
  Administered 2016-10-18 (×4): 0.5 mg via INTRAVENOUS

## 2016-10-18 MED ORDER — MAGNESIUM CITRATE PO SOLN: 1.0000 | Freq: Once | ORAL | Status: DC

## 2016-10-18 MED ORDER — DEXTROSE-NACL 5-0.45 % IV SOLN
INTRAVENOUS | Status: DC
  Administered 2016-10-18 – 2016-10-19 (×3): via INTRAVENOUS

## 2016-10-18 MED ORDER — BUPIVACAINE LIPOSOME 1.3 % IJ SUSP
INTRAMUSCULAR | Status: AC
  Filled 2016-10-18: qty 20

## 2016-10-18 MED ORDER — ONDANSETRON HCL 4 MG/2ML IJ SOLN: 4.0000 mg | INTRAMUSCULAR | Status: DC | PRN

## 2016-10-18 MED ORDER — ALBUTEROL SULFATE HFA 108 (90 BASE) MCG/ACT IN AERS
INHALATION_SPRAY | RESPIRATORY_TRACT | Status: DC | PRN
  Administered 2016-10-18 (×3): 2 via RESPIRATORY_TRACT

## 2016-10-18 MED ORDER — ONDANSETRON HCL 4 MG/2ML IJ SOLN
INTRAMUSCULAR | Status: AC
  Filled 2016-10-18: qty 2

## 2016-10-18 MED ORDER — LIDOCAINE HCL (CARDIAC) 20 MG/ML IV SOLN
INTRAVENOUS | Status: DC | PRN
  Administered 2016-10-18 (×2): 100 mg via INTRAVENOUS

## 2016-10-18 MED ORDER — DOCUSATE SODIUM 100 MG PO CAPS
100.0000 mg | ORAL_CAPSULE | Freq: Every day | ORAL | Status: DC
  Administered 2016-10-18 – 2016-10-19 (×2): 100 mg via ORAL
  Filled 2016-10-18 (×2): qty 1

## 2016-10-18 MED ORDER — KETAMINE HCL 10 MG/ML IJ SOLN
INTRAMUSCULAR | Status: AC
  Filled 2016-10-18: qty 1

## 2016-10-18 MED ORDER — SUCCINYLCHOLINE CHLORIDE 20 MG/ML IJ SOLN
INTRAMUSCULAR | Status: DC | PRN
  Administered 2016-10-18: 140 mg via INTRAVENOUS

## 2016-10-18 MED ORDER — HYDROCODONE-ACETAMINOPHEN 5-325 MG PO TABS: 1.0000 | ORAL_TABLET | Freq: Four times a day (QID) | ORAL | 0 refills | Status: DC | PRN

## 2016-10-18 MED ORDER — LACTATED RINGERS IV SOLN
INTRAVENOUS | Status: DC
  Administered 2016-10-18: 08:00:00 via INTRAVENOUS

## 2016-10-18 MED ORDER — PROPOFOL 10 MG/ML IV BOLUS
INTRAVENOUS | Status: AC
  Filled 2016-10-18: qty 20

## 2016-10-18 MED ORDER — SUCCINYLCHOLINE CHLORIDE 20 MG/ML IJ SOLN
INTRAMUSCULAR | Status: AC
  Filled 2016-10-18: qty 1

## 2016-10-18 MED ORDER — ALBUTEROL SULFATE HFA 108 (90 BASE) MCG/ACT IN AERS
INHALATION_SPRAY | RESPIRATORY_TRACT | Status: AC
  Filled 2016-10-18: qty 6.7

## 2016-10-18 MED ORDER — DIPHENHYDRAMINE HCL 50 MG/ML IJ SOLN: 12.5000 mg | Freq: Four times a day (QID) | INTRAMUSCULAR | Status: DC | PRN

## 2016-10-18 MED ORDER — MIDAZOLAM HCL 5 MG/5ML IJ SOLN
INTRAMUSCULAR | Status: DC | PRN
  Administered 2016-10-18: 2 mg via INTRAVENOUS

## 2016-10-18 MED ORDER — HYDROMORPHONE HCL 1 MG/ML IJ SOLN
0.5000 mg | INTRAMUSCULAR | Status: DC | PRN
  Administered 2016-10-18 – 2016-10-19 (×4): 1 mg via INTRAVENOUS
  Filled 2016-10-18 (×4): qty 1

## 2016-10-18 MED ORDER — SUGAMMADEX SODIUM 200 MG/2ML IV SOLN
INTRAVENOUS | Status: DC | PRN
  Administered 2016-10-18: 300 mg via INTRAVENOUS

## 2016-10-18 MED ORDER — ROCURONIUM BROMIDE 100 MG/10ML IV SOLN
INTRAVENOUS | Status: DC | PRN
  Administered 2016-10-18 (×2): 20 mg via INTRAVENOUS
  Administered 2016-10-18: 50 mg via INTRAVENOUS

## 2016-10-18 MED ORDER — ROCURONIUM BROMIDE 50 MG/5ML IV SOSY
PREFILLED_SYRINGE | INTRAVENOUS | Status: AC
  Filled 2016-10-18: qty 5

## 2016-10-18 MED ORDER — CIPROFLOXACIN IN D5W 400 MG/200ML IV SOLN
INTRAVENOUS | Status: AC
  Filled 2016-10-18: qty 200

## 2016-10-18 MED ORDER — HYDROMORPHONE HCL 1 MG/ML IJ SOLN
INTRAMUSCULAR | Status: AC
  Filled 2016-10-18: qty 1

## 2016-10-18 MED ORDER — DEXAMETHASONE SODIUM PHOSPHATE 10 MG/ML IJ SOLN
INTRAMUSCULAR | Status: AC
  Filled 2016-10-18: qty 1

## 2016-10-18 MED ORDER — PNEUMOCOCCAL VAC POLYVALENT 25 MCG/0.5ML IJ INJ
0.5000 mL | INJECTION | INTRAMUSCULAR | Status: AC
  Administered 2016-10-19: 0.5 mL via INTRAMUSCULAR
  Filled 2016-10-18 (×2): qty 0.5

## 2016-10-18 MED ORDER — KETAMINE HCL 10 MG/ML IJ SOLN
INTRAMUSCULAR | Status: DC | PRN
  Administered 2016-10-18: 30 mg via INTRAVENOUS

## 2016-10-18 MED ORDER — ONDANSETRON HCL 4 MG/2ML IJ SOLN
INTRAMUSCULAR | Status: DC | PRN
  Administered 2016-10-18: 4 mg via INTRAVENOUS

## 2016-10-18 MED ORDER — DEXAMETHASONE SODIUM PHOSPHATE 10 MG/ML IJ SOLN
INTRAMUSCULAR | Status: DC | PRN
  Administered 2016-10-18: 10 mg via INTRAVENOUS

## 2016-10-18 MED ORDER — PROPOFOL 10 MG/ML IV BOLUS
INTRAVENOUS | Status: DC | PRN
  Administered 2016-10-18: 200 mg via INTRAVENOUS

## 2016-10-18 MED ORDER — CLINDAMYCIN PHOSPHATE 900 MG/50ML IV SOLN
INTRAVENOUS | Status: AC
  Filled 2016-10-18: qty 50

## 2016-10-18 MED ORDER — FENTANYL CITRATE (PF) 100 MCG/2ML IJ SOLN
INTRAMUSCULAR | Status: DC | PRN
  Administered 2016-10-18 (×4): 50 ug via INTRAVENOUS

## 2016-10-18 MED ORDER — LACTATED RINGERS IR SOLN
Status: DC | PRN
  Administered 2016-10-18: 1000 mL

## 2016-10-18 MED ORDER — LIDOCAINE 2% (20 MG/ML) 5 ML SYRINGE
INTRAMUSCULAR | Status: AC
  Filled 2016-10-18: qty 5

## 2016-10-18 MED ORDER — FENTANYL CITRATE (PF) 250 MCG/5ML IJ SOLN
INTRAMUSCULAR | Status: AC
  Filled 2016-10-18: qty 5

## 2016-10-18 MED ORDER — SODIUM CHLORIDE 0.9 % IJ SOLN
INTRAMUSCULAR | Status: AC
  Filled 2016-10-18: qty 50

## 2016-10-18 MED ORDER — OXYCODONE HCL 5 MG PO TABS: 5.0000 mg | ORAL_TABLET | ORAL | Status: DC | PRN

## 2016-10-18 MED ORDER — BUPIVACAINE LIPOSOME 1.3 % IJ SUSP
INTRAMUSCULAR | Status: DC | PRN
  Administered 2016-10-18: 20 mL

## 2016-10-18 MED ORDER — EPHEDRINE SULFATE 50 MG/ML IJ SOLN
INTRAMUSCULAR | Status: DC | PRN
  Administered 2016-10-18 (×2): 10 mg via INTRAVENOUS

## 2016-10-18 MED ORDER — SUGAMMADEX SODIUM 500 MG/5ML IV SOLN
INTRAVENOUS | Status: AC
  Filled 2016-10-18: qty 5

## 2016-10-18 MED ORDER — MIDAZOLAM HCL 2 MG/2ML IJ SOLN
INTRAMUSCULAR | Status: AC
  Filled 2016-10-18: qty 2

## 2016-10-18 MED ORDER — PANTOPRAZOLE SODIUM 40 MG PO TBEC
40.0000 mg | DELAYED_RELEASE_TABLET | Freq: Every day | ORAL | Status: DC
  Administered 2016-10-18 – 2016-10-19 (×2): 40 mg via ORAL
  Filled 2016-10-18 (×2): qty 1

## 2016-10-18 MED ORDER — CIPROFLOXACIN IN D5W 400 MG/200ML IV SOLN
400.0000 mg | Freq: Two times a day (BID) | INTRAVENOUS | Status: DC
  Administered 2016-10-18: 400 mg via INTRAVENOUS

## 2016-10-18 MED ORDER — INFLUENZA VAC SPLIT QUAD 0.5 ML IM SUSY
0.5000 mL | PREFILLED_SYRINGE | INTRAMUSCULAR | Status: AC
  Administered 2016-10-19: 0.5 mL via INTRAMUSCULAR
  Filled 2016-10-18: qty 0.5

## 2016-10-18 MED ORDER — CLINDAMYCIN PHOSPHATE 900 MG/50ML IV SOLN
900.0000 mg | Freq: Once | INTRAVENOUS | Status: AC
  Administered 2016-10-18: 900 mg via INTRAVENOUS

## 2016-10-18 MED ORDER — ACETAMINOPHEN 500 MG PO TABS
1000.0000 mg | ORAL_TABLET | Freq: Four times a day (QID) | ORAL | Status: AC
  Administered 2016-10-18 – 2016-10-19 (×3): 1000 mg via ORAL
  Filled 2016-10-18 (×3): qty 2

## 2016-10-18 MED ORDER — HYDROMORPHONE HCL 1 MG/ML IJ SOLN: 0.2500 mg | INTRAMUSCULAR | Status: DC | PRN

## 2016-10-18 MED ORDER — DIPHENHYDRAMINE HCL 12.5 MG/5ML PO ELIX: 12.5000 mg | ORAL_SOLUTION | Freq: Four times a day (QID) | ORAL | Status: DC | PRN

## 2016-10-18 SURGICAL SUPPLY — 58 items
ADH SKN CLS APL DERMABOND .7 (GAUZE/BANDAGES/DRESSINGS) ×2
BAG LAPAROSCOPIC 12 15 PORT 16 (BASKET) ×1 IMPLANT
BAG RETRIEVAL 12/15 (BASKET) ×2
CHLORAPREP W/TINT 26ML (MISCELLANEOUS) ×2 IMPLANT
CLIP LIGATING HEM O LOK PURPLE (MISCELLANEOUS) ×1 IMPLANT
CLIP LIGATING HEMO LOK XL GOLD (MISCELLANEOUS) ×2 IMPLANT
CLIP LIGATING HEMO O LOK GREEN (MISCELLANEOUS) ×1 IMPLANT
COVER TIP SHEARS 8 DVNC (MISCELLANEOUS) ×1 IMPLANT
COVER TIP SHEARS 8MM DA VINCI (MISCELLANEOUS) ×1
DECANTER SPIKE VIAL GLASS SM (MISCELLANEOUS) ×1 IMPLANT
DERMABOND ADVANCED (GAUZE/BANDAGES/DRESSINGS) ×2
DERMABOND ADVANCED .7 DNX12 (GAUZE/BANDAGES/DRESSINGS) ×2 IMPLANT
DRAIN CHANNEL 15F RND FF 3/16 (WOUND CARE) IMPLANT
DRAPE ARM DVNC X/XI (DISPOSABLE) ×4 IMPLANT
DRAPE COLUMN DVNC XI (DISPOSABLE) ×1 IMPLANT
DRAPE DA VINCI XI ARM (DISPOSABLE) ×4
DRAPE DA VINCI XI COLUMN (DISPOSABLE) ×1
DRAPE INCISE IOBAN 66X45 STRL (DRAPES) ×2 IMPLANT
DRAPE LAPAROSCOPIC ABDOMINAL (DRAPES) ×2 IMPLANT
DRAPE SHEET LG 3/4 BI-LAMINATE (DRAPES) ×2 IMPLANT
DRSG TEGADERM 6X8 (GAUZE/BANDAGES/DRESSINGS) ×1 IMPLANT
ELECT PENCIL ROCKER SW 15FT (MISCELLANEOUS) ×2 IMPLANT
ELECT REM PT RETURN 9FT ADLT (ELECTROSURGICAL) ×2
ELECTRODE REM PT RTRN 9FT ADLT (ELECTROSURGICAL) ×2 IMPLANT
EVACUATOR SILICONE 100CC (DRAIN) IMPLANT
GLOVE BIO SURGEON STRL SZ 6.5 (GLOVE) ×2 IMPLANT
GLOVE BIOGEL M STRL SZ7.5 (GLOVE) ×4 IMPLANT
GOWN STRL REUS W/TWL LRG LVL3 (GOWN DISPOSABLE) ×6 IMPLANT
IRRIG SUCT STRYKERFLOW 2 WTIP (MISCELLANEOUS)
IRRIGATION SUCT STRKRFLW 2 WTP (MISCELLANEOUS) IMPLANT
KIT BASIN OR (CUSTOM PROCEDURE TRAY) ×2 IMPLANT
LOOP VESSEL MAXI BLUE (MISCELLANEOUS) ×2 IMPLANT
NDL INSUFFLATION 14GA 120MM (NEEDLE) ×1 IMPLANT
NEEDLE INSUFFLATION 14GA 120MM (NEEDLE) ×2 IMPLANT
PORT ACCESS TROCAR AIRSEAL 12 (TROCAR) ×1 IMPLANT
PORT ACCESS TROCAR AIRSEAL 5M (TROCAR) ×1
RELOAD STAPLE 60 2.6 WHT THN (STAPLE) IMPLANT
RELOAD STAPLER WHITE 60MM (STAPLE) ×4 IMPLANT
SEAL CANN UNIV 5-8 DVNC XI (MISCELLANEOUS) ×4 IMPLANT
SEAL XI 5MM-8MM UNIVERSAL (MISCELLANEOUS) ×4
SET TRI-LUMEN FLTR TB AIRSEAL (TUBING) ×2 IMPLANT
SOLUTION ELECTROLUBE (MISCELLANEOUS) ×2 IMPLANT
SPONGE LAP 4X18 X RAY DECT (DISPOSABLE) ×2 IMPLANT
STAPLE ECHEON FLEX 60 POW ENDO (STAPLE) ×1 IMPLANT
STAPLER RELOAD WHITE 60MM (STAPLE) ×8
SUT ETHILON 3 0 PS 1 (SUTURE) IMPLANT
SUT MNCRL AB 4-0 PS2 18 (SUTURE) ×4 IMPLANT
SUT PDS AB 1 CT1 27 (SUTURE) ×6 IMPLANT
SUT VICRYL 0 UR6 27IN ABS (SUTURE) ×1 IMPLANT
TAPE STRIPS DRAPE STRL (GAUZE/BANDAGES/DRESSINGS) ×1 IMPLANT
TOWEL OR NON WOVEN STRL DISP B (DISPOSABLE) ×4 IMPLANT
TRAY FOLEY W/METER SILVER 16FR (SET/KITS/TRAYS/PACK) ×2 IMPLANT
TRAY LAPAROSCOPIC (CUSTOM PROCEDURE TRAY) ×2 IMPLANT
TROCAR BLADELESS OPT 12M 100M (ENDOMECHANICALS) ×2 IMPLANT
TROCAR BLADELESS OPT 5 100 (ENDOMECHANICALS) IMPLANT
TROCAR UNIVERSAL OPT 12M 100M (ENDOMECHANICALS) ×2 IMPLANT
TROCAR XCEL 12X100 BLDLESS (ENDOMECHANICALS) ×2 IMPLANT
WATER STERILE IRR 1500ML POUR (IV SOLUTION) ×2 IMPLANT

## 2016-10-18 NOTE — Anesthesia Postprocedure Evaluation (Signed)
Anesthesia Post Note  Patient: Henry Ellis  Procedure(s) Performed: Procedure(s) (LRB): XI ROBOTIC ASSISTED LAPAROSCOPIC RADICAL NEPHRECTOMY (Left)  Patient location during evaluation: PACU Anesthesia Type: General Level of consciousness: sedated Pain management: pain level controlled Vital Signs Assessment: post-procedure vital signs reviewed and stable Respiratory status: spontaneous breathing and respiratory function stable Cardiovascular status: stable Anesthetic complications: no    Last Vitals:  Vitals:   10/18/16 1145 10/18/16 1208  BP: 128/81 120/84  Pulse: 66 65  Resp: 14 16  Temp:  36.5 C    Last Pain:  Vitals:   10/18/16 1635  TempSrc:   PainSc: 7                  Georgianne Gritz DANIEL

## 2016-10-18 NOTE — Transfer of Care (Signed)
Immediate Anesthesia Transfer of Care Note  Patient: Henry Ellis  Procedure(s) Performed: Procedure(s): XI ROBOTIC ASSISTED LAPAROSCOPIC RADICAL NEPHRECTOMY (Left)  Patient Location: PACU  Anesthesia Type:General  Level of Consciousness:  sedated, patient cooperative and responds to stimulation  Airway & Oxygen Therapy:Patient Spontanous Breathing and Patient connected to face mask oxgen  Post-op Assessment:  Report given to PACU RN and Post -op Vital signs reviewed and stable  Post vital signs:  Reviewed and stable  Last Vitals:  Vitals:   10/18/16 0619 10/18/16 1102  BP: (!) 150/80   Pulse: (!) 58 80  Resp: 18 14  Temp: 123XX123 C     Complications: No apparent anesthesia complications

## 2016-10-18 NOTE — H&P (Signed)
Henry Ellis is an 60 y.o. male.    Chief Complaint: Pre-op LEFT Radical Nephrectomy  HPI:   1 - Left Renal Cancer - 6cm left lower pole anterior mass incidental on chest CT 08/2016 and confirmed on dedicated abdominal imaging. . No hematuria. Cr 0.8, LFT's normal. Confirmed solid on [place]. Had dedicated contrast MRI pending. Visualized portions of kidney suggest 2 artery / 1 vein left renovascular anatomy. No chest lesions or bulky hilar adenopathy.    PMH sig for DM2, bilateral inguinal hernia repair. 40PY smoker, still smokes. NO CV disease / strong blood thinners. His PCP is Cathlean Cower MD.   Today "Henry Ellis" is seen to proceed with left radical nephrectomy for worriseom left renal mass.    Past Medical History:  Diagnosis Date  . Anemia   . Anxiety   . Arthritis   . Barrett's esophagus   . Carpal tunnel syndrome, right 04/21/2011  . Chronic bronchitis   . ED (erectile dysfunction)   . Esophageal reflux   . Esophageal stricture   . Essential hypertension 10/19/2015  . H/O adenomatous polyp of colon 06/05/2012  . Hiatal hernia   . High cholesterol   . History of Bell's palsy   . IBS (irritable bowel syndrome)   . Personal history of colonic polyps 04/15/2008 & 04/22/2012   TUBULAR ADENOMA  . Type II or unspecified type diabetes mellitus without mention of complication, not stated as uncontrolled   . Ulcerative esophagitis     Past Surgical History:  Procedure Laterality Date  . CARPAL TUNNEL RELEASE     left  . INGUINAL HERNIA REPAIR     bilateral  . KNEE ARTHROSCOPY     right [name]    Family History  Problem Relation Age of Onset  . Heart disease Father   . Lung cancer Father   . Hypertension Brother     THROUGHOUT FAMILY  . Colon cancer Neg Hx    Social History:  reports that he has been smoking Cigarettes.  He has been smoking about 1.00 pack per day. He has never used smokeless tobacco. He reports that he drinks alcohol. He reports that he uses drugs, including  Marijuana, about 2 times per week.  Allergies:  Allergies  Allergen Reactions  . Bee Venom Anaphylaxis  . Aspirin Nausea Only  . Amoxicillin Rash    Did PCN reaction causing immediate rash, facial/tongue/throat swelling, SOB or lightheadedness with hypotension: no Did PCN reaction causing severe rash involving mucus membranes or skin necrosis: no Did PCN reaction that required hospitalization : no Did pcn reaction occurring within the last 10 years: no If all of the above answers are "NO", then may proceed with Cephalosporin use. Pt states he can take low doses of amoxicillin, has taken for dental procedures without reaction    Medications Prior to Admission  Medication Sig Dispense Refill  . Ascorbic Acid (VITAMIN C PO) Take 1 tablet by mouth daily.      Marland Kitchen atorvastatin (LIPITOR) 20 MG tablet Take 1 tablet (20 mg total) by mouth daily at 6 PM. 90 tablet 2  . Cholecalciferol (VITAMIN D3) 1000 UNITS CAPS Take 1 tablet by mouth daily as needed (for when working). Per pt only takes when working    . cyanocobalamin 1000 MCG tablet Take 1,000 mcg by mouth daily as needed (for when working). Per pt only takes when working    . Diclofenac Sodium (PENNSAID) 2 % SOLN Place 2 application onto the skin 2 (two)  times daily. (Patient taking differently: Place 1 application onto the skin 2 (two) times daily as needed (for pain/stiffness of knee and fingers). ) 112 g 3  . docusate sodium (COLACE) 100 MG capsule Take 100 mg by mouth daily.    . ferrous sulfate 325 (65 FE) MG tablet Take 325 mg by mouth daily with breakfast.    . Glucosamine 500 MG CAPS Take 2,000 mg by mouth daily as needed (for when working). Per pt only takes when working    . losartan (COZAAR) 50 MG tablet Take 1 tablet (50 mg total) by mouth daily. 90 tablet 3  . Multiple Vitamin (MULTIVITAMINS PO) Take 1 tablet by mouth daily.      . nabumetone (RELAFEN) 500 MG tablet TAKE 1 TABLET BY MOUTH TWICE DAILY as needed for pain (Patient  taking differently: Take 500 mg by mouth 2 (two) times daily as needed for mild pain or moderate pain. Per pt only takes when working) 60 tablet 5  . omeprazole (PRILOSEC OTC) 20 MG tablet Take 20 mg by mouth daily.    . Psyllium (METAMUCIL) 30.9 % POWD Take 1 packet by mouth daily.       Results for orders placed or performed during the hospital encounter of 10/16/16 (from the past 48 hour(s))  Hemoglobin A1c     Status: Abnormal   Collection Time: 10/16/16 11:45 AM  Result Value Ref Range   Hgb A1c MFr Bld 5.7 (H) 4.8 - 5.6 %    Comment: (NOTE)         Pre-diabetes: 5.7 - 6.4         Diabetes: >6.4         Glycemic control for adults with diabetes: <7.0    Mean Plasma Glucose 117 mg/dL    Comment: (NOTE) Performed At: [facility] [place], [place] [id number] Lindon Romp MD [id number]   Basic metabolic panel     Status: None   Collection Time: 10/16/16 11:45 AM  Result Value Ref Range   Sodium 143 135 - 145 mmol/L   Potassium 4.3 3.5 - 5.1 mmol/L   Chloride 105 101 - 111 mmol/L   CO2 30 22 - 32 mmol/L   Glucose, Bld 92 65 - 99 mg/dL   BUN 18 6 - 20 mg/dL   Creatinine, Ser 0.77 0.61 - 1.24 mg/dL   Calcium 9.3 8.9 - 10.3 mg/dL   GFR calc non Af Amer >60 >60 mL/min   GFR calc Af Amer >60 >60 mL/min    Comment: (NOTE) The eGFR has been calculated using the CKD EPI equation. This calculation has not been validated in all clinical situations. eGFR's persistently <60 mL/min signify possible Chronic Kidney Disease.    Anion gap 8 5 - 15  CBC     Status: Abnormal   Collection Time: 10/16/16 11:45 AM  Result Value Ref Range   WBC 7.4 4.0 - 10.5 K/uL   RBC 4.17 (L) 4.22 - 5.81 MIL/uL   Hemoglobin 13.6 13.0 - 17.0 g/dL   HCT 40.1 39.0 - 52.0 %   MCV 96.2 78.0 - 100.0 fL   MCH 32.6 26.0 - 34.0 pg   MCHC 33.9 30.0 - 36.0 g/dL   RDW 13.7 11.5 - 15.5 %   Platelets 191 150 - 400 K/uL  Type and screen All Cardiac and thoracic surgeries, spinal  fusions, myomectomies, craniotomies, colon & liver resections, total joint revisions, same day c-section with placenta previa or accreta.  Status: None   Collection Time: 10/16/16 11:45 AM  Result Value Ref Range   ABO/RH(D) A POS    Antibody Screen NEG    Sample Expiration 10/30/2016    Extend sample reason NO TRANSFUSIONS OR PREGNANCY IN THE PAST 3 MONTHS   ABO/Rh     Status: None   Collection Time: 10/16/16 11:45 AM  Result Value Ref Range   ABO/RH(D) A POS    No results found.  Review of Systems  Constitutional: Negative.   HENT: Negative.   Eyes: Negative.   Respiratory: Negative.   Cardiovascular: Negative.   Gastrointestinal: Negative.   Genitourinary: Negative.   Musculoskeletal: Negative.   Skin: Negative.   Neurological: Negative.   Endo/Heme/Allergies: Negative.   Psychiatric/Behavioral: Negative.     There were no vitals taken for this visit. Physical Exam  Constitutional: He is oriented to person, place, and time. He appears well-developed.  HENT:  Head: Normocephalic.  Eyes: Pupils are equal, round, and reactive to light.  Neck: Normal range of motion.  Cardiovascular: Normal rate.   Respiratory: Effort normal.  GI: Soft.  Genitourinary:  Genitourinary Comments: No CVAT  Musculoskeletal: Normal range of motion.  Neurological: He is alert and oriented to person, place, and time.  Skin: Skin is warm.  Psychiatric: He has a normal mood and affect. His behavior is normal. Judgment and thought content normal.     Assessment/Plan  1 - Left Renal Cancer - proceed as planned with LEFT robotic radial nephrecotmy. Risks, benefits, expected per-op course discussed previously and reiterated again today.   Alexis Frock, MD 10/18/2016, 6:17 AM

## 2016-10-18 NOTE — Anesthesia Preprocedure Evaluation (Signed)
Anesthesia Evaluation  Patient identified by MRN, date of birth, ID band Patient awake    Reviewed: Allergy & Precautions, NPO status   Airway Mallampati: II       Dental   Pulmonary sleep apnea , Current Smoker,    breath sounds clear to auscultation       Cardiovascular hypertension,  Rhythm:Regular Rate:Normal     Neuro/Psych    GI/Hepatic hiatal hernia, PUD, GERD  ,  Endo/Other  diabetes  Renal/GU      Musculoskeletal  (+) Arthritis ,   Abdominal   Peds  Hematology  (+) anemia ,   Anesthesia Other Findings   Reproductive/Obstetrics                             Anesthesia Physical Anesthesia Plan  ASA: III  Anesthesia Plan: General   Post-op Pain Management:    Induction: Intravenous  Airway Management Planned: Oral ETT  Additional Equipment:   Intra-op Plan:   Post-operative Plan: Possible Post-op intubation/ventilation  Informed Consent: I have reviewed the patients History and Physical, chart, labs and discussed the procedure including the risks, benefits and alternatives for the proposed anesthesia with the patient or authorized representative who has indicated his/her understanding and acceptance.   Dental advisory given  Plan Discussed with: Anesthesiologist and CRNA  Anesthesia Plan Comments:         Anesthesia Quick Evaluation

## 2016-10-18 NOTE — Anesthesia Procedure Notes (Signed)
Procedure Name: Intubation Date/Time: 10/18/2016 8:40 AM Performed by: West Pugh Pre-anesthesia Checklist: Patient identified, Emergency Drugs available, Suction available, Patient being monitored and Timeout performed Patient Re-evaluated:Patient Re-evaluated prior to inductionOxygen Delivery Method: Circle system utilized Preoxygenation: Pre-oxygenation with 100% oxygen Intubation Type: IV induction, Rapid sequence and Inhalational induction with existing ETT Laryngoscope Size: Mac and 4 Grade View: Grade III Tube type: Oral Tube size: 8.0 mm Number of attempts: 1 Airway Equipment and Method: Stylet Placement Confirmation: ETT inserted through vocal cords under direct vision,  positive ETCO2,  CO2 detector and breath sounds checked- equal and bilateral Secured at: 23 cm Tube secured with: Tape Dental Injury: Teeth and Oropharynx as per pre-operative assessment

## 2016-10-18 NOTE — Discharge Instructions (Signed)

## 2016-10-18 NOTE — Brief Op Note (Signed)
10/18/2016  10:46 AM  PATIENT:  Henry Ellis  60 y.o. male  PRE-OPERATIVE DIAGNOSIS:  LEFT RENAL MASS  POST-OPERATIVE DIAGNOSIS:  LEFT RENAL MASS  PROCEDURE:  Procedure(s): XI ROBOTIC ASSISTED LAPAROSCOPIC RADICAL NEPHRECTOMY (Left)  SURGEON:  Surgeon(s) and Role:    * Alexis Frock, MD - Primary  PHYSICIAN ASSISTANT:   ASSISTANTS: Debbrah Alar PA   ANESTHESIA:   local and general  EBL:  Total I/O In: -  Out: 100 [Blood:100]  BLOOD ADMINISTERED:none  DRAINS: none   LOCAL MEDICATIONS USED:  MARCAINE     SPECIMEN:  Source of Specimen:  1 - periaortic lymph nodes; 2 - left radical nephrectomy + adrenal   DISPOSITION OF SPECIMEN:  PATHOLOGY  COUNTS:  YES  TOURNIQUET:  * No tourniquets in log *  DICTATION: .Other Dictation: Dictation Number [id number]  PLAN OF CARE: Admit to inpatient   PATIENT DISPOSITION:  PACU - hemodynamically stable.   Delay start of Pharmacological VTE agent (>24hrs) due to surgical blood loss or risk of bleeding: yes

## 2016-10-19 LAB — BASIC METABOLIC PANEL
Anion gap: 6 (ref 5–15)
BUN: 19 mg/dL (ref 6–20)
CO2: 30 mmol/L (ref 22–32)
CREATININE: 1.58 mg/dL — AB (ref 0.61–1.24)
Calcium: 8.1 mg/dL — ABNORMAL LOW (ref 8.9–10.3)
Chloride: 100 mmol/L — ABNORMAL LOW (ref 101–111)
GFR calc Af Amer: 52 mL/min — ABNORMAL LOW (ref >60)
GFR, EST NON AFRICAN AMERICAN: 45 mL/min — AB (ref >60)
GLUCOSE: 153 mg/dL — AB (ref 65–99)
Potassium: 4.3 mmol/L (ref 3.5–5.1)
SODIUM: 136 mmol/L (ref 135–145)

## 2016-10-19 LAB — HEMOGLOBIN AND HEMATOCRIT, BLOOD
HCT: 39.6 % (ref 39.0–52.0)
Hemoglobin: 12.7 g/dL — ABNORMAL LOW (ref 13.0–17.0)

## 2016-10-19 MED ORDER — SENNOSIDES-DOCUSATE SODIUM 8.6-50 MG PO TABS: 1.0000 | ORAL_TABLET | Freq: Two times a day (BID) | ORAL | 0 refills | Status: DC

## 2016-10-19 NOTE — Progress Notes (Signed)
Patient discharged @ 1611 in stable condition.  Educated pt regarding aftercare, follow up appts, and new meds.  Prescription given.  Discharge instructions given.  Teach back completed.

## 2016-10-19 NOTE — Op Note (Signed)
Henry Ellis, Henry Ellis NO.:  [id number]  MEDICAL RECORD NO.:  [id number]  LOCATION:  [id number]                         FACILITY:  [facility]  PHYSICIAN:  Alexis Frock, MD     DATE OF BIRTH:  05-14-1956  DATE OF PROCEDURE: 10/18/2016                               OPERATIVE REPORT   DIAGNOSES:  Left renal mass and adrenal hypertrophy.  PROCEDURE:  Robotic-assisted laparoscopic left radical nephrectomy.  ESTIMATED BLOOD LOSS:  50 mL.  COMPLICATIONS:  None.  SPECIMEN: 1. Left kidney with adrenal en bloc. 2. Left periaortic lymph nodes.  FINDINGS: 1. Two arteries, 1 vein, left renovascular anatomy as anticipated. 2. Diffuse hypertrophy of left adrenal gland as anticipated. 3. Significant desmoplastic reaction and questionable lymphadenopathy     in the periaortic area on the left.  Representative specimen was     set aside separate for permanent pathology.  ASSISTANT:  Enid Baas, PA.  INDICATION:  Mr. Henry Ellis is a pleasant 60 year old gentleman, who was found incidentally on chest imaging to have a large left renal mass.  It was quite endophytic, enhancing, and solid, concerning for renal cell carcinoma.  He underwent dedicated abdominal imaging, which revealed no additional lesions, 2 arteries, 1 vein, left renovascular anatomy.  As the disease remained clinically localized, he has well-controlled comorbidity, acceptable renal function, it was felt that left radical nephrectomy would be the most prudent means of management and he wished to proceed.  Informed consent was obtained and placed in the medical record.  PROCEDURE IN DETAIL:  The patient, being Henry Ellis, verified procedure being left radical nephrectomy was confirmed.  Procedure was carried out.  Time-out was performed.  Intravenous antibiotics were administered.  General endotracheal anesthesia was induced.  The patient was placed into a left side up full flank position, applying 15 degrees of  stable flexion, superior arm elevator, axillary roll, sequential compression devices, bottom leg bent, top leg straight.  Beanbag was also used.  He was further fashioned on the operating table using 3-inch tape, over foam padding across the supraxiphoid chest, and his pelvis. Sterile field was created by first clipper shaving, then prepping and draping the patient's entire left abdomen and flank using chlorhexidine gluconate.  Notably, a Foley catheter had been placed prior to lateral positioning.  A high-flow, low-pressure pneumoperitoneum was obtained using Veress technique in the left lower quadrant, having passed the aspiration and drop test.  An 8-mm robotic camera port was then placed and positioned approximately 1 handbreadth superolateral to the umbilicus.  Laparoscopic examination of the peritoneal cavity revealed no significant adhesions and no visceral injury.  Additional ports were then placed as follows:  Left subcostal 8-mm robotic port, left far lateral 8-mm robotic port approximately 1 handbreadth superomedial to the anterior iliac spine.  A left inferior paramedian robotic port approximately 1 handbreadth superior to the pubic ramus and 2 assistant port sites in the midline, both 12 mm, 1 approximately 2 fingerbreadths above the camera port and another one 2 fingerbreadths below with the lower end being an AirSeal port.  Robot was docked and passed through the electronic checks.  Initial attention was directed at development of retroperitoneum.  Incision was made lateral to the descending colon from the area of the splenic flexure towards the area of the internal ring. The colon was carefully swept medially.  The colonic mesentery in apposition to Gerota's fascia did have some significant desmoplastic changes consistent with likely inflammation from known tumor.  There was no obvious gross tumor in this area.  The lower pole of kidney was identified, placed on gentle  lateral traction.  Dissection proceeded medial to this.  The left ureter and gonadal vein were encountered, placed on gentle lateral traction.  The psoas muscle was identified. Dissection proceeded within this triangle towards the area of the renal hilum.  Renal hilum consisted of 2 completely separate arteries and a single vein and renovascular anatomy as anticipated.  The adrenal gland was grossly enlarged, but appeared to be diffusely enlarged.  As such, there was appropriately enlarged left adrenal vessels including a separate adrenal vein and adrenal artery noted off the renal branches. Hilum was controlled first with the artery level by an extra large clip proximally followed by vascular load stapler distally as 2 separate staple loads and then controlled with vascular stapler.  This resulted in excellent hemostatic control of the hilum.  In the periaortic area, just beneath the hilar vessels, there was significant swelling and desmoplasia and this was somewhat concerning for possible node-positive disease in this area.  It was felt that node sampling would be warranted.  As such, approximately 5 cm in length column of fiber fatty tissue just lateral to the aorta was carefully mobilized.  Lymphostasis achieved with cold clips.  This was set aside as separate specimen, labeled left periaortic lymph nodes.  The medial and superior small adrenal branches were controlled using vascular load stapler and the superior and lateral attachments were then taken down.  The ureter was doubly clipped and ligated as was the gonadal vein.  This completely freed up the left radical nephrectomy specimen with adrenal en bloc. This was placed in extra large EndoCatch bag.  At this point, all sponge and needle counts were correct.  Hemostasis appeared excellent.  Robot was then undocked.  Specimen was retrieved by extending the previous assistant port sites in the midline and removing the radical  nephrectomy specimen and setting it aside for permanent pathology.  The extraction site was closed at the level of the fascia using figure-of-eight PDS x6, followed by reapproximation of Scarpa's with running Vicryl.  All incision sites were infiltrated with dilute lipolyzed Marcaine and closed at level of skin using subcuticular Monocryl followed by Dermabond and the procedure was terminated.  The patient tolerated procedure well.  There were no immediate periprocedural complications. The patient was taken to the postanesthesia care in stable condition.  Please note, during all robotic portion of the procedure, physician assistant, Debbrah Alar, was crucial for intraoperative retraction, suctioning, application of clips and staplers, acting as a bedside assistant for the duration of the case.          ______________________________ Alexis Frock, MD     TM/MEDQ  D:  10/18/2016  T:  10/19/2016  Job:  [id number]

## 2016-10-19 NOTE — Discharge Summary (Signed)
Physician Discharge Summary  Patient ID: Henry Ellis MRN: [id number] DOB/AGE: 62/13/1957 62 y.o.  Admit date: 10/18/2016 Discharge date: 10/19/2016  Admission Diagnoses: LEFT Renal Neoplasm  Discharge Diagnoses:  Active Problems:   Renal mass   Discharged Condition: good  Hospital Course:   1 - Left Renal Cancer - s/p LEFT robotic radical nephrectomy 10/18/16 for 6cm central mass. Path pending. POD 1 Hgb 12.7, Cr 1.58. By the afternoon of POD 1 he is ambulatory, pain controlled on oral meds, maintaining PO intake with resumed urination and bowel function, and felt to be adequate for discharge.    Consults: None  Significant Diagnostic Studies: labs: as per above  Treatments: surgery:  LEFT robotic radical nephrectomy 10/18/16  Discharge Exam: Blood pressure 126/70, pulse 65, temperature 98.2 F (36.8 C), temperature source Oral, resp. rate 16, height 6' (1.829 m), weight 127 kg (280 lb), SpO2 96 %. General appearance: alert, cooperative and appears stated age Eyes: negative Nose: Nares normal. Septum midline. Mucosa normal. No drainage or sinus tenderness. Throat: lips, mucosa, and tongue normal; teeth and gums normal Neck: supple, symmetrical, trachea midline Back: symmetric, no curvature. ROM normal. No CVA tenderness. Resp: non-labored on room air Cardio: Nl rate GI: soft, non-tender; bowel sounds normal; no masses,  no organomegaly Male genitalia: normal Extremities: extremities normal, atraumatic, no cyanosis or edema Pulses: 2+ and symmetric Skin: Skin color, texture, turgor normal. No rashes or lesions Lymph nodes: Cervical, supraclavicular, and axillary nodes normal. Neurologic: Grossly normal Incision/Wound: Recent port and extraction sites c/d/i. No hernias.   Disposition: Final discharge disposition not confirmed     Medication List    STOP taking these medications   cyanocobalamin 1000 MCG tablet   Glucosamine 500 MG Caps   losartan 50 MG  tablet Commonly known as:  COZAAR   MULTIVITAMINS PO   nabumetone 500 MG tablet Commonly known as:  RELAFEN   VITAMIN C PO   Vitamin D3 1000 units Caps     TAKE these medications   atorvastatin 20 MG tablet Commonly known as:  LIPITOR Take 1 tablet (20 mg total) by mouth daily at 6 PM.   Diclofenac Sodium 2 % Soln Commonly known as:  PENNSAID Place 2 application onto the skin 2 (two) times daily. What changed:  how much to take  when to take this  reasons to take this   docusate sodium 100 MG capsule Commonly known as:  COLACE Take 100 mg by mouth daily.   ferrous sulfate 325 (65 FE) MG tablet Take 325 mg by mouth daily with breakfast.   HYDROcodone-acetaminophen 5-325 MG tablet Commonly known as:  NORCO Take 1-2 tablets by mouth every 6 (six) hours as needed for moderate pain or severe pain.   METAMUCIL 30.9 % Powd Generic drug:  Psyllium Take 1 packet by mouth daily.   omeprazole 20 MG tablet Commonly known as:  PRILOSEC OTC Take 20 mg by mouth daily.   senna-docusate 8.6-50 MG tablet Commonly known as:  Senokot-S Take 1 tablet by mouth 2 (two) times daily. While taking pain meds to prevent constipation.      [facility]   Why:  We will call to arrange Nurse Practitioner visit for post-op check in about 2-3 weeks. Dr. Tresa Moore will call you with pathology results when available.  Contact information: [place] [id number] [phone]           Signed: Alexis Frock  10/19/2016, 2:17 PM

## 2016-10-19 NOTE — Progress Notes (Signed)
1 Day Post-Op  Subjective:  1 - Left Renal Cancer - s/p LEFT robotic radical nephrectomy 10/18/16 for 6cm central mass. Path pending. POD 1 Hgb 12.7, Cr 1.58.   Today "Henry Ellis" is without complaints. Pain controlled. hgb stable. No nausea / emesis.   Objective: Vital signs in last 24 hours: Temp:  [97.4 F (36.3 C)-97.8 F (36.6 C)] 97.7 F (36.5 C) (11/02 0518) Pulse Rate:  [53-80] 67 (11/02 0518) Resp:  [14-16] 14 (11/02 0518) BP: (106-142)/(71-84) 106/75 (11/02 0518) SpO2:  [93 %-100 %] 100 % (11/02 0518)    Intake/Output from previous day: 11/01 0701 - 11/02 0700 In: 3858.8 [P.O.:240; I.V.:3618.8] Out: 895 [Urine:795; Blood:100] Intake/Output this shift: No intake/output data recorded.  General appearance: alert, cooperative and appears stated age Eyes: negative Nose: Nares normal. Septum midline. Mucosa normal. No drainage or sinus tenderness., [place] O2 in place Throat: lips, mucosa, and tongue normal; teeth and gums normal Neck: no adenopathy, no carotid bruit, no JVD, supple, symmetrical, trachea midline and thyroid not enlarged, symmetric, no tenderness/mass/nodules Back: symmetric, no curvature. ROM normal. No CVA tenderness. Resp: non-labored / non-coarse Cardio: Nl rate GI: soft, non-tender; bowel sounds normal; no masses,  no organomegaly Male genitalia: normal, foley removed.  Extremities: extremities normal, atraumatic, no cyanosis or edema Pulses: 2+ and symmetric Skin: Skin color, texture, turgor normal. No rashes or lesions Lymph nodes: Cervical, supraclavicular, and axillary nodes normal. Neurologic: Grossly normal Incision/Wound: Recent port / extraction sites c/d/i.   Lab Results:   Recent Labs  10/16/16 1145 10/18/16 1126 10/19/16 0334  WBC 7.4  --   --   HGB 13.6 13.3 12.7*  HCT 40.1 41.0 39.6  PLT 191  --   --    BMET  Recent Labs  10/16/16 1145 10/19/16 0334  NA 143 136  K 4.3 4.3  CL 105 100*  CO2 30 30  GLUCOSE 92 153*  BUN 18 19   CREATININE 0.77 1.58*  CALCIUM 9.3 8.1*   PT/INR No results for input(s): LABPROT, INR in the last 72 hours. ABG No results for input(s): PHART, HCO3 in the last 72 hours.  Invalid input(s): PCO2, PO2  Studies/Results: No results found.  Anti-infectives: Anti-infectives    Start     Dose/Rate Route Frequency Ordered Stop   10/18/16 0630  clindamycin (CLEOCIN) IVPB 900 mg     900 mg 100 mL/hr over 30 Minutes Intravenous  Once 10/18/16 0620 10/18/16 0915   10/18/16 0620  ciprofloxacin (CIPRO) IVPB 400 mg  Status:  Discontinued     400 mg 200 mL/hr over 60 Minutes Intravenous Every 12 hours 10/18/16 0620 10/18/16 1213      Assessment/Plan:  1 - Left Renal Cancer - doing well POD 1. Saline lock, ambulate, reg diet, DC foley. Goals for discharge discussed, possibly this afternoon v. Tomorrow based on current progress.   [facility], Henry Ellis 10/19/2016

## 2016-10-27 ENCOUNTER — Other Ambulatory Visit (INDEPENDENT_AMBULATORY_CARE_PROVIDER_SITE_OTHER): Payer: Managed Care, Other (non HMO)

## 2016-10-27 ENCOUNTER — Encounter: Payer: Self-pay | Admitting: Internal Medicine

## 2016-10-27 ENCOUNTER — Ambulatory Visit (INDEPENDENT_AMBULATORY_CARE_PROVIDER_SITE_OTHER): Payer: Managed Care, Other (non HMO) | Admitting: Internal Medicine

## 2016-10-27 VITALS — BP 140/80 | HR 79 | Temp 98.0°F | Resp 20 | Wt 273.0 lb

## 2016-10-27 DIAGNOSIS — C642 Malignant neoplasm of left kidney, except renal pelvis: Secondary | ICD-10-CM | POA: Diagnosis not present

## 2016-10-27 DIAGNOSIS — E7800 Pure hypercholesterolemia, unspecified: Secondary | ICD-10-CM | POA: Diagnosis not present

## 2016-10-27 DIAGNOSIS — E119 Type 2 diabetes mellitus without complications: Secondary | ICD-10-CM

## 2016-10-27 DIAGNOSIS — R7302 Impaired glucose tolerance (oral): Secondary | ICD-10-CM

## 2016-10-27 DIAGNOSIS — Z0001 Encounter for general adult medical examination with abnormal findings: Secondary | ICD-10-CM

## 2016-10-27 DIAGNOSIS — F17200 Nicotine dependence, unspecified, uncomplicated: Secondary | ICD-10-CM

## 2016-10-27 DIAGNOSIS — C649 Malignant neoplasm of unspecified kidney, except renal pelvis: Secondary | ICD-10-CM | POA: Insufficient documentation

## 2016-10-27 DIAGNOSIS — I10 Essential (primary) hypertension: Secondary | ICD-10-CM | POA: Diagnosis not present

## 2016-10-27 LAB — BASIC METABOLIC PANEL
BUN: 30 mg/dL — AB (ref 6–23)
CHLORIDE: 104 meq/L (ref 96–112)
CO2: 29 mEq/L (ref 19–32)
Calcium: 9.5 mg/dL (ref 8.4–10.5)
Creatinine, Ser: 1.62 mg/dL — ABNORMAL HIGH (ref 0.40–1.50)
GFR: 46.06 mL/min — ABNORMAL LOW (ref >60.00)
GLUCOSE: 78 mg/dL (ref 70–99)
POTASSIUM: 4.7 meq/L (ref 3.5–5.1)
SODIUM: 140 meq/L (ref 135–145)

## 2016-10-27 LAB — LIPID PANEL
CHOLESTEROL: 145 mg/dL (ref 0–200)
HDL: 39.8 mg/dL (ref >39.00)
LDL Cholesterol: 86 mg/dL (ref 0–99)
NonHDL: 105.01
TRIGLYCERIDES: 93 mg/dL (ref 0.0–149.0)
Total CHOL/HDL Ratio: 4
VLDL: 18.6 mg/dL (ref 0.0–40.0)

## 2016-10-27 LAB — HEPATIC FUNCTION PANEL
ALBUMIN: 4 g/dL (ref 3.5–5.2)
ALK PHOS: 89 U/L (ref 39–117)
ALT: 27 U/L (ref 0–53)
AST: 16 U/L (ref 0–37)
BILIRUBIN TOTAL: 0.4 mg/dL (ref 0.2–1.2)
Bilirubin, Direct: 0.1 mg/dL (ref 0.0–0.3)
Total Protein: 7.5 g/dL (ref 6.0–8.3)

## 2016-10-27 LAB — HEMOGLOBIN A1C: Hgb A1c MFr Bld: 6 % (ref 4.6–6.5)

## 2016-10-27 MED ORDER — VARENICLINE TARTRATE 0.5 MG X 11 & 1 MG X 42 PO MISC: ORAL | 0 refills | Status: DC

## 2016-10-27 MED ORDER — AMLODIPINE BESYLATE 2.5 MG PO TABS: 2.5000 mg | ORAL_TABLET | Freq: Every day | ORAL | 3 refills | Status: DC

## 2016-10-27 MED ORDER — VARENICLINE TARTRATE 1 MG PO TABS: 1.0000 mg | ORAL_TABLET | Freq: Two times a day (BID) | ORAL | 1 refills | Status: DC

## 2016-10-27 NOTE — Progress Notes (Signed)
Subjective:    Patient ID: Henry Ellis, male    DOB: [birth year], 60 y.o.   MRN: [id number]  HPI  Here to f/u; overall doing ok,  Pt denies chest pain, increasing sob or doe, wheezing, orthopnea, PND, increased LE swelling, palpitations, dizziness or syncope.  Pt denies new neurological symptoms such as new headache, or facial or extremity weakness or numbness.  Pt denies polydipsia, polyuria, or low sugar episode.   Pt denies new neurological symptoms such as new headache, or facial or extremity weakness or numbness.   Pt states overall good compliance with meds, mostly trying to follow appropriate diet, with wt overall stable,  but little exercise however. No other changes to hx except: now home since tue nov 7 after staying with sister  Now Postop for: Left Renal Cancer - s/p LEFT robotic radical nephrectomy 10/18/16 for 6cm central mass. Path pending. POD 1 Hgb 12.7, Cr 1.58.  Never used his post op narcotic, has only been doing tylenol due to constipation.  Also currently off BP meds due to lower BP after surgury.   Asks for chantix as has been trying to stop smoking, and none for 1 wk.  Also no ETOH for 1 wk as has usually drinks up to 6 pk per day. Past Medical History:  Diagnosis Date  . Anemia   . Anxiety   . Arthritis   . Barrett's esophagus   . Carpal tunnel syndrome, right 04/21/2011  . Chronic bronchitis   . ED (erectile dysfunction)   . Esophageal reflux   . Esophageal stricture   . Essential hypertension 10/19/2015  . H/O adenomatous polyp of colon 06/05/2012  . Hiatal hernia   . High cholesterol   . History of Bell's palsy   . IBS (irritable bowel syndrome)   . Personal history of colonic polyps 04/15/2008 & 04/22/2012   TUBULAR ADENOMA  . Ulcerative esophagitis    Past Surgical History:  Procedure Laterality Date  . CARPAL TUNNEL RELEASE     left  . INGUINAL HERNIA REPAIR     bilateral  . KNEE ARTHROSCOPY     right [name]  . ROBOT ASSISTED LAPAROSCOPIC NEPHRECTOMY  Left 10/18/2016   Procedure: XI ROBOTIC ASSISTED LAPAROSCOPIC RADICAL NEPHRECTOMY;  Surgeon: Alexis Frock, MD;  Location: [facility];  Service: Urology;  Laterality: Left;    reports that he has been smoking Cigarettes.  He has been smoking about 1.00 pack per day. He has never used smokeless tobacco. He reports that he drinks alcohol. He reports that he uses drugs, including Marijuana, about 2 times per week. family history includes Heart disease in his father; Hypertension in his brother; Lung cancer in his father. Allergies  Allergen Reactions  . Bee Venom Anaphylaxis  . Aspirin Nausea Only  . Amoxicillin Rash    Did PCN reaction causing immediate rash, facial/tongue/throat swelling, SOB or lightheadedness with hypotension: no Did PCN reaction causing severe rash involving mucus membranes or skin necrosis: no Did PCN reaction that required hospitalization : no Did pcn reaction occurring within the last 10 years: no If all of the above answers are "NO", then may proceed with Cephalosporin use. Pt states he can take low doses of amoxicillin, has taken for dental procedures without reaction   Current Outpatient Prescriptions on File Prior to Visit  Medication Sig Dispense Refill  . atorvastatin (LIPITOR) 20 MG tablet Take 1 tablet (20 mg total) by mouth daily at 6 PM. 90 tablet 2  . Diclofenac Sodium (  PENNSAID) 2 % SOLN Place 2 application onto the skin 2 (two) times daily. (Patient taking differently: Place 1 application onto the skin 2 (two) times daily as needed (for pain/stiffness of knee and fingers). ) 112 g 3  . docusate sodium (COLACE) 100 MG capsule Take 100 mg by mouth daily.    . ferrous sulfate 325 (65 FE) MG tablet Take 325 mg by mouth daily with breakfast.    . HYDROcodone-acetaminophen (NORCO) 5-325 MG tablet Take 1-2 tablets by mouth every 6 (six) hours as needed for moderate pain or severe pain. 30 tablet 0  . omeprazole (PRILOSEC OTC) 20 MG tablet Take 20 mg by mouth daily.      . Psyllium (METAMUCIL) 30.9 % POWD Take 1 packet by mouth daily.     Marland Kitchen senna-docusate (SENOKOT-S) 8.6-50 MG tablet Take 1 tablet by mouth 2 (two) times daily. While taking pain meds to prevent constipation. 30 tablet 0   No current facility-administered medications on file prior to visit.     Review of Systems  Constitutional: Negative for unusual diaphoresis or night sweats HENT: Negative for ear swelling or discharge Eyes: Negative for worsening visual haziness  Respiratory: Negative for choking and stridor.   Gastrointestinal: Negative for distension or worsening eructation Genitourinary: Negative for retention or change in urine volume.  Musculoskeletal: Negative for other MSK pain or swelling Skin: Negative for color change and worsening wound Neurological: Negative for tremors and numbness other than noted  Psychiatric/Behavioral: Negative for decreased concentration or agitation other than above   All other system neg per pt    Objective:   Physical Exam BP 140/80   Pulse 79   Temp 98 F (36.7 C) (Oral)   Resp 20   Wt 273 lb (123.8 kg)   SpO2 94%   BMI 37.03 kg/m  VS noted,  Constitutional: Pt appears in no apparent distress HENT: Head: NCAT.  Right Ear: External ear normal.  Left Ear: External ear normal.  Eyes: . Pupils are equal, round, and reactive to light. Conjunctivae and EOM are normal Neck: Normal range of motion. Neck supple.  Cardiovascular: Normal rate and regular rhythm.   Pulmonary/Chest: Effort normal and breath sounds without rales or wheezing.  Abd:  Soft, NT, ND, + BS Neurological: Pt is alert. Not confused , motor grossly intact Skin: Skin is warm. No rash, no LE edema Psychiatric: Pt behavior is normal. No agitation.  No new exam changes    Assessment & Plan:

## 2016-10-27 NOTE — Progress Notes (Signed)
Pre visit review using our clinic review tool, if applicable. No additional management support is needed unless otherwise documented below in the visit note. 

## 2016-10-27 NOTE — Patient Instructions (Signed)
Please take all new medication as prescribed - the chantix, as well as the amlodipine 2.5 gm per day  Please hold off on re-starting the losartan for now, as you have just the one kidney and your renal function was slow at last discharge  Please make a note of your Blood Pressure when you follow up with Urology, and call with the result if it is > 140/90  Please continue all other medications as before, and refills have been done if requested.  Please have the pharmacy call with any other refills you may need.  Please continue your efforts at being more active, low cholesterol diet, and weight control.  Please keep your appointments with your specialists as you may have planned  Please return in 3 months, or sooner if needed, with Lab testing done 3-5 days before

## 2016-10-28 NOTE — Assessment & Plan Note (Signed)
stable overall by history and exam, recent data reviewed with pt, and pt to continue medical treatment as before,  to f/u any worsening symptoms or concerns Lab Results  Component Value Date   LDLCALC 86 10/27/2016

## 2016-10-28 NOTE — Assessment & Plan Note (Addendum)
Mild uncontrolled, to add amlodipine 2.5 qd, o/w stable overall by history and exam, recent data reviewed with pt, and pt to continue medical treatment as before,  to f/u any worsening symptoms or concerns BP Readings from Last 3 Encounters:  10/27/16 140/80  10/19/16 126/70  10/16/16 (!) 152/80

## 2016-10-28 NOTE — Assessment & Plan Note (Signed)
Ok for chantix asd,  to f/u any worsening symptoms or concerns 

## 2016-10-28 NOTE — Assessment & Plan Note (Signed)
Nov 2017 Stage 3 s/p left nephrectomy - Dr Manning/urology, f/u urology post op as planned

## 2016-10-28 NOTE — Assessment & Plan Note (Signed)
stable overall by history and exam, recent data reviewed with pt, and pt to continue medical treatment as before,  to f/u any worsening symptoms or concerns Lab Results  Component Value Date   HGBA1C 6.0 10/27/2016

## 2017-01-17 NOTE — Progress Notes (Signed)
[facility] [address], [place] [address] Phone: [phone] Subjective:    I'm seeing this patient by the request  of:  Cathlean Cower, MD   CC: right hand pain f/u  [id number]  Henry Ellis is a 61 y.o. male coming in with complaint of right hand pain Patient was seen 8 months ago and did have a trigger finger that needed injection. Patient states same finger, worsening pain last 4 weeks. Starting to worsen again since he started working again. Patient states that it is worse than previous.*Central also trigger 100 times a day.      Past Medical History:  Diagnosis Date  . Anemia   . Anxiety   . Arthritis   . Barrett's esophagus   . Carpal tunnel syndrome, right 04/21/2011  . Chronic bronchitis   . ED (erectile dysfunction)   . Esophageal reflux   . Esophageal stricture   . Essential hypertension 10/19/2015  . H/O adenomatous polyp of colon 06/05/2012  . Hiatal hernia   . High cholesterol   . History of Bell's palsy   . IBS (irritable bowel syndrome)   . Personal history of colonic polyps 04/15/2008 & 04/22/2012   TUBULAR ADENOMA  . Ulcerative esophagitis    Past Surgical History:  Procedure Laterality Date  . CARPAL TUNNEL RELEASE     left  . INGUINAL HERNIA REPAIR     bilateral  . KNEE ARTHROSCOPY     right [name]  . ROBOT ASSISTED LAPAROSCOPIC NEPHRECTOMY Left 10/18/2016   Procedure: XI ROBOTIC ASSISTED LAPAROSCOPIC RADICAL NEPHRECTOMY;  Surgeon: Alexis Frock, MD;  Location: [facility];  Service: Urology;  Laterality: Left;   Social History   Social History  . Marital status: Single    Spouse name: N/A  . Number of children: N/A  . Years of education: N/A   Occupational History  . 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 Smoking status: Current Every Day Smoker    Packs/day: 1.00    Types: Cigarettes  . Smokeless tobacco: Never Used     Comment: Counseling sheet given 03-2012   . Alcohol  use 0.0 oz/week     Comment: 6 pack daily  . Drug use: Yes    Frequency: 2.0 times per week    Types: Marijuana     Comment: last joint 3 months ago  . Sexual activity: Not Asked   Other Topics Concern  . None   Social History Narrative  . None   Allergies  Allergen Reactions  . Bee Venom Anaphylaxis  . Aspirin Nausea Only  . Amoxicillin Rash    Did PCN reaction causing immediate rash, facial/tongue/throat swelling, SOB or lightheadedness with hypotension: no Did PCN reaction causing severe rash involving mucus membranes or skin necrosis: no Did PCN reaction that required hospitalization : no Did pcn reaction occurring within the last 10 years: no If all of the above answers are "NO", then may proceed with Cephalosporin use. Pt states he can take low doses of amoxicillin, has taken for dental procedures without reaction   Family History  Problem Relation Age of Onset  . Heart disease Father   . Lung cancer Father   . Hypertension Brother     THROUGHOUT FAMILY  . Colon cancer Neg Hx     Past medical history, social, surgical and family history all reviewed in electronic medical record.  No pertanent information unless stated regarding to the chief  complaint.   Review of Systems: No headache, visual changes, nausea, vomiting, diarrhea, constipation, dizziness, abdominal pain, skin rash, fevers, chills, night sweats, weight loss, swollen lymph nodes, body aches, joint swelling, muscle aches, chest pain, shortness of breath, mood changes.    Objective  Blood pressure 132/80, pulse 61, height 6\' 1"  (1.854 m), weight 272 lb (123.4 kg), SpO2 98 %.  Systems examined below as of 01/18/17 General: NAD A&O x3 mood, affect normal  HEENT: Pupils equal, extraocular movements intact no nystagmus Respiratory: not short of breath at rest or with speaking Cardiovascular: No lower extremity edema, non tender Skin: Warm dry intact with no signs of infection or rash on extremities or on  axial skeleton. Abdomen: Soft nontender, no masses Neuro: Cranial nerves  intact, neurovascularly intact in all extremities with 2+ DTRs and 2+ pulses. Lymph: No lymphadenopathy appreciated today  Gait normal with good balance and coordination.  MSK: Non tender with full range of motion and good stability and symmetric strength and tone of shoulders, elbows, wrist,  knee hips and ankles bilaterally.   Right-hand exam shows the patient does have a trigger finger of the fourth finger over the A2 pulley. Large nodule than previously. Patient does have triggering noted today. Neurovascular intact distally.  Procedure: Real-time Ultrasound Guided Injection of right 4th flexor tendon sheath.  Device: GE Logiq Q7 Ultrasound guided injection is preferred based studies that show increased duration, increased effect, greater accuracy, decreased procedural pain, increased response rate, and decreased cost with ultrasound guided versus blind injection.  Verbal informed consent obtained.  Time-out conducted.  Noted no overlying erythema, induration, or other signs of local infection.  Skin prepped in a sterile fashion.  Local anesthesia: Topical Ethyl chloride.  With sterile technique and under real time ultrasound guidance: with 25g half-inch needle patient was injected with a total of 0.5 mL of 0.5% Marcaine and 0.5 mL of Kenalog 40 mg/dL. Completed without difficulty  Pain immediately resolved suggesting accurate placement of the medication.  Advised to call if fevers/chills, erythema, induration, drainage, or persistent bleeding.  Images permanently stored and available for review in the ultrasound unit.  Impression: Technically successful ultrasound guided injection.   Impression and Recommendations:     This case required medical decision making of moderate complexity.      Note: This dictation was prepared with Dragon dictation along with smaller phrase technology. Any transcriptional  errors that result from this process are unintentional.

## 2017-01-18 ENCOUNTER — Ambulatory Visit (INDEPENDENT_AMBULATORY_CARE_PROVIDER_SITE_OTHER): Payer: 59 | Admitting: Family Medicine

## 2017-01-18 ENCOUNTER — Ambulatory Visit (INDEPENDENT_AMBULATORY_CARE_PROVIDER_SITE_OTHER): Payer: 59 | Admitting: Internal Medicine

## 2017-01-18 ENCOUNTER — Ambulatory Visit: Payer: Self-pay

## 2017-01-18 ENCOUNTER — Encounter: Payer: Self-pay | Admitting: *Deleted

## 2017-01-18 ENCOUNTER — Other Ambulatory Visit (INDEPENDENT_AMBULATORY_CARE_PROVIDER_SITE_OTHER): Payer: 59

## 2017-01-18 ENCOUNTER — Encounter: Payer: Self-pay | Admitting: Family Medicine

## 2017-01-18 VITALS — BP 140/80 | HR 86 | Temp 98.4°F | Resp 20 | Wt 272.0 lb

## 2017-01-18 VITALS — BP 132/80 | HR 61 | Ht 73.0 in | Wt 272.0 lb

## 2017-01-18 DIAGNOSIS — Z0001 Encounter for general adult medical examination with abnormal findings: Secondary | ICD-10-CM | POA: Diagnosis not present

## 2017-01-18 DIAGNOSIS — M65341 Trigger finger, right ring finger: Secondary | ICD-10-CM

## 2017-01-18 DIAGNOSIS — R7302 Impaired glucose tolerance (oral): Secondary | ICD-10-CM

## 2017-01-18 DIAGNOSIS — I10 Essential (primary) hypertension: Secondary | ICD-10-CM | POA: Diagnosis not present

## 2017-01-18 LAB — CBC WITH DIFFERENTIAL/PLATELET
BASOS ABS: 0.1 10*3/uL (ref 0.0–0.1)
BASOS PCT: 1 % (ref 0.0–3.0)
EOS ABS: 0.2 10*3/uL (ref 0.0–0.7)
Eosinophils Relative: 2.8 % (ref 0.0–5.0)
HCT: 35.7 % — ABNORMAL LOW (ref 39.0–52.0)
HEMOGLOBIN: 12.1 g/dL — AB (ref 13.0–17.0)
Lymphocytes Relative: 21.1 % (ref 12.0–46.0)
Lymphs Abs: 1.3 10*3/uL (ref 0.7–4.0)
MCHC: 33.9 g/dL (ref 30.0–36.0)
MCV: 92.3 fl (ref 78.0–100.0)
MONO ABS: 0.6 10*3/uL (ref 0.1–1.0)
Monocytes Relative: 9.6 % (ref 3.0–12.0)
Neutro Abs: 4 10*3/uL (ref 1.4–7.7)
Neutrophils Relative %: 65.5 % (ref 43.0–77.0)
Platelets: 256 10*3/uL (ref 150.0–400.0)
RBC: 3.87 Mil/uL — AB (ref 4.22–5.81)
RDW: 13.7 % (ref 11.5–15.5)
WBC: 6.1 10*3/uL (ref 4.0–10.5)

## 2017-01-18 LAB — BASIC METABOLIC PANEL
BUN: 31 mg/dL — AB (ref 6–23)
CHLORIDE: 107 meq/L (ref 96–112)
CO2: 31 mEq/L (ref 19–32)
CREATININE: 1.36 mg/dL (ref 0.40–1.50)
Calcium: 9.4 mg/dL (ref 8.4–10.5)
GFR: 56.32 mL/min — ABNORMAL LOW (ref >60.00)
GLUCOSE: 96 mg/dL (ref 70–99)
Potassium: 4.7 mEq/L (ref 3.5–5.1)
Sodium: 142 mEq/L (ref 135–145)

## 2017-01-18 LAB — URINALYSIS, ROUTINE W REFLEX MICROSCOPIC
Bilirubin Urine: NEGATIVE
HGB URINE DIPSTICK: NEGATIVE
KETONES UR: NEGATIVE
Leukocytes, UA: NEGATIVE
NITRITE: NEGATIVE
RBC / HPF: NONE SEEN (ref 0–?)
Specific Gravity, Urine: 1.015 (ref 1.000–1.030)
TOTAL PROTEIN, URINE-UPE24: NEGATIVE
URINE GLUCOSE: NEGATIVE
UROBILINOGEN UA: 0.2 (ref 0.0–1.0)
pH: 6 (ref 5.0–8.0)

## 2017-01-18 LAB — LIPID PANEL
CHOL/HDL RATIO: 3
CHOLESTEROL: 127 mg/dL (ref 0–200)
HDL: 41.3 mg/dL (ref >39.00)
LDL Cholesterol: 72 mg/dL (ref 0–99)
NonHDL: 86.05
TRIGLYCERIDES: 71 mg/dL (ref 0.0–149.0)
VLDL: 14.2 mg/dL (ref 0.0–40.0)

## 2017-01-18 LAB — HEMOGLOBIN A1C: Hgb A1c MFr Bld: 5.8 % (ref 4.6–6.5)

## 2017-01-18 LAB — HEPATIC FUNCTION PANEL
ALBUMIN: 4.2 g/dL (ref 3.5–5.2)
ALT: 24 U/L (ref 0–53)
AST: 26 U/L (ref 0–37)
Alkaline Phosphatase: 106 U/L (ref 39–117)
BILIRUBIN TOTAL: 0.4 mg/dL (ref 0.2–1.2)
Bilirubin, Direct: 0.1 mg/dL (ref 0.0–0.3)
TOTAL PROTEIN: 7.2 g/dL (ref 6.0–8.3)

## 2017-01-18 NOTE — Patient Instructions (Signed)
You had the Prevnar pneumonia shot    Please continue all other medications as before, and refills have been done if requested.  Please have the pharmacy call with any other refills you may need.  Please continue your efforts at being more active, low cholesterol diet, and weight control.  You are otherwise up to date with prevention measures today.  Please keep your appointments with your specialists as you may have planned  Please go to the LAB in the Basement (turn left off the elevator) for the tests to be done today  You will be contacted by phone if any changes need to be made immediately.  Otherwise, you will receive a letter about your results with an explanation, but please check with MyChart first.  Please remember to sign up for MyChart if you have not done so, as this will be important to you in the future with finding out test results, communicating by private email, and scheduling acute appointments online when needed.  Please return in 6 months, or sooner if needed, with Lab testing done 3-5 days before

## 2017-01-18 NOTE — Assessment & Plan Note (Signed)
Worsening symptoms. Patient was given a repeat injection again today. Tolerated the procedure well. We discussed again post injection injections. We discussed likely this will need to be repeated intermittently. We discussed bracing at night. Icing regimen. Follow-up again in 4 weeks if not improved.

## 2017-01-18 NOTE — Progress Notes (Signed)
Subjective:    Patient ID: Henry Ellis, male    DOB: 12-23-1955, 61 y.o.   MRN: [id number]  HPI  Here for wellness and f/u;  Overall doing ok;  Pt denies Chest pain, worsening SOB, DOE, wheezing, orthopnea, PND, worsening LE edema, palpitations, dizziness or syncope.  Pt denies neurological change such as new headache, facial or extremity weakness.  Pt denies polydipsia, polyuria, or low sugar symptoms. Pt states overall good compliance with treatment and medications, good tolerability, and has been trying to follow appropriate diet.  Pt denies worsening depressive symptoms, suicidal ideation or panic. No fever, night sweats, wt loss, loss of appetite, or other constitutional symptoms.  Pt states good ability with ADL's, has low fall risk, home safety reviewed and adequate, no other significant changes in hearing or vision, and only occasionally active with exercise, but plans to do more soon. BP Readings from Last 3 Encounters:  01/18/17 140/80  01/18/17 132/80  10/27/16 140/80   Wt Readings from Last 3 Encounters:  01/18/17 272 lb (123.4 kg)  01/18/17 272 lb (123.4 kg)  10/27/16 273 lb (123.8 kg)  Here also after nov 2017 renal cell ca surgury/nephrectomy, saw Dr Joelyn Oms in f/u with renal insufficiency; per pt will be fu there this month;   Past Medical History:  Diagnosis Date  . Anemia   . Anxiety   . Arthritis   . Barrett's esophagus   . Carpal tunnel syndrome, right 04/21/2011  . Chronic bronchitis   . ED (erectile dysfunction)   . Esophageal reflux   . Esophageal stricture   . Essential hypertension 10/19/2015  . H/O adenomatous polyp of colon 06/05/2012  . Hiatal hernia   . High cholesterol   . History of Bell's palsy   . IBS (irritable bowel syndrome)   . Personal history of colonic polyps 04/15/2008 & 04/22/2012   TUBULAR ADENOMA  . Ulcerative esophagitis    Past Surgical History:  Procedure Laterality Date  . CARPAL TUNNEL RELEASE     left  . INGUINAL HERNIA REPAIR       bilateral  . KNEE ARTHROSCOPY     right [name]  . ROBOT ASSISTED LAPAROSCOPIC NEPHRECTOMY Left 10/18/2016   Procedure: XI ROBOTIC ASSISTED LAPAROSCOPIC RADICAL NEPHRECTOMY;  Surgeon: Alexis Frock, MD;  Location: [facility];  Service: Urology;  Laterality: Left;    reports that he has been smoking Cigarettes.  He has been smoking about 1.00 pack per day. He has never used smokeless tobacco. He reports that he drinks alcohol. He reports that he uses drugs, including Marijuana, about 2 times per week. family history includes Heart disease in his father; Hypertension in his brother; Lung cancer in his father. Allergies  Allergen Reactions  . Bee Venom Anaphylaxis  . Aspirin Nausea Only  . Amoxicillin Rash    Did PCN reaction causing immediate rash, facial/tongue/throat swelling, SOB or lightheadedness with hypotension: no Did PCN reaction causing severe rash involving mucus membranes or skin necrosis: no Did PCN reaction that required hospitalization : no Did pcn reaction occurring within the last 10 years: no If all of the above answers are "NO", then may proceed with Cephalosporin use. Pt states he can take low doses of amoxicillin, has taken for dental procedures without reaction   Current Outpatient Prescriptions on File Prior to Visit  Medication Sig Dispense Refill  . amLODipine (NORVASC) 2.5 MG tablet Take 1 tablet (2.5 mg total) by mouth daily. 90 tablet 3  . atorvastatin (LIPITOR) 20 MG  tablet Take 1 tablet (20 mg total) by mouth daily at 6 PM. 90 tablet 2  . docusate sodium (COLACE) 100 MG capsule Take 100 mg by mouth daily.    . ferrous sulfate 325 (65 FE) MG tablet Take 325 mg by mouth daily with breakfast.    . omeprazole (PRILOSEC OTC) 20 MG tablet Take 20 mg by mouth daily.    . Psyllium (METAMUCIL) 30.9 % POWD Take 1 packet by mouth daily.      No current facility-administered medications on file prior to visit.    Review of Systems Constitutional: Negative for  increased diaphoresis, or other activity, appetite or siginficant weight change other than noted HENT: Negative for worsening hearing loss, ear pain, facial swelling, mouth sores and neck stiffness.   Eyes: Negative for other worsening pain, redness or visual disturbance.  Respiratory: Negative for choking or stridor Cardiovascular: Negative for other chest pain and palpitations.  Gastrointestinal: Negative for worsening diarrhea, blood in stool, or abdominal distention Genitourinary: Negative for hematuria, flank pain or change in urine volume.  Musculoskeletal: Negative for myalgias or other joint complaints.  Skin: Negative for other color change and wound or drainage.  Neurological: Negative for syncope and numbness. other than noted Hematological: Negative for adenopathy. or other swelling Psychiatric/Behavioral: Negative for hallucinations, SI, self-injury, decreased concentration or other worsening agitation.  All other system neg per pt    Objective:   Physical Exam BP 140/80   Pulse 86   Temp 98.4 F (36.9 C) (Oral)   Resp 20   Wt 272 lb (123.4 kg)   SpO2 96%   BMI 35.89 kg/m  VS noted, obese Constitutional: Pt is oriented to person, place, and time. Appears well-developed and well-nourished, in no significant distress Head: Normocephalic and atraumatic  Eyes: Conjunctivae and EOM are normal. Pupils are equal, round, and reactive to light Right Ear: External ear normal.  Left Ear: External ear normal Nose: Nose normal.  Mouth/Throat: Oropharynx is clear and moist  Neck: Normal range of motion. Neck supple. No JVD present. No tracheal deviation present or significant neck LA or mass Cardiovascular: Normal rate, regular rhythm, normal heart sounds and intact distal pulses.   Pulmonary/Chest: Effort normal and breath sounds without rales or wheezing  Abdominal: Soft. Bowel sounds are normal. NT. No HSM  Musculoskeletal: Normal range of motion. Exhibits no  edema Lymphadenopathy: Has no cervical adenopathy.  Neurological: Pt is alert and oriented to person, place, and time. Pt has normal reflexes. No cranial nerve deficit. Motor grossly intact Skin: Skin is warm and dry. No rash noted or new ulcers Psychiatric:  Has normal mood and affect. Behavior is normal.  No other new exam findings    Assessment & Plan:

## 2017-01-18 NOTE — Progress Notes (Signed)
Pre visit review using our clinic review tool, if applicable. No additional management support is needed unless otherwise documented below in the visit note. 

## 2017-01-19 LAB — TSH: TSH: 0.66 u[IU]/mL (ref 0.35–4.50)

## 2017-01-19 LAB — PSA: PSA: 3.02 ng/mL (ref 0.10–4.00)

## 2017-02-15 DIAGNOSIS — I10 Essential (primary) hypertension: Secondary | ICD-10-CM | POA: Diagnosis not present

## 2017-02-15 DIAGNOSIS — N183 Chronic kidney disease, stage 3 (moderate): Secondary | ICD-10-CM | POA: Diagnosis not present

## 2017-03-15 ENCOUNTER — Encounter: Payer: Self-pay | Admitting: Gastroenterology

## 2017-04-16 ENCOUNTER — Encounter: Payer: Self-pay | Admitting: Internal Medicine

## 2017-04-27 ENCOUNTER — Other Ambulatory Visit: Payer: Self-pay | Admitting: Internal Medicine

## 2017-05-04 DIAGNOSIS — M79675 Pain in left toe(s): Secondary | ICD-10-CM | POA: Diagnosis not present

## 2017-05-04 DIAGNOSIS — B351 Tinea unguium: Secondary | ICD-10-CM | POA: Diagnosis not present

## 2017-05-04 DIAGNOSIS — R935 Abnormal findings on diagnostic imaging of other abdominal regions, including retroperitoneum: Secondary | ICD-10-CM | POA: Diagnosis not present

## 2017-05-04 DIAGNOSIS — C642 Malignant neoplasm of left kidney, except renal pelvis: Secondary | ICD-10-CM | POA: Diagnosis not present

## 2017-05-04 DIAGNOSIS — M79674 Pain in right toe(s): Secondary | ICD-10-CM | POA: Diagnosis not present

## 2017-05-04 DIAGNOSIS — L600 Ingrowing nail: Secondary | ICD-10-CM | POA: Diagnosis not present

## 2017-05-10 DIAGNOSIS — N183 Chronic kidney disease, stage 3 (moderate): Secondary | ICD-10-CM | POA: Diagnosis not present

## 2017-05-10 DIAGNOSIS — Z905 Acquired absence of kidney: Secondary | ICD-10-CM | POA: Diagnosis not present

## 2017-05-10 DIAGNOSIS — C642 Malignant neoplasm of left kidney, except renal pelvis: Secondary | ICD-10-CM | POA: Diagnosis not present

## 2017-06-05 DIAGNOSIS — B351 Tinea unguium: Secondary | ICD-10-CM | POA: Diagnosis not present

## 2017-06-25 ENCOUNTER — Encounter: Payer: Self-pay | Admitting: Internal Medicine

## 2017-06-25 ENCOUNTER — Ambulatory Visit: Payer: 59 | Admitting: *Deleted

## 2017-06-25 VITALS — Ht 73.0 in | Wt 277.2 lb

## 2017-06-25 DIAGNOSIS — Z86010 Personal history of colonic polyps: Secondary | ICD-10-CM

## 2017-06-25 MED ORDER — NA SULFATE-K SULFATE-MG SULF 17.5-3.13-1.6 GM/177ML PO SOLN: ORAL | 0 refills | Status: DC

## 2017-06-25 NOTE — Progress Notes (Signed)
No allergies to eggs or soy. No problems with anesthesia.  Pt given Emmi instructions for colonoscopy  No oxygen use  No diet drug use  

## 2017-07-09 ENCOUNTER — Ambulatory Visit (AMBULATORY_SURGERY_CENTER): Payer: 59 | Admitting: Internal Medicine

## 2017-07-09 ENCOUNTER — Encounter: Payer: Self-pay | Admitting: Internal Medicine

## 2017-07-09 VITALS — BP 145/78 | HR 52 | Temp 96.9°F | Resp 12 | Ht 73.0 in | Wt 277.0 lb

## 2017-07-09 DIAGNOSIS — D123 Benign neoplasm of transverse colon: Secondary | ICD-10-CM

## 2017-07-09 DIAGNOSIS — D125 Benign neoplasm of sigmoid colon: Secondary | ICD-10-CM

## 2017-07-09 DIAGNOSIS — D124 Benign neoplasm of descending colon: Secondary | ICD-10-CM | POA: Diagnosis not present

## 2017-07-09 DIAGNOSIS — D122 Benign neoplasm of ascending colon: Secondary | ICD-10-CM | POA: Diagnosis not present

## 2017-07-09 DIAGNOSIS — Z86010 Personal history of colonic polyps: Secondary | ICD-10-CM | POA: Diagnosis present

## 2017-07-09 MED ORDER — SODIUM CHLORIDE 0.9 % IV SOLN: 500.0000 mL | INTRAVENOUS | Status: DC

## 2017-07-09 NOTE — Progress Notes (Signed)
Called to room to assist during endoscopic procedure.  Patient ID and intended procedure confirmed with present staff. Received instructions for my participation in the procedure from the performing physician.  

## 2017-07-09 NOTE — Op Note (Signed)
[facility] Patient Name: Henry Ellis Procedure Date: 07/09/2017 10:17 AM MRN: [id number] Endoscopist: Jerene Bears , MD Age: 61 Referring MD:  Date of Birth: 1956-04-02 Gender: Male Account #: [id number] Procedure:                Colonoscopy Indications:              Surveillance: Personal history of adenomatous                            polyps on last colonoscopy 5 years ago Medicines:                Monitored Anesthesia Care Procedure:                Pre-Anesthesia Assessment:                           - Prior to the procedure, a History and Physical                            was performed, and patient medications and                            allergies were reviewed. The patient's tolerance of                            previous anesthesia was also reviewed. The risks                            and benefits of the procedure and the sedation                            options and risks were discussed with the patient.                            All questions were answered, and informed consent                            was obtained. Prior Anticoagulants: The patient has                            taken no previous anticoagulant or antiplatelet                            agents. ASA Grade Assessment: II - A patient with                            mild systemic disease. After reviewing the risks                            and benefits, the patient was deemed in                            satisfactory condition to undergo the procedure.  After obtaining informed consent, the colonoscope                            was passed under direct vision. Throughout the                            procedure, the patient's blood pressure, pulse, and                            oxygen saturations were monitored continuously. The                            Colonoscope was introduced through the anus and                            advanced to the the cecum,  identified by                            appendiceal orifice and ileocecal valve. The                            colonoscopy was performed without difficulty. The                            patient tolerated the procedure well. The quality                            of the bowel preparation was good. The ileocecal                            valve, appendiceal orifice, and rectum were                            photographed. Scope In: 10:42:14 AM Scope Out: 84:69:62 AM Scope Withdrawal Time: 0 hours 12 minutes 36 seconds  Total Procedure Duration: 0 hours 14 minutes 44 seconds  Findings:                 The perianal exam findings include skin tags.                           A 6 mm polyp was found in the ascending colon. The                            polyp was flat. The polyp was removed with a cold                            snare. Resection and retrieval were complete.                           A 3 mm polyp was found in the proximal transverse                            colon. The polyp was sessile. The  polyp was removed                            with a cold snare. Resection and retrieval were                            complete.                           Two sessile polyps were found in the descending                            colon. The polyps were 2 to 5 mm in size. These                            polyps were removed with a cold snare. Resection                            and retrieval were complete.                           Two sessile polyps were found in the sigmoid colon.                            The polyps were 4 to 5 mm in size. These polyps                            were removed with a cold snare. Resection and                            retrieval were complete.                           Multiple small-mouthed diverticula were found in                            the sigmoid colon.                           The retroflexed view of the distal rectum and anal                             verge was normal and showed no anal or rectal                            abnormalities. Complications:            No immediate complications. Estimated Blood Loss:     Estimated blood loss was minimal. Impression:               - Perianal skin tags found on perianal exam.                           - One 6 mm polyp in the ascending colon, removed  with a cold snare. Resected and retrieved.                           - One 3 mm polyp in the proximal transverse colon,                            removed with a cold snare. Resected and retrieved.                           - Two 2 to 5 mm polyps in the descending colon,                            removed with a cold snare. Resected and retrieved.                           - Two 4 to 5 mm polyps in the sigmoid colon,                            removed with a cold snare. Resected and retrieved.                           - Diverticulosis in the sigmoid colon.                           - The distal rectum and anal verge are normal on                            retroflexion view. Recommendation:           - Patient has a contact number available for                            emergencies. The signs and symptoms of potential                            delayed complications were discussed with the                            patient. Return to normal activities tomorrow.                            Written discharge instructions were provided to the                            patient.                           - Resume previous diet.                           - Continue present medications.                           - Await pathology results.                           -  Repeat colonoscopy date to be determined after                            pending pathology results are reviewed for                            surveillance. Jerene Bears, MD 07/09/2017 11:02:12 AM This report has been signed electronically.

## 2017-07-09 NOTE — Patient Instructions (Signed)
Impression/Recommendations:  Polyp handout given to patient. Diverticulosis handout given to patient.  Resume previous diet. Continue present medications.  Repeat colonoscopy recommended.  Date to be determined after pathology results reviewed.  YOU HAD AN ENDOSCOPIC PROCEDURE TODAY AT THE [facility]:   Refer to the procedure report that was given to you for any specific questions about what was found during the examination.  If the procedure report does not answer your questions, please call your gastroenterologist to clarify.  If you requested that your care partner not be given the details of your procedure findings, then the procedure report has been included in a sealed envelope for you to review at your convenience later.  YOU SHOULD EXPECT: Some feelings of bloating in the abdomen. Passage of more gas than usual.  Walking can help get rid of the air that was put into your GI tract during the procedure and reduce the bloating. If you had a lower endoscopy (such as a colonoscopy or flexible sigmoidoscopy) you may notice spotting of blood in your stool or on the toilet paper. If you underwent a bowel prep for your procedure, you may not have a normal bowel movement for a few days.  Please Note:  You might notice some irritation and congestion in your nose or some drainage.  This is from the oxygen used during your procedure.  There is no need for concern and it should clear up in a day or so.  SYMPTOMS TO REPORT IMMEDIATELY:   Following lower endoscopy (colonoscopy or flexible sigmoidoscopy):  Excessive amounts of blood in the stool  Significant tenderness or worsening of abdominal pains  Swelling of the abdomen that is new, acute  Fever of 100F or higher For urgent or emergent issues, a gastroenterologist can be reached at any hour by calling [phone].   DIET:  We do recommend a small meal at first, but then you may proceed to your regular diet.  Drink plenty of  fluids but you should avoid alcoholic beverages for 24 hours.  ACTIVITY:  You should plan to take it easy for the rest of today and you should NOT DRIVE or use heavy machinery until tomorrow (because of the sedation medicines used during the test).    FOLLOW UP: Our staff will call the number listed on your records the next business day following your procedure to check on you and address any questions or concerns that you may have regarding the information given to you following your procedure. If we do not reach you, we will leave a message.  However, if you are feeling well and you are not experiencing any problems, there is no need to return our call.  We will assume that you have returned to your regular daily activities without incident.  If any biopsies were taken you will be contacted by phone or by letter within the next 1-3 weeks.  Please call us at [phone] if you have not heard about the biopsies in 3 weeks.    SIGNATURES/CONFIDENTIALITY: You and/or your care partner have signed paperwork which will be entered into your electronic medical record.  These signatures attest to the fact that that the information above on your After Visit Summary has been reviewed and is understood.  Full responsibility of the confidentiality of this discharge information lies with you and/or your care-partner. 

## 2017-07-09 NOTE — Progress Notes (Signed)
Pt's states no medical or surgical changes since previsit or office visit. 

## 2017-07-10 ENCOUNTER — Telehealth: Payer: Self-pay

## 2017-07-10 NOTE — Telephone Encounter (Signed)
  Follow up Call-  Call back number 07/09/2017  Post procedure Call Back phone  # [phone]  Permission to leave phone message Yes  Some recent data might be hidden     Left message

## 2017-07-12 ENCOUNTER — Encounter: Payer: Self-pay | Admitting: Internal Medicine

## 2017-07-16 ENCOUNTER — Other Ambulatory Visit (INDEPENDENT_AMBULATORY_CARE_PROVIDER_SITE_OTHER): Payer: 59

## 2017-07-16 DIAGNOSIS — I10 Essential (primary) hypertension: Secondary | ICD-10-CM

## 2017-07-16 DIAGNOSIS — R7302 Impaired glucose tolerance (oral): Secondary | ICD-10-CM | POA: Diagnosis not present

## 2017-07-16 LAB — HEPATIC FUNCTION PANEL
ALBUMIN: 4.3 g/dL (ref 3.5–5.2)
ALK PHOS: 90 U/L (ref 39–117)
ALT: 13 U/L (ref 0–53)
AST: 12 U/L (ref 0–37)
BILIRUBIN DIRECT: 0.1 mg/dL (ref 0.0–0.3)
TOTAL PROTEIN: 7.4 g/dL (ref 6.0–8.3)
Total Bilirubin: 0.3 mg/dL (ref 0.2–1.2)

## 2017-07-16 LAB — BASIC METABOLIC PANEL
BUN: 32 mg/dL — ABNORMAL HIGH (ref 6–23)
CHLORIDE: 106 meq/L (ref 96–112)
CO2: 27 meq/L (ref 19–32)
CREATININE: 1.55 mg/dL — AB (ref 0.40–1.50)
Calcium: 9.3 mg/dL (ref 8.4–10.5)
GFR: 48.36 mL/min — ABNORMAL LOW (ref >60.00)
GLUCOSE: 85 mg/dL (ref 70–99)
Potassium: 4.9 mEq/L (ref 3.5–5.1)
Sodium: 138 mEq/L (ref 135–145)

## 2017-07-16 LAB — LIPID PANEL
CHOL/HDL RATIO: 2
CHOLESTEROL: 127 mg/dL (ref 0–200)
HDL: 56.2 mg/dL (ref >39.00)
LDL Cholesterol: 62 mg/dL (ref 0–99)
NONHDL: 70.71
Triglycerides: 46 mg/dL (ref 0.0–149.0)
VLDL: 9.2 mg/dL (ref 0.0–40.0)

## 2017-07-16 LAB — HEMOGLOBIN A1C: Hgb A1c MFr Bld: 5.6 % (ref 4.6–6.5)

## 2017-07-18 DIAGNOSIS — H40053 Ocular hypertension, bilateral: Secondary | ICD-10-CM | POA: Diagnosis not present

## 2017-07-18 DIAGNOSIS — M79674 Pain in right toe(s): Secondary | ICD-10-CM | POA: Diagnosis not present

## 2017-07-18 DIAGNOSIS — B351 Tinea unguium: Secondary | ICD-10-CM | POA: Diagnosis not present

## 2017-07-18 DIAGNOSIS — M79675 Pain in left toe(s): Secondary | ICD-10-CM | POA: Diagnosis not present

## 2017-07-20 ENCOUNTER — Encounter: Payer: Self-pay | Admitting: Internal Medicine

## 2017-07-20 ENCOUNTER — Ambulatory Visit (INDEPENDENT_AMBULATORY_CARE_PROVIDER_SITE_OTHER): Payer: 59 | Admitting: Internal Medicine

## 2017-07-20 VITALS — BP 142/86 | HR 60 | Ht 73.0 in | Wt 274.0 lb

## 2017-07-20 DIAGNOSIS — Z0001 Encounter for general adult medical examination with abnormal findings: Secondary | ICD-10-CM | POA: Diagnosis not present

## 2017-07-20 DIAGNOSIS — I10 Essential (primary) hypertension: Secondary | ICD-10-CM

## 2017-07-20 DIAGNOSIS — E7800 Pure hypercholesterolemia, unspecified: Secondary | ICD-10-CM | POA: Diagnosis not present

## 2017-07-20 DIAGNOSIS — R7302 Impaired glucose tolerance (oral): Secondary | ICD-10-CM

## 2017-07-20 MED ORDER — ATORVASTATIN CALCIUM 20 MG PO TABS: ORAL_TABLET | ORAL | 3 refills | Status: DC

## 2017-07-20 MED ORDER — AMLODIPINE BESYLATE 2.5 MG PO TABS: 2.5000 mg | ORAL_TABLET | Freq: Every day | ORAL | 3 refills | Status: DC

## 2017-07-20 NOTE — Progress Notes (Signed)
Subjective:    Patient ID: Henry Ellis, male    DOB: 1956/08/19, 61 y.o.   MRN: [id number]  HPI  Here to f/u; overall doing ok,  Pt denies chest pain, increasing sob or doe, wheezing, orthopnea, PND, increased LE swelling, palpitations, dizziness or syncope.  Pt denies new neurological symptoms such as new headache, or facial or extremity weakness or numbness.  Pt denies polydipsia, polyuria, or low sugar episode.  Pt states overall good compliance with meds, mostly trying to follow appropriate diet, with wt overall stable, and is now former smoker Wt Readings from Last 3 Encounters:  07/20/17 274 lb (124.3 kg)  07/09/17 277 lb (125.6 kg)  06/25/17 277 lb 3.2 oz (125.7 kg)  Had optho and podiatry eval recently, also for renal and urology f/u planned to 1 yr now.  No new compliants,  BP at home is < 140/90 Past Medical History:  Diagnosis Date  . Anemia   . Anxiety   . Arthritis   . Barrett's esophagus   . Cancer ([place]) 10/17/2016   kidney cancer  . Carpal tunnel syndrome, right 04/21/2011  . Chronic bronchitis   . ED (erectile dysfunction)   . Esophageal reflux   . Esophageal stricture   . Essential hypertension 10/19/2015  . H/O adenomatous polyp of colon 06/05/2012  . Hiatal hernia   . High cholesterol   . History of Bell's palsy   . IBS (irritable bowel syndrome)   . Personal history of colonic polyps 04/15/2008 & 04/22/2012   TUBULAR ADENOMA  . Ulcerative esophagitis    Past Surgical History:  Procedure Laterality Date  . CARPAL TUNNEL RELEASE     left  . INGUINAL HERNIA REPAIR     bilateral  . KNEE ARTHROSCOPY     right [name]  . ROBOT ASSISTED LAPAROSCOPIC NEPHRECTOMY Left 10/18/2016   Procedure: XI ROBOTIC ASSISTED LAPAROSCOPIC RADICAL NEPHRECTOMY;  Surgeon: Alexis Frock, MD;  Location: [facility];  Service: Urology;  Laterality: Left;    reports that he quit smoking about 9 months ago. His smoking use included Cigarettes. He smoked 1.00 pack per day. He has never  used smokeless tobacco. He reports that he drinks about 1.2 oz of alcohol per week . He reports that he uses drugs, including Marijuana, about 2 times per week. family history includes Heart disease in his father; Hypertension in his brother; Lung cancer in his father. Allergies  Allergen Reactions  . Bee Venom Anaphylaxis  . Aspirin Nausea Only  . Amoxicillin Rash    Did PCN reaction causing immediate rash, facial/tongue/throat swelling, SOB or lightheadedness with hypotension: no Did PCN reaction causing severe rash involving mucus membranes or skin necrosis: no Did PCN reaction that required hospitalization : no Did pcn reaction occurring within the last 10 years: no If all of the above answers are "NO", then may proceed with Cephalosporin use. Pt states he can take low doses of amoxicillin, has taken for dental procedures without reaction   Current Outpatient Prescriptions on File Prior to Visit  Medication Sig Dispense Refill  . docusate sodium (COLACE) 100 MG capsule Take 100 mg by mouth daily.    . ferrous sulfate 325 (65 FE) MG tablet Take 325 mg by mouth daily with breakfast.    . omeprazole (PRILOSEC OTC) 20 MG tablet Take 20 mg by mouth daily.    . Psyllium (METAMUCIL) 30.9 % POWD Take 1 packet by mouth daily.     Marland Kitchen terbinafine (LAMISIL) 250 MG tablet  Take 250 mg by mouth daily.     No current facility-administered medications on file prior to visit.    Review of Systems  Constitutional: Negative for other unusual diaphoresis or sweats HENT: Negative for ear discharge or swelling Eyes: Negative for other worsening visual disturbances Respiratory: Negative for stridor or other swelling  Gastrointestinal: Negative for worsening distension or other blood Genitourinary: Negative for retention or other urinary change Musculoskeletal: Negative for other MSK pain or swelling Skin: Negative for color change or other new lesions Neurological: Negative for worsening tremors and  other numbness  Psychiatric/Behavioral: Negative for worsening agitation or other fatigue All other system neg per pt    Objective:   Physical Exam BP (!) 142/86   Pulse 60   Ht 6\' 1"  (1.854 m)   Wt 274 lb (124.3 kg)   SpO2 100%   BMI 36.15 kg/m  VS noted,  Constitutional: Pt appears in NAD HENT: Head: NCAT.  Right Ear: External ear normal.  Left Ear: External ear normal.  Eyes: . Pupils are equal, round, and reactive to light. Conjunctivae and EOM are normal Nose: without d/c or deformity Neck: Neck supple. Gross normal ROM Cardiovascular: Normal rate and regular rhythm.   Pulmonary/Chest: Effort normal and breath sounds without rales or wheezing.  Abd:  Soft, NT, ND, + BS, no organomegaly Neurological: Pt is alert. At baseline orientation, motor grossly intact Skin: Skin is warm. No rashes, other new lesions, no LE edema Psychiatric: Pt behavior is normal without agitation  No other exam findings  Lab Results  Component Value Date   WBC 6.1 01/18/2017   HGB 12.1 (L) 01/18/2017   HCT 35.7 (L) 01/18/2017   PLT 256.0 01/18/2017   GLUCOSE 85 07/16/2017   CHOL 127 07/16/2017   TRIG 46.0 07/16/2017   HDL 56.20 07/16/2017   LDLDIRECT 151.7 03/25/2010   LDLCALC 62 07/16/2017   ALT 13 07/16/2017   AST 12 07/16/2017   NA 138 07/16/2017   K 4.9 07/16/2017   CL 106 07/16/2017   CREATININE 1.55 (H) 07/16/2017   BUN 32 (H) 07/16/2017   CO2 27 07/16/2017   TSH 0.66 01/18/2017   PSA 3.02 01/18/2017   HGBA1C 5.6 07/16/2017   MICROALBUR 1.5 04/21/2016  \    Assessment & Plan:

## 2017-07-20 NOTE — Patient Instructions (Signed)
Please continue all other medications as before, and refills have been done if requested.  Please have the pharmacy call with any other refills you may need.  Please continue your efforts at being more active, low cholesterol diet, and weight control.  You are otherwise up to date with prevention measures today.  Please keep your appointments with your specialists as you may have planned  Please return in 6 months, or sooner if needed, with Lab testing done 3-5 days before  

## 2017-07-22 DIAGNOSIS — Z905 Acquired absence of kidney: Secondary | ICD-10-CM | POA: Insufficient documentation

## 2017-07-22 NOTE — Assessment & Plan Note (Signed)
stable overall by history and exam, recent data reviewed with pt, and pt to continue medical treatment as before,  to f/u any worsening symptoms or concerns Lab Results  Component Value Date   HGBA1C 5.6 07/16/2017

## 2017-07-22 NOTE — Assessment & Plan Note (Signed)
Lab Results  Component Value Date   LDLCALC 62 07/16/2017  stable overall by history and exam, recent data reviewed with pt, and pt to continue medical treatment as before,  to f/u any worsening symptoms or concerns

## 2017-07-22 NOTE — Assessment & Plan Note (Signed)
stable overall by history and exam, recent data reviewed with pt, and pt to continue medical treatment as before,  to f/u any worsening symptoms or concerns BP Readings from Last 3 Encounters:  07/20/17 (!) 142/86  07/09/17 (!) 145/78  01/18/17 140/80

## 2017-08-24 ENCOUNTER — Ambulatory Visit (INDEPENDENT_AMBULATORY_CARE_PROVIDER_SITE_OTHER)
Admission: RE | Admit: 2017-08-24 | Discharge: 2017-08-24 | Disposition: A | Discharge status: Home / Self Care | Payer: 59 | Source: Ambulatory Visit | Attending: Acute Care | Admitting: Acute Care

## 2017-08-24 ENCOUNTER — Other Ambulatory Visit: Payer: Self-pay | Admitting: Acute Care

## 2017-08-24 DIAGNOSIS — Z87891 Personal history of nicotine dependence: Secondary | ICD-10-CM | POA: Diagnosis not present

## 2017-08-24 DIAGNOSIS — F17210 Nicotine dependence, cigarettes, uncomplicated: Secondary | ICD-10-CM

## 2017-08-24 DIAGNOSIS — Z122 Encounter for screening for malignant neoplasm of respiratory organs: Secondary | ICD-10-CM

## 2017-09-24 ENCOUNTER — Other Ambulatory Visit: Payer: Self-pay | Admitting: Internal Medicine

## 2018-01-18 ENCOUNTER — Other Ambulatory Visit (INDEPENDENT_AMBULATORY_CARE_PROVIDER_SITE_OTHER): Payer: 59

## 2018-01-18 DIAGNOSIS — Z0001 Encounter for general adult medical examination with abnormal findings: Secondary | ICD-10-CM | POA: Diagnosis not present

## 2018-01-18 DIAGNOSIS — R7302 Impaired glucose tolerance (oral): Secondary | ICD-10-CM | POA: Diagnosis not present

## 2018-01-18 LAB — LIPID PANEL
CHOL/HDL RATIO: 2
Cholesterol: 124 mg/dL (ref 0–200)
HDL: 54 mg/dL (ref >39.00)
LDL CALC: 56 mg/dL (ref 0–99)
NonHDL: 70.08
TRIGLYCERIDES: 70 mg/dL (ref 0.0–149.0)
VLDL: 14 mg/dL (ref 0.0–40.0)

## 2018-01-18 LAB — CBC WITH DIFFERENTIAL/PLATELET
Basophils Absolute: 0.1 10*3/uL (ref 0.0–0.1)
Basophils Relative: 1 % (ref 0.0–3.0)
EOS PCT: 5.2 % — AB (ref 0.0–5.0)
Eosinophils Absolute: 0.3 10*3/uL (ref 0.0–0.7)
HCT: 36 % — ABNORMAL LOW (ref 39.0–52.0)
Hemoglobin: 12.3 g/dL — ABNORMAL LOW (ref 13.0–17.0)
LYMPHS ABS: 1.1 10*3/uL (ref 0.7–4.0)
Lymphocytes Relative: 19.4 % (ref 12.0–46.0)
MCHC: 34.1 g/dL (ref 30.0–36.0)
MCV: 93.9 fl (ref 78.0–100.0)
MONO ABS: 0.7 10*3/uL (ref 0.1–1.0)
MONOS PCT: 11.3 % (ref 3.0–12.0)
NEUTROS ABS: 3.7 10*3/uL (ref 1.4–7.7)
NEUTROS PCT: 63.1 % (ref 43.0–77.0)
PLATELETS: 234 10*3/uL (ref 150.0–400.0)
RBC: 3.83 Mil/uL — ABNORMAL LOW (ref 4.22–5.81)
RDW: 14.5 % (ref 11.5–15.5)
WBC: 5.9 10*3/uL (ref 4.0–10.5)

## 2018-01-18 LAB — URINALYSIS, ROUTINE W REFLEX MICROSCOPIC
BILIRUBIN URINE: NEGATIVE
Hgb urine dipstick: NEGATIVE
Ketones, ur: NEGATIVE
LEUKOCYTES UA: NEGATIVE
Nitrite: NEGATIVE
RBC / HPF: NONE SEEN (ref 0–?)
Specific Gravity, Urine: 1.015 (ref 1.000–1.030)
Total Protein, Urine: NEGATIVE
URINE GLUCOSE: NEGATIVE
UROBILINOGEN UA: 0.2 (ref 0.0–1.0)
WBC UA: NONE SEEN (ref 0–?)
pH: 6 (ref 5.0–8.0)

## 2018-01-18 LAB — PSA: PSA: 4.31 ng/mL — ABNORMAL HIGH (ref 0.10–4.00)

## 2018-01-18 LAB — BASIC METABOLIC PANEL
BUN: 29 mg/dL — ABNORMAL HIGH (ref 6–23)
CHLORIDE: 106 meq/L (ref 96–112)
CO2: 29 meq/L (ref 19–32)
Calcium: 9 mg/dL (ref 8.4–10.5)
Creatinine, Ser: 1.69 mg/dL — ABNORMAL HIGH (ref 0.40–1.50)
GFR: 43.69 mL/min — ABNORMAL LOW (ref >60.00)
Glucose, Bld: 111 mg/dL — ABNORMAL HIGH (ref 70–99)
Potassium: 4.4 mEq/L (ref 3.5–5.1)
SODIUM: 140 meq/L (ref 135–145)

## 2018-01-18 LAB — HEMOGLOBIN A1C: Hgb A1c MFr Bld: 6 % (ref 4.6–6.5)

## 2018-01-18 LAB — HEPATIC FUNCTION PANEL
ALBUMIN: 4.3 g/dL (ref 3.5–5.2)
ALT: 18 U/L (ref 0–53)
AST: 16 U/L (ref 0–37)
Alkaline Phosphatase: 89 U/L (ref 39–117)
Bilirubin, Direct: 0.1 mg/dL (ref 0.0–0.3)
Total Bilirubin: 0.4 mg/dL (ref 0.2–1.2)
Total Protein: 7.4 g/dL (ref 6.0–8.3)

## 2018-01-18 LAB — TSH: TSH: 0.75 u[IU]/mL (ref 0.35–4.50)

## 2018-01-25 ENCOUNTER — Ambulatory Visit: Payer: 59 | Admitting: Internal Medicine

## 2018-01-25 ENCOUNTER — Encounter: Payer: Self-pay | Admitting: Internal Medicine

## 2018-01-25 VITALS — BP 140/88 | HR 70 | Temp 97.8°F | Ht 73.0 in | Wt 278.0 lb

## 2018-01-25 DIAGNOSIS — M1711 Unilateral primary osteoarthritis, right knee: Secondary | ICD-10-CM

## 2018-01-25 DIAGNOSIS — Z0001 Encounter for general adult medical examination with abnormal findings: Secondary | ICD-10-CM | POA: Diagnosis not present

## 2018-01-25 DIAGNOSIS — R7302 Impaired glucose tolerance (oral): Secondary | ICD-10-CM

## 2018-01-25 DIAGNOSIS — D509 Iron deficiency anemia, unspecified: Secondary | ICD-10-CM

## 2018-01-25 DIAGNOSIS — Z23 Encounter for immunization: Secondary | ICD-10-CM

## 2018-01-25 DIAGNOSIS — I10 Essential (primary) hypertension: Secondary | ICD-10-CM

## 2018-01-25 DIAGNOSIS — E559 Vitamin D deficiency, unspecified: Secondary | ICD-10-CM | POA: Diagnosis not present

## 2018-01-25 DIAGNOSIS — K219 Gastro-esophageal reflux disease without esophagitis: Secondary | ICD-10-CM

## 2018-01-25 DIAGNOSIS — Z114 Encounter for screening for human immunodeficiency virus [HIV]: Secondary | ICD-10-CM | POA: Diagnosis not present

## 2018-01-25 DIAGNOSIS — R350 Frequency of micturition: Secondary | ICD-10-CM | POA: Diagnosis not present

## 2018-01-25 DIAGNOSIS — R972 Elevated prostate specific antigen [PSA]: Secondary | ICD-10-CM

## 2018-01-25 MED ORDER — TAMSULOSIN HCL 0.4 MG PO CAPS: 0.4000 mg | ORAL_CAPSULE | Freq: Every day | ORAL | 3 refills | Status: DC

## 2018-01-25 MED ORDER — DOXYCYCLINE HYCLATE 100 MG PO TABS: 100.0000 mg | ORAL_TABLET | Freq: Two times a day (BID) | ORAL | 0 refills | Status: DC

## 2018-01-25 MED ORDER — ATORVASTATIN CALCIUM 20 MG PO TABS: ORAL_TABLET | ORAL | 3 refills | Status: DC

## 2018-01-25 MED ORDER — AMLODIPINE BESYLATE 2.5 MG PO TABS: 2.5000 mg | ORAL_TABLET | Freq: Every day | ORAL | 3 refills | Status: DC

## 2018-01-25 NOTE — Assessment & Plan Note (Signed)
Likely bph related, for trial flomax, f/u urology if not improved

## 2018-01-25 NOTE — Progress Notes (Signed)
Subjective:    Patient ID: Henry Ellis, male    DOB: 01-29-1956, 62 y.o.   MRN: [id number]  HPI  Here for wellness and f/u;  Overall doing ok;  Pt denies Chest pain, worsening SOB, DOE, wheezing, orthopnea, PND, worsening LE edema, palpitations, dizziness or syncope.  Pt denies neurological change such as new headache, facial or extremity weakness.  Pt denies polydipsia, polyuria, or low sugar symptoms. Pt states overall good compliance with treatment and medications, good tolerability, and has been trying to follow appropriate diet.  Pt denies worsening depressive symptoms, suicidal ideation or panic. No fever, night sweats, wt loss, loss of appetite, or other constitutional symptoms.  Pt states good ability with ADL's, has low fall risk, home safety reviewed and adequate, no other significant changes in hearing or vision, and only occasionally active with exercise.  Conts to see [facility] for solitary kidney s/p left nephrectomy per Dr manny/urology.  Does also have slow urinary flow and frequency with nocturia worse x 6 mo, Denies urinary symptoms such as dysuria, urgency, flank pain, hematuria or n/v, fever, chills.  Does also have bilat knee pain chronic, hobbles to get around, no prior evaluation and declines for now.   Past Medical History:  Diagnosis Date  . Anemia   . Anxiety   . Arthritis   . Barrett's esophagus   . Cancer ([place]) 10/17/2016   kidney cancer  . Carpal tunnel syndrome, right 04/21/2011  . Chronic bronchitis   . ED (erectile dysfunction)   . Esophageal reflux   . Esophageal stricture   . Essential hypertension 10/19/2015  . H/O adenomatous polyp of colon 06/05/2012  . Hiatal hernia   . High cholesterol   . History of Bell's palsy   . IBS (irritable bowel syndrome)   . Personal history of colonic polyps 04/15/2008 & 04/22/2012   TUBULAR ADENOMA  . Ulcerative esophagitis    Past Surgical History:  Procedure Laterality Date  . CARPAL TUNNEL RELEASE     left  .  INGUINAL HERNIA REPAIR     bilateral  . KNEE ARTHROSCOPY     right [name]  . ROBOT ASSISTED LAPAROSCOPIC NEPHRECTOMY Left 10/18/2016   Procedure: XI ROBOTIC ASSISTED LAPAROSCOPIC RADICAL NEPHRECTOMY;  Surgeon: Alexis Frock, MD;  Location: [facility];  Service: Urology;  Laterality: Left;    reports that he quit smoking about 15 months ago. His smoking use included cigarettes. He smoked 1.00 pack per day. he has never used smokeless tobacco. He reports that he drinks about 1.2 oz of alcohol per week. He reports that he uses drugs. Drug: Marijuana. Frequency: 2.00 times per week. family history includes Heart disease in his father; Hypertension in his brother; Lung cancer in his father. meds - see med list  Review of Systems Constitutional: Negative for other unusual diaphoresis, sweats, appetite or weight changes HENT: Negative for other worsening hearing loss, ear pain, facial swelling, mouth sores or neck stiffness.   Eyes: Negative for other worsening pain, redness or other visual disturbance.  Respiratory: Negative for other stridor or swelling Cardiovascular: Negative for other palpitations or other chest pain  Gastrointestinal: Negative for worsening diarrhea or loose stools, blood in stool, distention or other pain Genitourinary: Negative for hematuria, flank pain or other change in urine volume.  Musculoskeletal: Negative for myalgias or other joint swelling.  Skin: Negative for other color change, or other wound or worsening drainage.  Neurological: Negative for other syncope or numbness. Hematological: Negative for other adenopathy  or swelling Psychiatric/Behavioral: Negative for hallucinations, other worsening agitation, SI, self-injury, or new decreased concentration All other system neg per pt    Objective:   Physical Exam BP 140/88   Pulse 70   Temp 97.8 F (36.6 C) (Oral)   Ht 6\' 1"  (1.854 m)   Wt 278 lb (126.1 kg)   SpO2 96%   BMI 36.68 kg/m  VS noted,    Constitutional: Pt is oriented to person, place, and time. Appears well-developed and well-nourished, in no significant distress and comfortable Head: Normocephalic and atraumatic  Eyes: Conjunctivae and EOM are normal. Pupils are equal, round, and reactive to light Right Ear: External ear normal without discharge Left Ear: External ear normal without discharge Nose: Nose without discharge or deformity Mouth/Throat: Oropharynx is without other ulcerations and moist  Neck: Normal range of motion. Neck supple. No JVD present. No tracheal deviation present or significant neck LA or mass Cardiovascular: Normal rate, regular rhythm, normal heart sounds and intact distal pulses.   Pulmonary/Chest: WOB normal and breath sounds without rales or wheezing  Abdominal: Soft. Bowel sounds are normal. NT. No HSM  Musculoskeletal: Normal range of motion. Exhibits no edema except bilat kneew with marked degenerative changes Lymphadenopathy: Has no other cervical adenopathy.  Neurological: Pt is alert and oriented to person, place, and time. Pt has normal reflexes. No cranial nerve deficit. Motor grossly intact, Gait intact Skin: Skin is warm and dry. No rash noted or new ulcerations Psychiatric:  Has normal mood and affect. Behavior is normal without agitation No other exam findings    Assessment & Plan:

## 2018-01-25 NOTE — Assessment & Plan Note (Signed)
stable overall by history and exam, recent data reviewed with pt, and pt to continue medical treatment as before,  to f/u any worsening symptoms or concerns BP Readings from Last 3 Encounters:  01/25/18 140/88  07/20/17 (!) 142/86  07/09/17 (!) 145/78

## 2018-01-25 NOTE — Assessment & Plan Note (Signed)
stable overall by history and exam, recent data reviewed with pt, and pt to continue medical treatment as before,  to f/u any worsening symptoms or concerns Lab Results  Component Value Date   HGBA1C 6.0 01/18/2018

## 2018-01-25 NOTE — Patient Instructions (Addendum)
Please take all new medication as prescribed - the antibiotic, and the flomax (both sent to [facility])  Please return in 4 weeks for a repeat PSA (and iron levels)  OK to increase the zantac to 300 mg per day  Please continue all other medications as before, and refills have been done if requested.  Please have the pharmacy call with any other refills you may need.  Please continue your efforts at being more active, low cholesterol diet, and weight control.  You are otherwise up to date with prevention measures today.  Please keep your appointments with your specialists as you may have planned  Please call if you change your mind about referral to orthopedics for the knees  Please return in 6 months, or sooner if needed

## 2018-01-25 NOTE — Assessment & Plan Note (Signed)
Moderate, declines ortho or sports med referrals

## 2018-01-25 NOTE — Assessment & Plan Note (Signed)
Also for iron f/u,  to f/u any worsening symptoms or concerns

## 2018-01-25 NOTE — Assessment & Plan Note (Addendum)
Mild, o/w asympt, for doxy course x 4 wks, f/u PSA after, may need urology if elev psa persists  In addition to the time spent performing CPE, I spent an additional 25 minutes face to face,in which greater than 50% of this time was spent in counseling and coordination of care for patient's acute illness as documented, including the differential dx, treatment, further evaluation and other management of elevated PSA, urinary freq, hyperglycemia, GERD. Iron deficiency, HTN, OA knees

## 2018-01-25 NOTE — Assessment & Plan Note (Addendum)
Mild uncontrolled, for increased zantac 300 qD, stable overall by history and exam, and pt to continue medical treatment as before,  to f/u any worsening symptoms or concerns

## 2018-02-04 DIAGNOSIS — N183 Chronic kidney disease, stage 3 (moderate): Secondary | ICD-10-CM | POA: Diagnosis not present

## 2018-02-18 ENCOUNTER — Other Ambulatory Visit: Payer: Self-pay | Admitting: Internal Medicine

## 2018-02-18 ENCOUNTER — Other Ambulatory Visit (INDEPENDENT_AMBULATORY_CARE_PROVIDER_SITE_OTHER): Payer: 59

## 2018-02-18 DIAGNOSIS — R972 Elevated prostate specific antigen [PSA]: Secondary | ICD-10-CM

## 2018-02-18 DIAGNOSIS — D509 Iron deficiency anemia, unspecified: Secondary | ICD-10-CM | POA: Diagnosis not present

## 2018-02-18 DIAGNOSIS — N183 Chronic kidney disease, stage 3 (moderate): Secondary | ICD-10-CM | POA: Diagnosis not present

## 2018-02-18 DIAGNOSIS — Z114 Encounter for screening for human immunodeficiency virus [HIV]: Secondary | ICD-10-CM

## 2018-02-18 DIAGNOSIS — E559 Vitamin D deficiency, unspecified: Secondary | ICD-10-CM | POA: Diagnosis not present

## 2018-02-18 DIAGNOSIS — E785 Hyperlipidemia, unspecified: Secondary | ICD-10-CM | POA: Diagnosis not present

## 2018-02-18 DIAGNOSIS — I10 Essential (primary) hypertension: Secondary | ICD-10-CM | POA: Diagnosis not present

## 2018-02-18 LAB — IBC PANEL
Iron: 76 ug/dL (ref 42–165)
Saturation Ratios: 29 % (ref 20.0–50.0)
Transferrin: 187 mg/dL — ABNORMAL LOW (ref 212.0–360.0)

## 2018-02-18 LAB — VITAMIN D 25 HYDROXY (VIT D DEFICIENCY, FRACTURES): VITD: 27.12 ng/mL — ABNORMAL LOW (ref 30.00–100.00)

## 2018-02-18 LAB — PSA: PSA: 4.79 ng/mL — ABNORMAL HIGH (ref 0.10–4.00)

## 2018-02-18 MED ORDER — VITAMIN D (ERGOCALCIFEROL) 1.25 MG (50000 UNIT) PO CAPS: 50000.0000 [IU] | ORAL_CAPSULE | ORAL | 0 refills | Status: DC

## 2018-02-19 ENCOUNTER — Telehealth: Payer: Self-pay

## 2018-02-19 ENCOUNTER — Telehealth: Payer: Self-pay | Admitting: Internal Medicine

## 2018-02-19 LAB — HIV ANTIBODY (ROUTINE TESTING W REFLEX): HIV 1&2 Ab, 4th Generation: NONREACTIVE

## 2018-02-19 NOTE — Telephone Encounter (Signed)
Called pt, LVM.   CRM created.  

## 2018-02-19 NOTE — Telephone Encounter (Signed)
Copied from [place] [id number]. Topic: General - Other >> Feb 19, 2018  1:57 PM Juliet Rude, CMA wrote: Reason for CRM: to discuss lab results, ok for [facility] to inform  Pt returned call about labs, he would like call back by 530 please

## 2018-02-19 NOTE — Telephone Encounter (Signed)
-----   Message from Biagio Borg, MD sent at 02/18/2018  5:24 PM EST ----- Left message on MyChart, pt to cont same tx  Except  The test results show that your current treatment is OK, except the Vit D level is low, and the PSA is mild increased.  Please take a prescription for Vit D at 50,000 units per day for 12 weeks, then take OTC Vit D after that at 2000 units per day.  We should also refer you back to Dr Tresa Moore for the elevated PSA.   [place] to please inform pt, I will do referral and rx

## 2018-02-20 NOTE — Telephone Encounter (Signed)
Left VM to return call 

## 2018-03-07 ENCOUNTER — Ambulatory Visit (INDEPENDENT_AMBULATORY_CARE_PROVIDER_SITE_OTHER)
Admission: RE | Admit: 2018-03-07 | Discharge: 2018-03-07 | Disposition: A | Discharge status: Home / Self Care | Payer: 59 | Source: Ambulatory Visit | Attending: Family Medicine | Admitting: Family Medicine

## 2018-03-07 ENCOUNTER — Ambulatory Visit: Payer: 59 | Admitting: Family Medicine

## 2018-03-07 ENCOUNTER — Encounter: Payer: Self-pay | Admitting: Family Medicine

## 2018-03-07 VITALS — BP 142/84 | HR 62 | Temp 98.7°F | Ht 73.0 in | Wt 279.0 lb

## 2018-03-07 DIAGNOSIS — M25511 Pain in right shoulder: Secondary | ICD-10-CM

## 2018-03-07 DIAGNOSIS — S4991XA Unspecified injury of right shoulder and upper arm, initial encounter: Secondary | ICD-10-CM | POA: Diagnosis not present

## 2018-03-07 NOTE — Progress Notes (Signed)
Henry Ellis - 62 y.o. male MRN [id number]  Date of birth: 06-09-1956  SUBJECTIVE:  Including CC & ROS.  Chief Complaint  Patient presents with  . Right shoulder pain    Henry Ellis is a 62 y.o. male that is presenting with right shoulder pain.  He fell yesterday at work when he tripped on a pallet. He is not sure if he landed on his arm. He noticed afterwards he could not lift his arm. Flexion and extension trigger severe pain. Denies tingling or numbness. Pain is diffusely located throughout his shoulder.     Review of Systems  Constitutional: Negative for fever.  HENT: Negative for congestion.   Respiratory: Negative for cough.   Cardiovascular: Negative for chest pain.  Gastrointestinal: Negative for abdominal pain.  Musculoskeletal: Negative for back pain.  Skin: Negative for color change.  Allergic/Immunologic: Negative for immunocompromised state.  Neurological: Positive for weakness.  Hematological: Negative for adenopathy.  Psychiatric/Behavioral: Negative for agitation.    HISTORY: Past Medical, Surgical, Social, and Family History Reviewed & Updated per EMR.   Pertinent Historical Findings include:  Past Medical History:  Diagnosis Date  . Anemia   . Anxiety   . Arthritis   . Barrett's esophagus   . Cancer ([place]) 10/17/2016   kidney cancer  . Carpal tunnel syndrome, right 04/21/2011  . Chronic bronchitis   . ED (erectile dysfunction)   . Esophageal reflux   . Esophageal stricture   . Essential hypertension 10/19/2015  . H/O adenomatous polyp of colon 06/05/2012  . Hiatal hernia   . High cholesterol   . History of Bell's palsy   . IBS (irritable bowel syndrome)   . Personal history of colonic polyps 04/15/2008 & 04/22/2012   TUBULAR ADENOMA  . Ulcerative esophagitis     Past Surgical History:  Procedure Laterality Date  . CARPAL TUNNEL RELEASE     left  . INGUINAL HERNIA REPAIR     bilateral  . KNEE ARTHROSCOPY     right [name]  . ROBOT ASSISTED  LAPAROSCOPIC NEPHRECTOMY Left 10/18/2016   Procedure: XI ROBOTIC ASSISTED LAPAROSCOPIC RADICAL NEPHRECTOMY;  Surgeon: Alexis Frock, MD;  Location: [facility];  Service: Urology;  Laterality: Left;    Allergies  Allergen Reactions  . Bee Venom Anaphylaxis  . Aspirin Nausea Only  . Amoxicillin Rash    Did PCN reaction causing immediate rash, facial/tongue/throat swelling, SOB or lightheadedness with hypotension: no Did PCN reaction causing severe rash involving mucus membranes or skin necrosis: no Did PCN reaction that required hospitalization : no Did pcn reaction occurring within the last 10 years: no If all of the above answers are "NO", then may proceed with Cephalosporin use. Pt states he can take low doses of amoxicillin, has taken for dental procedures without reaction    Family History  Problem Relation Age of Onset  . Heart disease Father   . Lung cancer Father   . Hypertension Brother        THROUGHOUT FAMILY  . Colon cancer Neg Hx   . Stomach cancer Neg Hx   . Esophageal cancer Neg Hx   . Rectal cancer Neg Hx      Social History   Socioeconomic History  . Marital status: Single    Spouse name: Not on file  . Number of children: Not on file  . Years of education: Not on file  . Highest education level: Not on file  Occupational History  . Occupation: Dealer  Employer: [employer]  Social Needs  . Financial resource strain: Not on file  . Food insecurity:    Worry: Not on file    Inability: Not on file  . Transportation needs:    Medical: Not on file    Non-medical: Not on file  Tobacco Use  . Smoking status: Former Smoker    Packs/day: 1.00    Types: Cigarettes    Last attempt to quit: 10/17/2016    Years since quitting: 1.3  . Smokeless tobacco: Never Used  . Tobacco comment: Counseling sheet given 03-2012   Substance and Sexual Activity  . Alcohol use: Yes    Alcohol/week: 1.2 oz    Types: 2 Cans of beer per week  . Drug use: Yes     Frequency: 2.0 times per week    Types: Marijuana    Comment: last use June 05, 2016  . Sexual activity: Not on file  Lifestyle  . Physical activity:    Days per week: Not on file    Minutes per session: Not on file  . Stress: Not on file  Relationships  . Social connections:    Talks on phone: Not on file    Gets together: Not on file    Attends religious service: Not on file    Active member of club or organization: Not on file    Attends meetings of clubs or organizations: Not on file    Relationship status: Not on file  . Intimate partner violence:    Fear of current or ex partner: Not on file    Emotionally abused: Not on file    Physically abused: Not on file    Forced sexual activity: Not on file  Other Topics Concern  . Not on file  Social History Narrative  . Not on file     PHYSICAL EXAM:  VS: BP (!) 142/84 (BP Location: Left Arm, Patient Position: Sitting, Cuff Size: Normal)   Pulse 62   Temp 98.7 F (37.1 C) (Oral)   Ht 6\' 1"  (1.854 m)   Wt 279 lb (126.6 kg)   SpO2 98%   BMI 36.81 kg/m  Physical Exam Gen: NAD, alert, cooperative with exam, well-appearing ENT: normal lips, normal nasal mucosa,  Eye: normal EOM, normal conjunctiva and lids CV:  no edema, +2 pedal pulses   Resp: no accessory muscle use, non-labored,  Skin: no rashes, no areas of induration  Neuro: normal tone, normal sensation to touch Psych:  normal insight, alert and oriented MSK:  Right shoulder:  Unable to active abduct or flex  Passively has full ROM  Has pain with pronation and supination  Able to IR and ER  Normal ER  No swelling or ecchymosis of proximal humerus  Normal grip strength  Neurovascularly intact   Limited ultrasound: right shoulder :  No fracture appreciated Appears to have BT partial rupture with hypoechoic change in the BT sheath  Normal subscap  Supraspinatus appears to have possible partial tear in dynamic testing   Summary: findings suggestive of BT  rupture and supraspinatus partial tear.  Ultrasound and interpretation by Clearance Coots, MD              ASSESSMENT & PLAN:   Right shoulder pain Appears to have a BT tear of the long head. US showed intact supraspinatus but may have a tear given dynamic testing and clinical exam. Injured occurred at work  - referral to ortho.  - xray today  -  work note provided for limited work. Unable to lift with his right shoulder.

## 2018-03-07 NOTE — Patient Instructions (Addendum)
We will call you with the results from today  Please try to ice the shoulder  Please use tylenol or ibuprofen for pain

## 2018-03-08 ENCOUNTER — Ambulatory Visit: Payer: Self-pay | Admitting: *Deleted

## 2018-03-08 NOTE — Telephone Encounter (Signed)
Already  Triaged  See  Duplicate  Encounter

## 2018-03-08 NOTE — Telephone Encounter (Signed)
Pt   Was  Seen  Yesterday  By Dr  Raeford Razor    Had  X  Rays  And   Dr  Raeford Razor    Did  An  Ultrasound in the  Office.  Pt  States   He  Will  Be  Getting a  Referral  To  An orthopedist   By the  Office  But  Has  Not  Heard  Anything yet .  He  Was  Advised  To  Follow   Plan of  Care .  And  Was  Advised  How to  Check  For capillary.   He  Was  Advised   To apply   Ice  To  Area  As  Directed. If  Any  Neurologic symptoms  Or  Loss  Of  Circulation  Issues   Over  Weekend  Go to  Er . If have  Not  Heard  Back  Early next  Week  Call back      Answer Assessment - Initial Assessment Questions 1. ONSET: "When did the pain start?"      After fall   2  Days     2. LOCATION: "Where is the pain located?"         Pain    r  Shoulder     3. PAIN: "How bad is the pain?" (Scale 1-10; or mild, moderate, severe)   - MILD (1-3): doesn't interfere with normal activities   - MODERATE (4-7): interferes with normal activities (e.g., work or school) or awakens from sleep   - SEVERE (8-10): excruciating pain, unable to do any normal activities, unable to move arm at all due to pain       5   4. WORK OR EXERCISE: "Has there been any recent work or exercise that involved this part of the body?"       inj   2  Days  Ago  When  He  [place]  On his  Shoulder    5. CAUSE: "What do you think is causing the shoulder pain?"         Recent   Injury   6. OTHER SYMPTOMS: "Do you have any other symptoms?" (e.g., neck pain, swelling, rash, fever, numbness, weakness)           no 7. PREGNANCY: "Is there any chance you are pregnant?" "When was your last menstrual period?"     n/a  Protocols used: SHOULDER PAIN-A-AH

## 2018-03-09 NOTE — Assessment & Plan Note (Signed)
Appears to have a BT tear of the long head. US showed intact supraspinatus but may have a tear given dynamic testing and clinical exam. Injured occurred at work  - referral to ortho.  - xray today  - work note provided for limited work. Unable to lift with his right shoulder.

## 2018-04-18 DIAGNOSIS — M25511 Pain in right shoulder: Secondary | ICD-10-CM | POA: Diagnosis not present

## 2018-04-23 DIAGNOSIS — M25519 Pain in unspecified shoulder: Secondary | ICD-10-CM | POA: Diagnosis not present

## 2018-04-26 DIAGNOSIS — C642 Malignant neoplasm of left kidney, except renal pelvis: Secondary | ICD-10-CM | POA: Diagnosis not present

## 2018-04-26 DIAGNOSIS — C649 Malignant neoplasm of unspecified kidney, except renal pelvis: Secondary | ICD-10-CM | POA: Diagnosis not present

## 2018-04-26 DIAGNOSIS — Z125 Encounter for screening for malignant neoplasm of prostate: Secondary | ICD-10-CM | POA: Diagnosis not present

## 2018-04-29 ENCOUNTER — Other Ambulatory Visit: Payer: Self-pay | Admitting: Internal Medicine

## 2018-04-29 DIAGNOSIS — C642 Malignant neoplasm of left kidney, except renal pelvis: Secondary | ICD-10-CM | POA: Diagnosis not present

## 2018-04-29 DIAGNOSIS — N183 Chronic kidney disease, stage 3 (moderate): Secondary | ICD-10-CM | POA: Diagnosis not present

## 2018-04-29 DIAGNOSIS — Z905 Acquired absence of kidney: Secondary | ICD-10-CM | POA: Diagnosis not present

## 2018-05-01 DIAGNOSIS — M25519 Pain in unspecified shoulder: Secondary | ICD-10-CM | POA: Diagnosis not present

## 2018-05-07 DIAGNOSIS — M25511 Pain in right shoulder: Secondary | ICD-10-CM | POA: Diagnosis not present

## 2018-05-15 DIAGNOSIS — M25511 Pain in right shoulder: Secondary | ICD-10-CM | POA: Diagnosis not present

## 2018-05-22 DIAGNOSIS — M25511 Pain in right shoulder: Secondary | ICD-10-CM | POA: Diagnosis not present

## 2018-05-29 DIAGNOSIS — M25511 Pain in right shoulder: Secondary | ICD-10-CM | POA: Diagnosis not present

## 2018-06-03 DIAGNOSIS — M25511 Pain in right shoulder: Secondary | ICD-10-CM | POA: Diagnosis not present

## 2018-07-19 ENCOUNTER — Other Ambulatory Visit: Payer: 59

## 2018-07-26 ENCOUNTER — Encounter: Payer: Self-pay | Admitting: Internal Medicine

## 2018-07-26 ENCOUNTER — Ambulatory Visit: Payer: 59 | Admitting: Internal Medicine

## 2018-07-26 VITALS — BP 108/70 | HR 71 | Temp 97.8°F | Resp 16 | Wt 286.0 lb

## 2018-07-26 DIAGNOSIS — I10 Essential (primary) hypertension: Secondary | ICD-10-CM

## 2018-07-26 DIAGNOSIS — N189 Chronic kidney disease, unspecified: Secondary | ICD-10-CM | POA: Insufficient documentation

## 2018-07-26 DIAGNOSIS — N183 Chronic kidney disease, stage 3 unspecified: Secondary | ICD-10-CM

## 2018-07-26 DIAGNOSIS — R972 Elevated prostate specific antigen [PSA]: Secondary | ICD-10-CM

## 2018-07-26 DIAGNOSIS — R7302 Impaired glucose tolerance (oral): Secondary | ICD-10-CM | POA: Diagnosis not present

## 2018-07-26 DIAGNOSIS — E7800 Pure hypercholesterolemia, unspecified: Secondary | ICD-10-CM

## 2018-07-26 DIAGNOSIS — E559 Vitamin D deficiency, unspecified: Secondary | ICD-10-CM

## 2018-07-26 MED ORDER — AMITRIPTYLINE HCL 10 MG PO TABS: 10.0000 mg | ORAL_TABLET | Freq: Every day | ORAL | 1 refills | Status: DC

## 2018-07-26 MED ORDER — AMLODIPINE BESYLATE 2.5 MG PO TABS: 2.5000 mg | ORAL_TABLET | Freq: Every day | ORAL | 3 refills | Status: DC

## 2018-07-26 MED ORDER — ATORVASTATIN CALCIUM 20 MG PO TABS: ORAL_TABLET | ORAL | 3 refills | Status: DC

## 2018-07-26 MED ORDER — TAMSULOSIN HCL 0.4 MG PO CAPS: 0.4000 mg | ORAL_CAPSULE | Freq: Every day | ORAL | 3 refills | Status: DC

## 2018-07-26 NOTE — Assessment & Plan Note (Signed)
stable overall by history and exam, recent data reviewed with pt, and pt to continue medical treatment as before,  to f/u any worsening symptoms or concerns BP Readings from Last 3 Encounters:  07/26/18 108/70  03/07/18 (!) 142/84  01/25/18 140/88

## 2018-07-26 NOTE — Progress Notes (Signed)
Subjective:    Patient ID: Henry Ellis, male    DOB: 24-Aug-1956, 62 y.o.   MRN: [id number]  HPI  Here to f/u; overall doing ok,  Pt denies chest pain, increasing sob or doe, wheezing, orthopnea, PND, increased LE swelling, palpitations, dizziness or syncope.  Pt denies new neurological symptoms such as new headache, or facial or extremity weakness or numbness.  Pt denies polydipsia, polyuria, or low sugar episode.  Pt states overall good compliance with meds, mostly trying to follow appropriate diet, with wt overall stable,  but little exercise however.  Denies urinary symptoms such as dysuria, frequency, urgency, flank pain, hematuria or n/v, fever, chills.  Also with low Vit D at last visit.  No new complaints Past Medical History:  Diagnosis Date  . Anemia   . Anxiety   . Arthritis   . Barrett's esophagus   . Cancer ([place]) 10/17/2016   kidney cancer  . Carpal tunnel syndrome, right 04/21/2011  . Chronic bronchitis   . ED (erectile dysfunction)   . Esophageal reflux   . Esophageal stricture   . Essential hypertension 10/19/2015  . H/O adenomatous polyp of colon 06/05/2012  . Hiatal hernia   . High cholesterol   . History of Bell's palsy   . IBS (irritable bowel syndrome)   . Personal history of colonic polyps 04/15/2008 & 04/22/2012   TUBULAR ADENOMA  . Ulcerative esophagitis    Past Surgical History:  Procedure Laterality Date  . CARPAL TUNNEL RELEASE     left  . INGUINAL HERNIA REPAIR     bilateral  . KNEE ARTHROSCOPY     right [name]  . ROBOT ASSISTED LAPAROSCOPIC NEPHRECTOMY Left 10/18/2016   Procedure: XI ROBOTIC ASSISTED LAPAROSCOPIC RADICAL NEPHRECTOMY;  Surgeon: Alexis Frock, MD;  Location: [facility];  Service: Urology;  Laterality: Left;    reports that he quit smoking about 21 months ago. His smoking use included cigarettes. He smoked 1.00 pack per day. He has never used smokeless tobacco. He reports that he drinks about 2.0 standard drinks of alcohol per week. He  reports that he has current or past drug history. Drug: Marijuana. Frequency: 2.00 times per week. family history includes Heart disease in his father; Hypertension in his brother; Lung cancer in his father. Allergies  Allergen Reactions  . Bee Venom Anaphylaxis  . Aspirin Nausea Only  . Amoxicillin Rash    Did PCN reaction causing immediate rash, facial/tongue/throat swelling, SOB or lightheadedness with hypotension: no Did PCN reaction causing severe rash involving mucus membranes or skin necrosis: no Did PCN reaction that required hospitalization : no Did pcn reaction occurring within the last 10 years: no If all of the above answers are "NO", then may proceed with Cephalosporin use. Pt states he can take low doses of amoxicillin, has taken for dental procedures without reaction   Current Outpatient Medications on File Prior to Visit  Medication Sig Dispense Refill  . docusate sodium (COLACE) 100 MG capsule Take 100 mg by mouth daily.    . ferrous sulfate 325 (65 FE) MG tablet Take 325 mg by mouth daily with breakfast.    . Psyllium (METAMUCIL) 30.9 % POWD Take 1 packet by mouth daily.     . ranitidine (ZANTAC) 150 MG tablet Take 150 mg by mouth 2 (two) times daily.     No current facility-administered medications on file prior to visit.    Review of Systems  Constitutional: Negative for other unusual diaphoresis or sweats HENT:  Negative for ear discharge or swelling Eyes: Negative for other worsening visual disturbances Respiratory: Negative for stridor or other swelling  Gastrointestinal: Negative for worsening distension or other blood Genitourinary: Negative for retention or other urinary change Musculoskeletal: Negative for other MSK pain or swelling Skin: Negative for color change or other new lesions Neurological: Negative for worsening tremors and other numbness  Psychiatric/Behavioral: Negative for worsening agitation or other fatigue All other system neg per pt      Objective:   Physical Exam BP 108/70   Pulse 71   Temp 97.8 F (36.6 C) (Oral)   Resp 16   Wt 286 lb (129.7 kg)   SpO2 97%   BMI 37.73 kg/m  VS noted,  Constitutional: Pt appears in NAD HENT: Head: NCAT.  Right Ear: External ear normal.  Left Ear: External ear normal.  Eyes: . Pupils are equal, round, and reactive to light. Conjunctivae and EOM are normal Nose: without d/c or deformity Neck: Neck supple. Gross normal ROM Cardiovascular: Normal rate and regular rhythm.   Pulmonary/Chest: Effort normal and breath sounds without rales or wheezing.  Abd:  Soft, NT, ND, + BS, no organomegaly Neurological: Pt is alert. At baseline orientation, motor grossly intact Skin: Skin is warm. No rashes, other new lesions, no LE edema Psychiatric: Pt behavior is normal without agitation  No other exam findings Lab Results  Component Value Date   WBC 5.9 01/18/2018   HGB 12.3 (L) 01/18/2018   HCT 36.0 (L) 01/18/2018   PLT 234.0 01/18/2018   GLUCOSE 111 (H) 01/18/2018   CHOL 124 01/18/2018   TRIG 70.0 01/18/2018   HDL 54.00 01/18/2018   LDLDIRECT 151.7 03/25/2010   LDLCALC 56 01/18/2018   ALT 18 01/18/2018   AST 16 01/18/2018   NA 140 01/18/2018   K 4.4 01/18/2018   CL 106 01/18/2018   CREATININE 1.69 (H) 01/18/2018   BUN 29 (H) 01/18/2018   CO2 29 01/18/2018   TSH 0.75 01/18/2018   PSA 4.79 (H) 02/18/2018   HGBA1C 6.0 01/18/2018   MICROALBUR 1.5 04/21/2016       Assessment & Plan:

## 2018-07-26 NOTE — Patient Instructions (Addendum)
Please continue all other medications as before, and refills have been done if requested.  Please have the pharmacy call with any other refills you may need.  Please continue your efforts at being more active, low cholesterol diet, and weight control..  Please keep your appointments with your specialists as you may have planned  Please return in 6 months, or sooner if needed, with Lab testing done 3-5 days before  

## 2018-07-26 NOTE — Assessment & Plan Note (Signed)
Also for f/u lab today, cont otc vit d3 2000 qd

## 2018-07-26 NOTE — Assessment & Plan Note (Addendum)
stable overall by history and exam, recent data reviewed with pt, and pt to continue medical treatment as before,  to f/u any worsening symptoms or concerns Lab Results  Component Value Date   CREATININE 1.69 (H) 01/18/2018  declines lab f/u today - for next visit

## 2018-07-26 NOTE — Assessment & Plan Note (Addendum)
Lab Results  Component Value Date   LDLCALC 56 01/18/2018   stable overall by history and exam, recent data reviewed with pt, and pt to continue medical treatment as before,  to f/u any worsening symptoms or concerns, declines labs today, for f/u next visit

## 2018-07-26 NOTE — Assessment & Plan Note (Addendum)
Asympt, for f/u psa with next labs, declines today

## 2018-07-26 NOTE — Assessment & Plan Note (Signed)
Lab Results  Component Value Date   HGBA1C 6.0 01/18/2018  stable overall by history and exam, recent data reviewed with pt, and pt to continue medical treatment as before,  to f/u any worsening symptoms or concerns

## 2018-08-23 DIAGNOSIS — H2513 Age-related nuclear cataract, bilateral: Secondary | ICD-10-CM | POA: Diagnosis not present

## 2018-08-30 ENCOUNTER — Ambulatory Visit (INDEPENDENT_AMBULATORY_CARE_PROVIDER_SITE_OTHER)
Admission: RE | Admit: 2018-08-30 | Discharge: 2018-08-30 | Disposition: A | Discharge status: Home / Self Care | Payer: 59 | Source: Ambulatory Visit | Attending: Acute Care | Admitting: Acute Care

## 2018-08-30 DIAGNOSIS — Z122 Encounter for screening for malignant neoplasm of respiratory organs: Secondary | ICD-10-CM

## 2018-08-30 DIAGNOSIS — Z87891 Personal history of nicotine dependence: Secondary | ICD-10-CM | POA: Diagnosis not present

## 2018-09-02 ENCOUNTER — Other Ambulatory Visit: Payer: Self-pay | Admitting: Acute Care

## 2018-09-02 DIAGNOSIS — Z87891 Personal history of nicotine dependence: Secondary | ICD-10-CM

## 2018-09-02 DIAGNOSIS — Z122 Encounter for screening for malignant neoplasm of respiratory organs: Secondary | ICD-10-CM

## 2019-01-24 ENCOUNTER — Other Ambulatory Visit (INDEPENDENT_AMBULATORY_CARE_PROVIDER_SITE_OTHER): Payer: 59

## 2019-01-24 DIAGNOSIS — E7800 Pure hypercholesterolemia, unspecified: Secondary | ICD-10-CM | POA: Diagnosis not present

## 2019-01-24 DIAGNOSIS — R7302 Impaired glucose tolerance (oral): Secondary | ICD-10-CM

## 2019-01-24 DIAGNOSIS — R972 Elevated prostate specific antigen [PSA]: Secondary | ICD-10-CM | POA: Diagnosis not present

## 2019-01-24 DIAGNOSIS — E559 Vitamin D deficiency, unspecified: Secondary | ICD-10-CM

## 2019-01-24 LAB — LIPID PANEL
CHOLESTEROL: 122 mg/dL (ref 0–200)
HDL: 51.7 mg/dL (ref >39.00)
LDL Cholesterol: 59 mg/dL (ref 0–99)
NonHDL: 70.08
Total CHOL/HDL Ratio: 2
Triglycerides: 55 mg/dL (ref 0.0–149.0)
VLDL: 11 mg/dL (ref 0.0–40.0)

## 2019-01-24 LAB — CBC WITH DIFFERENTIAL/PLATELET
Basophils Absolute: 0.1 10*3/uL (ref 0.0–0.1)
Basophils Relative: 1.1 % (ref 0.0–3.0)
Eosinophils Absolute: 0.1 10*3/uL (ref 0.0–0.7)
Eosinophils Relative: 2.7 % (ref 0.0–5.0)
HEMATOCRIT: 35.4 % — AB (ref 39.0–52.0)
Hemoglobin: 11.9 g/dL — ABNORMAL LOW (ref 13.0–17.0)
Lymphocytes Relative: 22.7 % (ref 12.0–46.0)
Lymphs Abs: 1.3 10*3/uL (ref 0.7–4.0)
MCHC: 33.5 g/dL (ref 30.0–36.0)
MCV: 92.8 fl (ref 78.0–100.0)
Monocytes Absolute: 0.6 10*3/uL (ref 0.1–1.0)
Monocytes Relative: 10.3 % (ref 3.0–12.0)
Neutro Abs: 3.5 10*3/uL (ref 1.4–7.7)
Neutrophils Relative %: 63.2 % (ref 43.0–77.0)
Platelets: 210 10*3/uL (ref 150.0–400.0)
RBC: 3.81 Mil/uL — AB (ref 4.22–5.81)
RDW: 13.8 % (ref 11.5–15.5)
WBC: 5.5 10*3/uL (ref 4.0–10.5)

## 2019-01-24 LAB — URINALYSIS, ROUTINE W REFLEX MICROSCOPIC
Bilirubin Urine: NEGATIVE
Hgb urine dipstick: NEGATIVE
Ketones, ur: NEGATIVE
Leukocytes, UA: NEGATIVE
Nitrite: NEGATIVE
Specific Gravity, Urine: 1.01 (ref 1.000–1.030)
Total Protein, Urine: NEGATIVE
Urine Glucose: NEGATIVE
Urobilinogen, UA: 0.2 (ref 0.0–1.0)
pH: 7 (ref 5.0–8.0)

## 2019-01-24 LAB — HEPATIC FUNCTION PANEL
ALT: 15 U/L (ref 0–53)
AST: 14 U/L (ref 0–37)
Albumin: 4.2 g/dL (ref 3.5–5.2)
Alkaline Phosphatase: 70 U/L (ref 39–117)
Bilirubin, Direct: 0.1 mg/dL (ref 0.0–0.3)
TOTAL PROTEIN: 6.8 g/dL (ref 6.0–8.3)
Total Bilirubin: 0.5 mg/dL (ref 0.2–1.2)

## 2019-01-24 LAB — HEMOGLOBIN A1C: Hgb A1c MFr Bld: 5.9 % (ref 4.6–6.5)

## 2019-01-24 LAB — TSH: TSH: 0.7 u[IU]/mL (ref 0.35–4.50)

## 2019-01-24 LAB — VITAMIN D 25 HYDROXY (VIT D DEFICIENCY, FRACTURES): VITD: 36.72 ng/mL (ref 30.00–100.00)

## 2019-01-24 LAB — PSA: PSA: 4.6 ng/mL — ABNORMAL HIGH (ref 0.10–4.00)

## 2019-01-24 LAB — BASIC METABOLIC PANEL
BUN: 31 mg/dL — ABNORMAL HIGH (ref 6–23)
CO2: 27 mEq/L (ref 19–32)
Calcium: 8.9 mg/dL (ref 8.4–10.5)
Chloride: 103 mEq/L (ref 96–112)
Creatinine, Ser: 1.66 mg/dL — ABNORMAL HIGH (ref 0.40–1.50)
GFR: 41.83 mL/min — AB (ref >60.00)
Glucose, Bld: 104 mg/dL — ABNORMAL HIGH (ref 70–99)
Potassium: 4.5 mEq/L (ref 3.5–5.1)
Sodium: 139 mEq/L (ref 135–145)

## 2019-01-31 ENCOUNTER — Encounter: Payer: Self-pay | Admitting: Internal Medicine

## 2019-01-31 ENCOUNTER — Ambulatory Visit: Payer: 59 | Admitting: Internal Medicine

## 2019-01-31 VITALS — BP 124/76 | HR 50 | Temp 98.0°F | Ht 73.0 in | Wt 293.0 lb

## 2019-01-31 DIAGNOSIS — R7302 Impaired glucose tolerance (oral): Secondary | ICD-10-CM | POA: Diagnosis not present

## 2019-01-31 DIAGNOSIS — Z0000 Encounter for general adult medical examination without abnormal findings: Secondary | ICD-10-CM | POA: Diagnosis not present

## 2019-01-31 MED ORDER — AMITRIPTYLINE HCL 10 MG PO TABS: 10.0000 mg | ORAL_TABLET | Freq: Every day | ORAL | 1 refills | Status: DC

## 2019-01-31 MED ORDER — AMLODIPINE BESYLATE 2.5 MG PO TABS: 2.5000 mg | ORAL_TABLET | Freq: Every day | ORAL | 3 refills | Status: DC

## 2019-01-31 NOTE — Patient Instructions (Signed)
Please continue all other medications as before, and refills have been done if requested.  Please have the pharmacy call with any other refills you may need.  Please continue your efforts at being more active, low cholesterol diet, and weight control.  You are otherwise up to date with prevention measures today.  Please keep your appointments with your specialists as you may have planned  Please return in 6 months, or sooner if needed, with Lab testing done 3-5 days before  

## 2019-01-31 NOTE — Progress Notes (Signed)
Subjective:    Patient ID: Henry Ellis, male    DOB: Mar 25, 1956, 63 y.o.   MRN: [id number]  HPI  Here for wellness and f/u;  Overall doing ok;  Pt denies Chest pain, worsening SOB, DOE, wheezing, orthopnea, PND, worsening LE edema, palpitations, dizziness or syncope.  Pt denies neurological change such as new headache, facial or extremity weakness.  Pt denies polydipsia, polyuria, or low sugar symptoms. Pt states overall good compliance with treatment and medications, good tolerability, and has been trying to follow appropriate diet.  Pt denies worsening depressive symptoms, suicidal ideation or panic. No fever, night sweats, wt loss, loss of appetite, or other constitutional symptoms.  Pt states good ability with ADL's, has low fall risk, home safety reviewed and adequate, no other significant changes in hearing or vision, and not active with exercise. Plans to retire soon.  Gained several lbs with quitting smoking. Wt Readings from Last 3 Encounters:  01/31/19 293 lb (132.9 kg)  07/26/18 286 lb (129.7 kg)  03/07/18 279 lb (126.6 kg)  Also has mild intermittent right arm numbness, hard to say where starts, and without pain or weakness.  Also has left hand numbness and mild discomfort, worse after working with the hands during the day, and drives total 3 hrs per related to work.  No other new complaints Past Medical History:  Diagnosis Date  . Anemia   . Anxiety   . Arthritis   . Barrett's esophagus   . Cancer ([place]) 10/17/2016   kidney cancer  . Carpal tunnel syndrome, right 04/21/2011  . Chronic bronchitis   . ED (erectile dysfunction)   . Esophageal reflux   . Esophageal stricture   . Essential hypertension 10/19/2015  . H/O adenomatous polyp of colon 06/05/2012  . Hiatal hernia   . High cholesterol   . History of Bell's palsy   . IBS (irritable bowel syndrome)   . Personal history of colonic polyps 04/15/2008 & 04/22/2012   TUBULAR ADENOMA  . Ulcerative esophagitis    Past Surgical  History:  Procedure Laterality Date  . CARPAL TUNNEL RELEASE     left  . INGUINAL HERNIA REPAIR     bilateral  . KNEE ARTHROSCOPY     right [name]  . ROBOT ASSISTED LAPAROSCOPIC NEPHRECTOMY Left 10/18/2016   Procedure: XI ROBOTIC ASSISTED LAPAROSCOPIC RADICAL NEPHRECTOMY;  Surgeon: Alexis Frock, MD;  Location: [facility];  Service: Urology;  Laterality: Left;    reports that he quit smoking about 2 years ago. His smoking use included cigarettes. He smoked 1.00 pack per day. He has never used smokeless tobacco. He reports current alcohol use of about 2.0 standard drinks of alcohol per week. He reports current drug use. Frequency: 2.00 times per week. Drug: Marijuana. family history includes Heart disease in his father; Hypertension in his brother; Lung cancer in his father. Allergies  Allergen Reactions  . Bee Venom Anaphylaxis  . Aspirin Nausea Only  . Amoxicillin Rash    Did PCN reaction causing immediate rash, facial/tongue/throat swelling, SOB or lightheadedness with hypotension: no Did PCN reaction causing severe rash involving mucus membranes or skin necrosis: no Did PCN reaction that required hospitalization : no Did pcn reaction occurring within the last 10 years: no If all of the above answers are "NO", then may proceed with Cephalosporin use. Pt states he can take low doses of amoxicillin, has taken for dental procedures without reaction   Current Outpatient Medications on File Prior to Visit  Medication Sig  Dispense Refill  . atorvastatin (LIPITOR) 20 MG tablet TAKE 1 TABLET(20 MG) BY MOUTH DAILY AT 6 PM 90 tablet 3  . Cholecalciferol (VITAMIN D3 PO) Take by mouth.    . docusate sodium (COLACE) 100 MG capsule Take 100 mg by mouth daily.    Marland Kitchen esomeprazole (NEXIUM) 20 MG capsule Take 20 mg by mouth daily at 12 noon.    . ferrous sulfate 325 (65 FE) MG tablet Take 325 mg by mouth daily with breakfast.    . Psyllium (METAMUCIL) 30.9 % POWD Take 1 packet by mouth daily.     .  tamsulosin (FLOMAX) 0.4 MG CAPS capsule Take 1 capsule (0.4 mg total) by mouth daily. 90 capsule 3   No current facility-administered medications on file prior to visit.    Review of Systems Constitutional: Negative for other unusual diaphoresis, sweats, appetite or weight changes HENT: Negative for other worsening hearing loss, ear pain, facial swelling, mouth sores or neck stiffness.   Eyes: Negative for other worsening pain, redness or other visual disturbance.  Respiratory: Negative for other stridor or swelling Cardiovascular: Negative for other palpitations or other chest pain  Gastrointestinal: Negative for worsening diarrhea or loose stools, blood in stool, distention or other pain Genitourinary: Negative for hematuria, flank pain or other change in urine volume.  Musculoskeletal: Negative for myalgias or other joint swelling.  Skin: Negative for other color change, or other wound or worsening drainage.  Neurological: Negative for other syncope or numbness. Hematological: Negative for other adenopathy or swelling Psychiatric/Behavioral: Negative for hallucinations, other worsening agitation, SI, self-injury, or new decreased concentration All other system neg per pt    Objective:   Physical Exam BP 124/76 (BP Location: Left Arm, Patient Position: Sitting, Cuff Size: Large)   Pulse (!) 50   Temp 98 F (36.7 C) (Oral)   Ht 6\' 1"  (1.854 m)   Wt 293 lb (132.9 kg)   SpO2 99%   BMI 38.66 kg/m  VS noted,  Constitutional: Pt is oriented to person, place, and time. Appears well-developed and well-nourished, in no significant distress and comfortable Head: Normocephalic and atraumatic  Eyes: Conjunctivae and EOM are normal. Pupils are equal, round, and reactive to light Right Ear: External ear normal without discharge Left Ear: External ear normal without discharge Nose: Nose without discharge or deformity Mouth/Throat: Oropharynx is without other ulcerations and moist  Neck:  Normal range of motion. Neck supple. No JVD present. No tracheal deviation present or significant neck LA or mass Cardiovascular: Normal rate, regular rhythm, normal heart sounds and intact distal pulses Pulmonary/Chest: WOB normal and breath sounds without rales or wheezing  Abdominal: Soft. Bowel sounds are normal. NT. No HSM  Musculoskeletal: Normal range of motion. Exhibits no edema Lymphadenopathy: Has no other cervical adenopathy.  Neurological: Pt is alert and oriented to person, place, and time. Pt has normal reflexes. No cranial nerve deficit. Motor grossly intact, Gait intact Skin: Skin is warm and dry. No rash noted or new ulcerations Psychiatric:  Has normal mood and affect. Behavior is normal without agitation] No other exam findings Lab Results  Component Value Date   WBC 5.5 01/24/2019   HGB 11.9 (L) 01/24/2019   HCT 35.4 (L) 01/24/2019   PLT 210.0 01/24/2019   GLUCOSE 104 (H) 01/24/2019   CHOL 122 01/24/2019   TRIG 55.0 01/24/2019   HDL 51.70 01/24/2019   LDLDIRECT 151.7 03/25/2010   LDLCALC 59 01/24/2019   ALT 15 01/24/2019   AST 14 01/24/2019  NA 139 01/24/2019   K 4.5 01/24/2019   CL 103 01/24/2019   CREATININE 1.66 (H) 01/24/2019   BUN 31 (H) 01/24/2019   CO2 27 01/24/2019   TSH 0.70 01/24/2019   PSA 4.60 (H) 01/24/2019   HGBA1C 5.9 01/24/2019   MICROALBUR 1.5 04/21/2016         Assessment & Plan:

## 2019-02-01 ENCOUNTER — Encounter: Payer: Self-pay | Admitting: Internal Medicine

## 2019-02-01 NOTE — Assessment & Plan Note (Signed)

## 2019-02-01 NOTE — Assessment & Plan Note (Signed)
stable overall by history and exam, recent data reviewed with pt, and pt to continue medical treatment as before,  to f/u any worsening symptoms or concerns  

## 2019-05-02 DIAGNOSIS — C642 Malignant neoplasm of left kidney, except renal pelvis: Secondary | ICD-10-CM | POA: Diagnosis not present

## 2019-05-02 DIAGNOSIS — Z125 Encounter for screening for malignant neoplasm of prostate: Secondary | ICD-10-CM | POA: Diagnosis not present

## 2019-05-05 DIAGNOSIS — C642 Malignant neoplasm of left kidney, except renal pelvis: Secondary | ICD-10-CM | POA: Diagnosis not present

## 2019-05-05 DIAGNOSIS — Z905 Acquired absence of kidney: Secondary | ICD-10-CM | POA: Diagnosis not present

## 2019-05-05 DIAGNOSIS — N183 Chronic kidney disease, stage 3 (moderate): Secondary | ICD-10-CM | POA: Diagnosis not present

## 2019-05-27 DIAGNOSIS — I129 Hypertensive chronic kidney disease with stage 1 through stage 4 chronic kidney disease, or unspecified chronic kidney disease: Secondary | ICD-10-CM | POA: Diagnosis not present

## 2019-05-27 DIAGNOSIS — N183 Chronic kidney disease, stage 3 (moderate): Secondary | ICD-10-CM | POA: Diagnosis not present

## 2019-07-18 DIAGNOSIS — B351 Tinea unguium: Secondary | ICD-10-CM | POA: Diagnosis not present

## 2019-07-18 DIAGNOSIS — L600 Ingrowing nail: Secondary | ICD-10-CM | POA: Diagnosis not present

## 2019-07-25 ENCOUNTER — Other Ambulatory Visit (INDEPENDENT_AMBULATORY_CARE_PROVIDER_SITE_OTHER): Payer: Medicare Other

## 2019-07-25 DIAGNOSIS — R7302 Impaired glucose tolerance (oral): Secondary | ICD-10-CM | POA: Diagnosis not present

## 2019-07-25 LAB — LIPID PANEL
Cholesterol: 121 mg/dL (ref 0–200)
HDL: 57.5 mg/dL (ref >39.00)
LDL Cholesterol: 52 mg/dL (ref 0–99)
NonHDL: 63.04
Total CHOL/HDL Ratio: 2
Triglycerides: 56 mg/dL (ref 0.0–149.0)
VLDL: 11.2 mg/dL (ref 0.0–40.0)

## 2019-07-25 LAB — HEPATIC FUNCTION PANEL
ALT: 15 U/L (ref 0–53)
AST: 15 U/L (ref 0–37)
Albumin: 4.2 g/dL (ref 3.5–5.2)
Alkaline Phosphatase: 66 U/L (ref 39–117)
Bilirubin, Direct: 0.1 mg/dL (ref 0.0–0.3)
Total Bilirubin: 0.5 mg/dL (ref 0.2–1.2)
Total Protein: 7.1 g/dL (ref 6.0–8.3)

## 2019-07-25 LAB — BASIC METABOLIC PANEL
BUN: 24 mg/dL — ABNORMAL HIGH (ref 6–23)
CO2: 27 mEq/L (ref 19–32)
Calcium: 9.3 mg/dL (ref 8.4–10.5)
Chloride: 104 mEq/L (ref 96–112)
Creatinine, Ser: 1.33 mg/dL (ref 0.40–1.50)
GFR: 53.94 mL/min — ABNORMAL LOW (ref >60.00)
Glucose, Bld: 107 mg/dL — ABNORMAL HIGH (ref 70–99)
Potassium: 3.9 mEq/L (ref 3.5–5.1)
Sodium: 139 mEq/L (ref 135–145)

## 2019-07-25 LAB — HEMOGLOBIN A1C: Hgb A1c MFr Bld: 6 % (ref 4.6–6.5)

## 2019-08-01 ENCOUNTER — Encounter: Payer: Self-pay | Admitting: Internal Medicine

## 2019-08-01 ENCOUNTER — Ambulatory Visit (INDEPENDENT_AMBULATORY_CARE_PROVIDER_SITE_OTHER): Payer: Medicare Other | Admitting: Internal Medicine

## 2019-08-01 ENCOUNTER — Other Ambulatory Visit (INDEPENDENT_AMBULATORY_CARE_PROVIDER_SITE_OTHER): Payer: Medicare Other

## 2019-08-01 ENCOUNTER — Telehealth: Payer: Self-pay

## 2019-08-01 ENCOUNTER — Other Ambulatory Visit: Payer: Self-pay

## 2019-08-01 VITALS — BP 136/86 | HR 66 | Temp 97.8°F | Ht 73.0 in | Wt 283.0 lb

## 2019-08-01 DIAGNOSIS — I10 Essential (primary) hypertension: Secondary | ICD-10-CM | POA: Diagnosis not present

## 2019-08-01 DIAGNOSIS — Z23 Encounter for immunization: Secondary | ICD-10-CM | POA: Diagnosis not present

## 2019-08-01 DIAGNOSIS — R972 Elevated prostate specific antigen [PSA]: Secondary | ICD-10-CM

## 2019-08-01 DIAGNOSIS — R7302 Impaired glucose tolerance (oral): Secondary | ICD-10-CM | POA: Diagnosis not present

## 2019-08-01 DIAGNOSIS — E559 Vitamin D deficiency, unspecified: Secondary | ICD-10-CM

## 2019-08-01 DIAGNOSIS — E611 Iron deficiency: Secondary | ICD-10-CM

## 2019-08-01 DIAGNOSIS — E538 Deficiency of other specified B group vitamins: Secondary | ICD-10-CM

## 2019-08-01 DIAGNOSIS — Z0000 Encounter for general adult medical examination without abnormal findings: Secondary | ICD-10-CM

## 2019-08-01 LAB — PSA: PSA: 4.78 ng/mL — ABNORMAL HIGH (ref 0.10–4.00)

## 2019-08-01 MED ORDER — TAMSULOSIN HCL 0.4 MG PO CAPS: 0.4000 mg | ORAL_CAPSULE | Freq: Every day | ORAL | 3 refills | Status: DC

## 2019-08-01 MED ORDER — AMITRIPTYLINE HCL 10 MG PO TABS: 10.0000 mg | ORAL_TABLET | Freq: Every day | ORAL | 3 refills | Status: DC

## 2019-08-01 MED ORDER — AMLODIPINE BESYLATE 2.5 MG PO TABS: 2.5000 mg | ORAL_TABLET | Freq: Every day | ORAL | 3 refills | Status: DC

## 2019-08-01 MED ORDER — ATORVASTATIN CALCIUM 20 MG PO TABS: ORAL_TABLET | ORAL | 3 refills | Status: DC

## 2019-08-01 NOTE — Telephone Encounter (Signed)
Pt has been informed of results and expressed understanding.  °

## 2019-08-01 NOTE — Patient Instructions (Addendum)
You had the Tdap tetanus shot today, and the Prevnar 13 pneumonia shot  Please continue all other medications as before, and refills have been done if requested.  Please have the pharmacy call with any other refills you may need.  Please continue your efforts at being more active, low cholesterol diet, and weight control..  Please keep your appointments with your specialists as you may have planned  Please go to the LAB in the Basement (turn left off the elevator) for the tests to be done today - just the PSA today  You will be contacted by phone if any changes need to be made immediately.  Otherwise, you will receive a letter about your results with an explanation, but please check with MyChart first.  Please remember to sign up for MyChart if you have not done so, as this will be important to you in the future with finding out test results, communicating by private email, and scheduling acute appointments online when needed.  Please return in 6 months, or sooner if needed, with Lab testing done 3-5 days before

## 2019-08-01 NOTE — Progress Notes (Signed)
Subjective:    Patient ID: Henry Ellis, male    DOB: 1956-11-12, 63 y.o.   MRN: [id number]  HPI   Here to f/u; overall doing ok,  Pt denies chest pain, increasing sob or doe, wheezing, orthopnea, PND, increased LE swelling, palpitations, dizziness or syncope.  Pt denies new neurological symptoms such as new headache, or facial or extremity weakness or numbness.  Pt denies polydipsia, polyuria, or low sugar episode.  Pt states overall good compliance with meds, mostly trying to follow appropriate diet, with wt overall stable,  but little exercise however.   Lost 10 lbs with better diet.   Wt Readings from Last 3 Encounters:  08/01/19 283 lb (128.4 kg)  01/31/19 293 lb (132.9 kg)  07/26/18 286 lb (129.7 kg)  Plans to retire in 1 yr. Denies urinary symptoms such as dysuria, frequency, urgency, flank pain, hematuria or n/v, fever, chills.  No new complaints. Due for Prevnar 13 Past Medical History:  Diagnosis Date  . Anemia   . Anxiety   . Arthritis   . Barrett's esophagus   . Cancer ([place]) 10/17/2016   kidney cancer  . Carpal tunnel syndrome, right 04/21/2011  . Chronic bronchitis   . ED (erectile dysfunction)   . Esophageal reflux   . Esophageal stricture   . Essential hypertension 10/19/2015  . H/O adenomatous polyp of colon 06/05/2012  . Hiatal hernia   . High cholesterol   . History of Bell's palsy   . IBS (irritable bowel syndrome)   . Personal history of colonic polyps 04/15/2008 & 04/22/2012   TUBULAR ADENOMA  . Ulcerative esophagitis    Past Surgical History:  Procedure Laterality Date  . CARPAL TUNNEL RELEASE     left  . INGUINAL HERNIA REPAIR     bilateral  . KNEE ARTHROSCOPY     right [name]  . ROBOT ASSISTED LAPAROSCOPIC NEPHRECTOMY Left 10/18/2016   Procedure: XI ROBOTIC ASSISTED LAPAROSCOPIC RADICAL NEPHRECTOMY;  Surgeon: Alexis Frock, MD;  Location: [facility];  Service: Urology;  Laterality: Left;    reports that he quit smoking about 2 years ago. His smoking  use included cigarettes. He smoked 1.00 pack per day. He has never used smokeless tobacco. He reports current alcohol use of about 2.0 standard drinks of alcohol per week. He reports current drug use. Frequency: 2.00 times per week. Drug: Marijuana. family history includes Heart disease in his father; Hypertension in his brother; Lung cancer in his father. Allergies  Allergen Reactions  . Bee Venom Anaphylaxis  . Aspirin Nausea Only  . Amoxicillin Rash    Did PCN reaction causing immediate rash, facial/tongue/throat swelling, SOB or lightheadedness with hypotension: no Did PCN reaction causing severe rash involving mucus membranes or skin necrosis: no Did PCN reaction that required hospitalization : no Did pcn reaction occurring within the last 10 years: no If all of the above answers are "NO", then may proceed with Cephalosporin use. Pt states he can take low doses of amoxicillin, has taken for dental procedures without reaction   Current Outpatient Medications on File Prior to Visit  Medication Sig Dispense Refill  . Cholecalciferol (VITAMIN D3 PO) Take by mouth.    . docusate sodium (COLACE) 100 MG capsule Take 100 mg by mouth daily.    Marland Kitchen esomeprazole (NEXIUM) 20 MG capsule Take 20 mg by mouth daily at 12 noon.    . ferrous sulfate 325 (65 FE) MG tablet Take 325 mg by mouth daily with breakfast.    .  Psyllium (METAMUCIL) 30.9 % POWD Take 1 packet by mouth daily.      No current facility-administered medications on file prior to visit.    Review of Systems  Constitutional: Negative for other unusual diaphoresis or sweats HENT: Negative for ear discharge or swelling Eyes: Negative for other worsening visual disturbances Respiratory: Negative for stridor or other swelling  Gastrointestinal: Negative for worsening distension or other blood Genitourinary: Negative for retention or other urinary change Musculoskeletal: Negative for other MSK pain or swelling Skin: Negative for color  change or other new lesions Neurological: Negative for worsening tremors and other numbness  Psychiatric/Behavioral: Negative for worsening agitation or other fatigue All other system neg per pt    Objective:   Physical Exam BP 136/86   Pulse 66   Temp 97.8 F (36.6 C) (Oral)   Ht 6\' 1"  (1.854 m)   Wt 283 lb (128.4 kg)   SpO2 95%   BMI 37.34 kg/m  VS noted,  Constitutional: Pt appears in NAD HENT: Head: NCAT.  Right Ear: External ear normal.  Left Ear: External ear normal.  Eyes: . Pupils are equal, round, and reactive to light. Conjunctivae and EOM are normal Nose: without d/c or deformity Neck: Neck supple. Gross normal ROM Cardiovascular: Normal rate and regular rhythm.   Pulmonary/Chest: Effort normal and breath sounds without rales or wheezing.  Abd:  Soft, NT, ND, + BS, no organomegaly Neurological: Pt is alert. At baseline orientation, motor grossly intact Skin: Skin is warm. No rashes, other new lesions, no LE edema Psychiatric: Pt behavior is normal without agitation  No other exam findings Lab Results  Component Value Date   WBC 5.5 01/24/2019   HGB 11.9 (L) 01/24/2019   HCT 35.4 (L) 01/24/2019   PLT 210.0 01/24/2019   GLUCOSE 107 (H) 07/25/2019   CHOL 121 07/25/2019   TRIG 56.0 07/25/2019   HDL 57.50 07/25/2019   LDLDIRECT 151.7 03/25/2010   LDLCALC 52 07/25/2019   ALT 15 07/25/2019   AST 15 07/25/2019   NA 139 07/25/2019   K 3.9 07/25/2019   CL 104 07/25/2019   CREATININE 1.33 07/25/2019   BUN 24 (H) 07/25/2019   CO2 27 07/25/2019   TSH 0.70 01/24/2019   PSA 4.78 (H) 08/01/2019   HGBA1C 6.0 07/25/2019   MICROALBUR 1.5 04/21/2016         Assessment & Plan:

## 2019-08-01 NOTE — Telephone Encounter (Signed)
-----   Message from Biagio Borg, MD sent at 08/01/2019 12:12 PM EDT ----- Left message on MyChart, pt to cont same tx except  The test results show that your current treatment is OK, as the PSA is slightly elevated but not worse than the previous levels.    Anquan Azzarello to please inform pt

## 2019-08-02 ENCOUNTER — Encounter: Payer: Self-pay | Admitting: Internal Medicine

## 2019-08-02 DIAGNOSIS — R972 Elevated prostate specific antigen [PSA]: Secondary | ICD-10-CM | POA: Insufficient documentation

## 2019-08-02 NOTE — Assessment & Plan Note (Signed)
stable overall by history and exam, recent data reviewed with pt, and pt to continue medical treatment as before,  to f/u any worsening symptoms or concerns  

## 2019-08-02 NOTE — Assessment & Plan Note (Signed)
Asympt, for f/u psa 

## 2019-09-22 ENCOUNTER — Ambulatory Visit (INDEPENDENT_AMBULATORY_CARE_PROVIDER_SITE_OTHER)
Admission: RE | Admit: 2019-09-22 | Discharge: 2019-09-22 | Disposition: A | Discharge status: Home / Self Care | Payer: Medicare Other | Source: Ambulatory Visit | Attending: Acute Care | Admitting: Acute Care

## 2019-09-22 ENCOUNTER — Other Ambulatory Visit: Payer: Self-pay

## 2019-09-22 DIAGNOSIS — Z122 Encounter for screening for malignant neoplasm of respiratory organs: Secondary | ICD-10-CM

## 2019-09-22 DIAGNOSIS — Z87891 Personal history of nicotine dependence: Secondary | ICD-10-CM | POA: Diagnosis not present

## 2019-09-29 ENCOUNTER — Telehealth: Payer: Self-pay | Admitting: Acute Care

## 2019-09-29 DIAGNOSIS — Z87891 Personal history of nicotine dependence: Secondary | ICD-10-CM

## 2019-09-29 DIAGNOSIS — Z122 Encounter for screening for malignant neoplasm of respiratory organs: Secondary | ICD-10-CM

## 2019-09-29 NOTE — Telephone Encounter (Signed)
Pt informed of CT results per Sarah Groce, NP.  PT verbalized understanding.  Copy sent to PCP.  Order placed for 1 yr f/u CT.  

## 2019-10-28 DIAGNOSIS — Z125 Encounter for screening for malignant neoplasm of prostate: Secondary | ICD-10-CM | POA: Diagnosis not present

## 2019-11-04 DIAGNOSIS — C642 Malignant neoplasm of left kidney, except renal pelvis: Secondary | ICD-10-CM | POA: Diagnosis not present

## 2020-01-06 DIAGNOSIS — R972 Elevated prostate specific antigen [PSA]: Secondary | ICD-10-CM | POA: Diagnosis not present

## 2020-01-09 ENCOUNTER — Ambulatory Visit: Payer: Medicare Other | Attending: Internal Medicine

## 2020-01-09 DIAGNOSIS — Z23 Encounter for immunization: Secondary | ICD-10-CM | POA: Insufficient documentation

## 2020-01-09 NOTE — Progress Notes (Signed)
   Covid-19 Vaccination Clinic  Name:  Henry Ellis    MRN: [id number] DOB: [birth year]  01/09/2020  [name] was observed post Covid-19 immunization for 15 minutes without incidence. He was provided with Vaccine Information Sheet and instruction to access the V-Safe system.   [name] was instructed to call 911 with any severe reactions post vaccine: Marland Kitchen Difficulty breathing  . Swelling of your face and throat  . A fast heartbeat  . A bad rash all over your body  . Dizziness and weakness    Immunizations Administered    Name Date Dose VIS Date Route   Pfizer COVID-19 Vaccine 01/09/2020 10:21 AM 0.3 mL 11/28/2019 Intramuscular   Manufacturer: [place]   Lot: [id number]   [place]: [id number]

## 2020-01-13 DIAGNOSIS — Z905 Acquired absence of kidney: Secondary | ICD-10-CM | POA: Diagnosis not present

## 2020-01-13 DIAGNOSIS — R972 Elevated prostate specific antigen [PSA]: Secondary | ICD-10-CM | POA: Diagnosis not present

## 2020-01-13 DIAGNOSIS — C642 Malignant neoplasm of left kidney, except renal pelvis: Secondary | ICD-10-CM | POA: Diagnosis not present

## 2020-01-30 ENCOUNTER — Ambulatory Visit: Payer: Medicare Other | Attending: Internal Medicine

## 2020-01-30 DIAGNOSIS — Z23 Encounter for immunization: Secondary | ICD-10-CM

## 2020-01-30 NOTE — Progress Notes (Signed)
   Covid-19 Vaccination Clinic  Name:  Henry Ellis    MRN: [id number] DOB: June 07, 1956  01/30/2020  [name] was observed post Covid-19 immunization for 15 minutes without incidence. He was provided with Vaccine Information Sheet and instruction to access the V-Safe system.   [name] was instructed to call 911 with any severe reactions post vaccine: Marland Kitchen Difficulty breathing  . Swelling of your face and throat  . A fast heartbeat  . A bad rash all over your body  . Dizziness and weakness    Immunizations Administered    Name Date Dose VIS Date Route   Pfizer COVID-19 Vaccine 01/30/2020 12:33 PM 0.3 mL 11/28/2019 Intramuscular   Manufacturer: [place]   Lot: [id number]   [place]: [id number]

## 2020-02-06 ENCOUNTER — Ambulatory Visit: Payer: Medicare Other | Admitting: Internal Medicine

## 2020-02-13 ENCOUNTER — Other Ambulatory Visit: Payer: Self-pay

## 2020-02-13 ENCOUNTER — Ambulatory Visit (INDEPENDENT_AMBULATORY_CARE_PROVIDER_SITE_OTHER): Payer: Medicare Other | Admitting: Internal Medicine

## 2020-02-13 ENCOUNTER — Encounter: Payer: Self-pay | Admitting: Internal Medicine

## 2020-02-13 VITALS — BP 136/80 | HR 62 | Temp 98.2°F | Ht 73.0 in | Wt 290.0 lb

## 2020-02-13 DIAGNOSIS — E538 Deficiency of other specified B group vitamins: Secondary | ICD-10-CM | POA: Diagnosis not present

## 2020-02-13 DIAGNOSIS — Z0000 Encounter for general adult medical examination without abnormal findings: Secondary | ICD-10-CM | POA: Diagnosis not present

## 2020-02-13 DIAGNOSIS — E559 Vitamin D deficiency, unspecified: Secondary | ICD-10-CM | POA: Diagnosis not present

## 2020-02-13 DIAGNOSIS — R7302 Impaired glucose tolerance (oral): Secondary | ICD-10-CM | POA: Diagnosis not present

## 2020-02-13 DIAGNOSIS — R040 Epistaxis: Secondary | ICD-10-CM | POA: Diagnosis not present

## 2020-02-13 DIAGNOSIS — E611 Iron deficiency: Secondary | ICD-10-CM | POA: Diagnosis not present

## 2020-02-13 DIAGNOSIS — N183 Chronic kidney disease, stage 3 unspecified: Secondary | ICD-10-CM

## 2020-02-13 LAB — LIPID PANEL
Cholesterol: 145 mg/dL (ref 0–200)
HDL: 66.7 mg/dL (ref >39.00)
LDL Cholesterol: 68 mg/dL (ref 0–99)
NonHDL: 78
Total CHOL/HDL Ratio: 2
Triglycerides: 50 mg/dL (ref 0.0–149.0)
VLDL: 10 mg/dL (ref 0.0–40.0)

## 2020-02-13 LAB — BASIC METABOLIC PANEL
BUN: 27 mg/dL — ABNORMAL HIGH (ref 6–23)
CO2: 30 mEq/L (ref 19–32)
Calcium: 9.5 mg/dL (ref 8.4–10.5)
Chloride: 105 mEq/L (ref 96–112)
Creatinine, Ser: 1.59 mg/dL — ABNORMAL HIGH (ref 0.40–1.50)
GFR: 43.82 mL/min — ABNORMAL LOW (ref >60.00)
Glucose, Bld: 91 mg/dL (ref 70–99)
Potassium: 4.9 mEq/L (ref 3.5–5.1)
Sodium: 141 mEq/L (ref 135–145)

## 2020-02-13 LAB — CBC WITH DIFFERENTIAL/PLATELET
Basophils Absolute: 0.1 10*3/uL (ref 0.0–0.1)
Basophils Relative: 1 % (ref 0.0–3.0)
Eosinophils Absolute: 0.1 10*3/uL (ref 0.0–0.7)
Eosinophils Relative: 2.3 % (ref 0.0–5.0)
HCT: 38.3 % — ABNORMAL LOW (ref 39.0–52.0)
Hemoglobin: 12.8 g/dL — ABNORMAL LOW (ref 13.0–17.0)
Lymphocytes Relative: 13.7 % (ref 12.0–46.0)
Lymphs Abs: 0.9 10*3/uL (ref 0.7–4.0)
MCHC: 33.5 g/dL (ref 30.0–36.0)
MCV: 94.1 fl (ref 78.0–100.0)
Monocytes Absolute: 0.7 10*3/uL (ref 0.1–1.0)
Monocytes Relative: 10.1 % (ref 3.0–12.0)
Neutro Abs: 4.8 10*3/uL (ref 1.4–7.7)
Neutrophils Relative %: 72.9 % (ref 43.0–77.0)
Platelets: 208 10*3/uL (ref 150.0–400.0)
RBC: 4.07 Mil/uL — ABNORMAL LOW (ref 4.22–5.81)
RDW: 14.5 % (ref 11.5–15.5)
WBC: 6.5 10*3/uL (ref 4.0–10.5)

## 2020-02-13 LAB — URINALYSIS, ROUTINE W REFLEX MICROSCOPIC
Bilirubin Urine: NEGATIVE
Hgb urine dipstick: NEGATIVE
Ketones, ur: NEGATIVE
Leukocytes,Ua: NEGATIVE
Nitrite: NEGATIVE
RBC / HPF: NONE SEEN (ref 0–?)
Specific Gravity, Urine: 1.02 (ref 1.000–1.030)
Total Protein, Urine: NEGATIVE
Urine Glucose: NEGATIVE
Urobilinogen, UA: 0.2 (ref 0.0–1.0)
WBC, UA: NONE SEEN (ref 0–?)
pH: 6 (ref 5.0–8.0)

## 2020-02-13 LAB — HEPATIC FUNCTION PANEL
ALT: 16 U/L (ref 0–53)
AST: 14 U/L (ref 0–37)
Albumin: 4.1 g/dL (ref 3.5–5.2)
Alkaline Phosphatase: 74 U/L (ref 39–117)
Bilirubin, Direct: 0.1 mg/dL (ref 0.0–0.3)
Total Bilirubin: 0.5 mg/dL (ref 0.2–1.2)
Total Protein: 7.2 g/dL (ref 6.0–8.3)

## 2020-02-13 LAB — PSA: PSA: 3.7 ng/mL (ref 0.10–4.00)

## 2020-02-13 LAB — MICROALBUMIN / CREATININE URINE RATIO
Creatinine,U: 56.7 mg/dL
Microalb Creat Ratio: 4.2 mg/g (ref 0.0–30.0)
Microalb, Ur: 2.4 mg/dL — ABNORMAL HIGH (ref 0.0–1.9)

## 2020-02-13 LAB — VITAMIN D 25 HYDROXY (VIT D DEFICIENCY, FRACTURES): VITD: 35.75 ng/mL (ref 30.00–100.00)

## 2020-02-13 LAB — VITAMIN B12: Vitamin B-12: 444 pg/mL (ref 211–911)

## 2020-02-13 LAB — IBC PANEL
Iron: 46 ug/dL (ref 42–165)
Saturation Ratios: 18.9 % — ABNORMAL LOW (ref 20.0–50.0)
Transferrin: 174 mg/dL — ABNORMAL LOW (ref 212.0–360.0)

## 2020-02-13 LAB — TSH: TSH: 0.69 u[IU]/mL (ref 0.35–4.50)

## 2020-02-13 LAB — HEMOGLOBIN A1C: Hgb A1c MFr Bld: 5.9 % (ref 4.6–6.5)

## 2020-02-13 MED ORDER — AMLODIPINE BESYLATE 2.5 MG PO TABS: 2.5000 mg | ORAL_TABLET | Freq: Every day | ORAL | 3 refills | Status: DC

## 2020-02-13 MED ORDER — ATORVASTATIN CALCIUM 20 MG PO TABS: ORAL_TABLET | ORAL | 3 refills | Status: DC

## 2020-02-13 MED ORDER — ESOMEPRAZOLE MAGNESIUM 20 MG PO CPDR: 20.0000 mg | DELAYED_RELEASE_CAPSULE | Freq: Every day | ORAL | 3 refills | Status: DC

## 2020-02-13 MED ORDER — TAMSULOSIN HCL 0.4 MG PO CAPS: 0.4000 mg | ORAL_CAPSULE | Freq: Every day | ORAL | 3 refills | Status: DC

## 2020-02-13 NOTE — Progress Notes (Signed)
Subjective:    Patient ID: Henry Ellis, male    DOB: 29-Oct-1956, 64 y.o.   MRN: [id number]  HPI  Here for wellness and f/u;  Overall doing ok;  Pt denies Chest pain, worsening SOB, DOE, wheezing, orthopnea, PND, worsening LE edema, palpitations, dizziness or syncope.  Pt denies neurological change such as new headache, facial or extremity weakness.  Pt denies polydipsia, polyuria, or low sugar symptoms. Pt states overall good compliance with treatment and medications, good tolerability, and has been trying to follow appropriate diet.  Pt denies worsening depressive symptoms, suicidal ideation or panic. No fever, night sweats, wt loss, loss of appetite, or other constitutional symptoms.  Pt states good ability with ADL's, has low fall risk, home safety reviewed and adequate, no other significant changes in hearing or vision, and only occasionally active with exercise. S/p Neg prostate biopsy recent.   Wt Readings from Last 3 Encounters:  02/13/20 290 lb (131.5 kg)  08/01/19 283 lb (128.4 kg)  01/31/19 293 lb (132.9 kg)  Denies urinary symptoms such as dysuria, frequency, urgency, flank pain, hematuria or n/v, fever, chills.  Has had 2 recent left nosebleed.  Also recent constipation, asks to stop the elavil Past Medical History:  Diagnosis Date  . Anemia   . Anxiety   . Arthritis   . Barrett's esophagus   . Cancer ([place]) 10/17/2016   kidney cancer  . Carpal tunnel syndrome, right 04/21/2011  . Chronic bronchitis   . ED (erectile dysfunction)   . Esophageal reflux   . Esophageal stricture   . Essential hypertension 10/19/2015  . H/O adenomatous polyp of colon 06/05/2012  . Hiatal hernia   . High cholesterol   . History of Bell's palsy   . IBS (irritable bowel syndrome)   . Personal history of colonic polyps 04/15/2008 & 04/22/2012   TUBULAR ADENOMA  . Ulcerative esophagitis    Past Surgical History:  Procedure Laterality Date  . CARPAL TUNNEL RELEASE     left  . INGUINAL HERNIA  REPAIR     bilateral  . KNEE ARTHROSCOPY     right [name]  . ROBOT ASSISTED LAPAROSCOPIC NEPHRECTOMY Left 10/18/2016   Procedure: XI ROBOTIC ASSISTED LAPAROSCOPIC RADICAL NEPHRECTOMY;  Surgeon: Alexis Frock, MD;  Location: [facility];  Service: Urology;  Laterality: Left;    reports that he quit smoking about 3 years ago. His smoking use included cigarettes. He smoked 1.00 pack per day. He has never used smokeless tobacco. He reports current alcohol use of about 2.0 standard drinks of alcohol per week. He reports current drug use. Frequency: 2.00 times per week. Drug: Marijuana. family history includes Heart disease in his father; Hypertension in his brother; Lung cancer in his father. Allergies  Allergen Reactions  . Bee Venom Anaphylaxis  . Aspirin Nausea Only  . Amoxicillin Rash    Did PCN reaction causing immediate rash, facial/tongue/throat swelling, SOB or lightheadedness with hypotension: no Did PCN reaction causing severe rash involving mucus membranes or skin necrosis: no Did PCN reaction that required hospitalization : no Did pcn reaction occurring within the last 10 years: no If all of the above answers are "NO", then may proceed with Cephalosporin use. Pt states he can take low doses of amoxicillin, has taken for dental procedures without reaction   Current Outpatient Medications on File Prior to Visit  Medication Sig Dispense Refill  . Cholecalciferol (VITAMIN D3 PO) Take by mouth.    . docusate sodium (COLACE) 100 MG capsule  Take 100 mg by mouth daily.    . ferrous sulfate 325 (65 FE) MG tablet Take 325 mg by mouth daily with breakfast.    . finasteride (PROSCAR) 5 MG tablet Take 5 mg by mouth daily.    . Psyllium (METAMUCIL) 30.9 % POWD Take 1 packet by mouth daily.      No current facility-administered medications on file prior to visit.   Review of Systems All otherwise neg per pt     Objective:   Physical Exam BP 136/80   Pulse 62   Temp 98.2 F (36.8 C)    Ht 6\' 1"  (1.854 m)   Wt 290 lb (131.5 kg)   SpO2 99%   BMI 38.26 kg/m  VS noted,  Constitutional: Pt appears in NAD HENT: Head: NCAT.  Right Ear: External ear normal.  Left Ear: External ear normal.  Eyes: . Pupils are equal, round, and reactive to light. Conjunctivae and EOM are normal Nose: without d/c or deformity Neck: Neck supple. Gross normal ROM Cardiovascular: Normal rate and regular rhythm.   Pulmonary/Chest: Effort normal and breath sounds without rales or wheezing.  Abd:  Soft, NT, ND, + BS, no organomegaly Neurological: Pt is alert. At baseline orientation, motor grossly intact Skin: Skin is warm. No rashes, other new lesions, no LE edema Psychiatric: Pt behavior is normal without agitation  All otherwise neg per pt Lab Results  Component Value Date   WBC 6.5 02/13/2020   HGB 12.8 (L) 02/13/2020   HCT 38.3 (L) 02/13/2020   PLT 208.0 02/13/2020   GLUCOSE 91 02/13/2020   CHOL 145 02/13/2020   TRIG 50.0 02/13/2020   HDL 66.70 02/13/2020   LDLDIRECT 151.7 03/25/2010   LDLCALC 68 02/13/2020   ALT 16 02/13/2020   AST 14 02/13/2020   NA 141 02/13/2020   K 4.9 02/13/2020   CL 105 02/13/2020   CREATININE 1.59 (H) 02/13/2020   BUN 27 (H) 02/13/2020   CO2 30 02/13/2020   TSH 0.69 02/13/2020   PSA 3.70 02/13/2020   HGBA1C 5.9 02/13/2020   MICROALBUR 2.4 (H) 02/13/2020      Assessment & Plan:

## 2020-02-13 NOTE — Patient Instructions (Signed)
OK to stop the amytriptilene  Please continue all other medications as before, and refills have been done if requested.  Please have the pharmacy call with any other refills you may need.  Please continue your efforts at being more active, low cholesterol diet, and weight control.  You are otherwise up to date with prevention measures today.  Please keep your appointments with your specialists as you may have planned  Please go to the LAB at the blood drawing area for the tests to be done  You will be contacted by phone if any changes need to be made immediately.  Otherwise, you will receive a letter about your results with an explanation, but please check with MyChart first.  Please remember to sign up for MyChart if you have not done so, as this will be important to you in the future with finding out test results, communicating by private email, and scheduling acute appointments online when needed.  Please make an Appointment to return in 6 months, or sooner if needed, also with Lab Appointment for testing done 3-5 days before at the [facility] (so this is for TWO appointments - please see the scheduling desk as you leave)

## 2020-02-15 ENCOUNTER — Encounter: Payer: Self-pay | Admitting: Internal Medicine

## 2020-02-15 NOTE — Assessment & Plan Note (Signed)
Refer to ent 

## 2020-02-15 NOTE — Assessment & Plan Note (Signed)
stable overall by history and exam, recent data reviewed with pt, and pt to continue medical treatment as before,  to f/u any worsening symptoms or concerns  

## 2020-02-15 NOTE — Assessment & Plan Note (Signed)

## 2020-02-15 NOTE — Assessment & Plan Note (Signed)
For oral replacement 

## 2020-03-23 DIAGNOSIS — H524 Presbyopia: Secondary | ICD-10-CM | POA: Diagnosis not present

## 2020-07-05 ENCOUNTER — Ambulatory Visit (HOSPITAL_COMMUNITY)
Admission: RE | Admit: 2020-07-05 | Discharge: 2020-07-05 | Disposition: A | Discharge status: Home / Self Care | Payer: Medicare Other | Source: Ambulatory Visit | Attending: Urology | Admitting: Urology

## 2020-07-05 ENCOUNTER — Other Ambulatory Visit: Payer: Self-pay

## 2020-07-05 ENCOUNTER — Other Ambulatory Visit (HOSPITAL_COMMUNITY): Payer: Self-pay | Admitting: Urology

## 2020-07-05 DIAGNOSIS — C642 Malignant neoplasm of left kidney, except renal pelvis: Secondary | ICD-10-CM | POA: Diagnosis not present

## 2020-07-05 DIAGNOSIS — R972 Elevated prostate specific antigen [PSA]: Secondary | ICD-10-CM | POA: Diagnosis not present

## 2020-07-12 DIAGNOSIS — N281 Cyst of kidney, acquired: Secondary | ICD-10-CM | POA: Diagnosis not present

## 2020-07-12 DIAGNOSIS — C642 Malignant neoplasm of left kidney, except renal pelvis: Secondary | ICD-10-CM | POA: Diagnosis not present

## 2020-07-12 DIAGNOSIS — Z905 Acquired absence of kidney: Secondary | ICD-10-CM | POA: Diagnosis not present

## 2020-07-12 DIAGNOSIS — R972 Elevated prostate specific antigen [PSA]: Secondary | ICD-10-CM | POA: Diagnosis not present

## 2020-07-19 ENCOUNTER — Encounter: Payer: Self-pay | Admitting: Internal Medicine

## 2020-08-09 ENCOUNTER — Other Ambulatory Visit: Payer: Self-pay

## 2020-08-09 ENCOUNTER — Other Ambulatory Visit (INDEPENDENT_AMBULATORY_CARE_PROVIDER_SITE_OTHER): Payer: Medicare Other

## 2020-08-09 DIAGNOSIS — N183 Chronic kidney disease, stage 3 unspecified: Secondary | ICD-10-CM | POA: Diagnosis not present

## 2020-08-09 DIAGNOSIS — R7302 Impaired glucose tolerance (oral): Secondary | ICD-10-CM

## 2020-08-09 LAB — LIPID PANEL
Cholesterol: 127 mg/dL (ref 0–200)
HDL: 62.7 mg/dL (ref >39.00)
LDL Cholesterol: 56 mg/dL (ref 0–99)
NonHDL: 64.53
Total CHOL/HDL Ratio: 2
Triglycerides: 44 mg/dL (ref 0.0–149.0)
VLDL: 8.8 mg/dL (ref 0.0–40.0)

## 2020-08-09 LAB — HEPATIC FUNCTION PANEL
ALT: 15 U/L (ref 0–53)
AST: 14 U/L (ref 0–37)
Albumin: 4.1 g/dL (ref 3.5–5.2)
Alkaline Phosphatase: 69 U/L (ref 39–117)
Bilirubin, Direct: 0.1 mg/dL (ref 0.0–0.3)
Total Bilirubin: 0.5 mg/dL (ref 0.2–1.2)
Total Protein: 7.2 g/dL (ref 6.0–8.3)

## 2020-08-09 LAB — BASIC METABOLIC PANEL
BUN: 22 mg/dL (ref 6–23)
CO2: 29 mEq/L (ref 19–32)
Calcium: 9.2 mg/dL (ref 8.4–10.5)
Chloride: 103 mEq/L (ref 96–112)
Creatinine, Ser: 1.44 mg/dL (ref 0.40–1.50)
GFR: 49.05 mL/min — ABNORMAL LOW (ref >60.00)
Glucose, Bld: 86 mg/dL (ref 70–99)
Potassium: 4.8 mEq/L (ref 3.5–5.1)
Sodium: 140 mEq/L (ref 135–145)

## 2020-08-09 LAB — HEMOGLOBIN A1C: Hgb A1c MFr Bld: 6 % (ref 4.6–6.5)

## 2020-08-13 ENCOUNTER — Ambulatory Visit: Payer: Medicare Other | Admitting: Internal Medicine

## 2020-08-20 ENCOUNTER — Ambulatory Visit (INDEPENDENT_AMBULATORY_CARE_PROVIDER_SITE_OTHER): Payer: Medicare Other | Admitting: Internal Medicine

## 2020-08-20 ENCOUNTER — Other Ambulatory Visit: Payer: Self-pay

## 2020-08-20 ENCOUNTER — Encounter: Payer: Self-pay | Admitting: Internal Medicine

## 2020-08-20 VITALS — BP 120/80 | HR 66 | Temp 98.1°F | Ht 73.0 in | Wt 299.0 lb

## 2020-08-20 DIAGNOSIS — N183 Chronic kidney disease, stage 3 unspecified: Secondary | ICD-10-CM | POA: Diagnosis not present

## 2020-08-20 DIAGNOSIS — R609 Edema, unspecified: Secondary | ICD-10-CM | POA: Diagnosis not present

## 2020-08-20 DIAGNOSIS — R7302 Impaired glucose tolerance (oral): Secondary | ICD-10-CM

## 2020-08-20 DIAGNOSIS — I10 Essential (primary) hypertension: Secondary | ICD-10-CM

## 2020-08-20 DIAGNOSIS — R600 Localized edema: Secondary | ICD-10-CM | POA: Insufficient documentation

## 2020-08-20 DIAGNOSIS — E7800 Pure hypercholesterolemia, unspecified: Secondary | ICD-10-CM

## 2020-08-20 MED ORDER — FUROSEMIDE 20 MG PO TABS: 20.0000 mg | ORAL_TABLET | Freq: Every day | ORAL | 3 refills | Status: DC | PRN

## 2020-08-20 NOTE — Progress Notes (Signed)
Subjective:    Patient ID: Henry Ellis, male    DOB: [birth year], 64 y.o.   MRN: [id number]  HPI  Here to f/u; overall doing ok,  Pt denies chest pain, increasing sob or doe, wheezing, orthopnea, PND, palpitations, dizziness or syncope, but has had increased LE swelling in the past several months, worse with legs dependent, better in the AM to get OOB.  Sees renal as well, had recent labs not available today  Pt denies new neurological symptoms such as new headache, or facial or extremity weakness or numbness.  Pt denies polydipsia, polyuria, or low sugar episode.  Pt states overall good compliance with meds, mostly trying to follow appropriate diet, with wt overall stable.  Has been drinking more ETOH recently up to 3 beers per evening Past Medical History:  Diagnosis Date  . Anemia   . Anxiety   . Arthritis   . Barrett's esophagus   . Cancer ([place]) 10/17/2016   kidney cancer  . Carpal tunnel syndrome, right 04/21/2011  . Chronic bronchitis   . ED (erectile dysfunction)   . Esophageal reflux   . Esophageal stricture   . Essential hypertension 10/19/2015  . H/O adenomatous polyp of colon 06/05/2012  . Hiatal hernia   . High cholesterol   . History of Bell's palsy   . IBS (irritable bowel syndrome)   . Personal history of colonic polyps 04/15/2008 & 04/22/2012   TUBULAR ADENOMA  . Ulcerative esophagitis    Past Surgical History:  Procedure Laterality Date  . CARPAL TUNNEL RELEASE     left  . INGUINAL HERNIA REPAIR     bilateral  . KNEE ARTHROSCOPY     right [name]  . ROBOT ASSISTED LAPAROSCOPIC NEPHRECTOMY Left 10/18/2016   Procedure: XI ROBOTIC ASSISTED LAPAROSCOPIC RADICAL NEPHRECTOMY;  Surgeon: Alexis Frock, MD;  Location: [facility];  Service: Urology;  Laterality: Left;    reports that he quit smoking about 3 years ago. His smoking use included cigarettes. He smoked 1.00 pack per day. He has never used smokeless tobacco. He reports current alcohol use of about 2.0 standard  drinks of alcohol per week. He reports current drug use. Frequency: 2.00 times per week. Drug: Marijuana. family history includes Heart disease in his father; Hypertension in his brother; Lung cancer in his father. Allergies  Allergen Reactions  . Bee Venom Anaphylaxis  . Aspirin Nausea Only  . Amoxicillin Rash    Did PCN reaction causing immediate rash, facial/tongue/throat swelling, SOB or lightheadedness with hypotension: no Did PCN reaction causing severe rash involving mucus membranes or skin necrosis: no Did PCN reaction that required hospitalization : no Did pcn reaction occurring within the last 10 years: no If all of the above answers are "NO", then may proceed with Cephalosporin use. Pt states he can take low doses of amoxicillin, has taken for dental procedures without reaction   Current Outpatient Medications on File Prior to Visit  Medication Sig Dispense Refill  . amitriptyline (ELAVIL) 10 MG tablet Take 10 mg by mouth at bedtime.    Marland Kitchen amLODipine (NORVASC) 2.5 MG tablet Take 1 tablet (2.5 mg total) by mouth daily. 90 tablet 3  . atorvastatin (LIPITOR) 20 MG tablet TAKE 1 TABLET(20 MG) BY MOUTH DAILY AT 6 PM 90 tablet 3  . Cholecalciferol (VITAMIN D3 PO) Take by mouth.    . docusate sodium (COLACE) 100 MG capsule Take 100 mg by mouth daily.    Marland Kitchen esomeprazole (NEXIUM) 20 MG capsule Take 1  capsule (20 mg total) by mouth daily at 12 noon. 90 capsule 3  . ferrous sulfate 325 (65 FE) MG tablet Take 325 mg by mouth daily with breakfast.    . finasteride (PROSCAR) 5 MG tablet Take 5 mg by mouth daily.    . Psyllium (METAMUCIL) 30.9 % POWD Take 1 packet by mouth daily.     . tamsulosin (FLOMAX) 0.4 MG CAPS capsule Take 1 capsule (0.4 mg total) by mouth daily. 90 capsule 3   No current facility-administered medications on file prior to visit.   Review of Systems All otherwise neg per pt    Objective:   Physical Exam BP 120/80 (BP Location: Left Arm, Patient Position: Sitting,  Cuff Size: Large)   Pulse 66   Temp 98.1 F (36.7 C) (Oral)   Ht 6\' 1"  (1.854 m)   Wt 299 lb (135.6 kg)   SpO2 96%   BMI 39.45 kg/m  VS noted,  Constitutional: Pt appears in NAD HENT: Head: NCAT.  Right Ear: External ear normal.  Left Ear: External ear normal.  Eyes: . Pupils are equal, round, and reactive to light. Conjunctivae and EOM are normal Nose: without d/c or deformity Neck: Neck supple. Gross normal ROM Cardiovascular: Normal rate and regular rhythm.   Pulmonary/Chest: Effort normal and breath sounds without rales or wheezing.  Abd:  Soft, NT, ND, + BS, no organomegaly Neurological: Pt is alert. At baseline orientation, motor grossly intact Skin: Skin is warm. No rashes, other new lesions, 1+ bilat LE edema Psychiatric: Pt behavior is normal without agitation  All otherwise neg per pt Lab Results  Component Value Date   WBC 6.5 02/13/2020   HGB 12.8 (L) 02/13/2020   HCT 38.3 (L) 02/13/2020   PLT 208.0 02/13/2020   GLUCOSE 86 08/09/2020   CHOL 127 08/09/2020   TRIG 44.0 08/09/2020   HDL 62.70 08/09/2020   LDLDIRECT 151.7 03/25/2010   LDLCALC 56 08/09/2020   ALT 15 08/09/2020   AST 14 08/09/2020   NA 140 08/09/2020   K 4.8 08/09/2020   CL 103 08/09/2020   CREATININE 1.44 08/09/2020   BUN 22 08/09/2020   CO2 29 08/09/2020   TSH 0.69 02/13/2020   PSA 3.70 02/13/2020   HGBA1C 6.0 08/09/2020   MICROALBUR 2.4 (H) 02/13/2020          Assessment & Plan:

## 2020-08-20 NOTE — Assessment & Plan Note (Signed)
For BNP, and lasix 20 qd prn

## 2020-08-20 NOTE — Patient Instructions (Addendum)
Please take all new medication as prescribed - the lasix 20 mg per day  Please continue all other medications as before, and refills have been done if requested.  Please have the pharmacy call with any other refills you may need.  Please continue your efforts at being more active, low cholesterol diet, and weight control..  Please keep your appointments with your specialists as you may have planned  Please go to the LAB at the blood drawing area for the tests to be done - just the BNP today  We will forward the labs to Dr Annie Sable  Please make an Appointment to return in 6 months, or sooner if needed, also with Lab Appointment for testing done 3-5 days before at the [facility] (so this is for TWO appointments - please see the scheduling desk as you leave)

## 2020-08-21 ENCOUNTER — Encounter: Payer: Self-pay | Admitting: Internal Medicine

## 2020-08-21 LAB — BRAIN NATRIURETIC PEPTIDE: Brain Natriuretic Peptide: 96 pg/mL (ref <100)

## 2020-08-22 ENCOUNTER — Encounter: Payer: Self-pay | Admitting: Internal Medicine

## 2020-08-22 NOTE — Assessment & Plan Note (Signed)
stable overall by history and exam, recent data reviewed with pt, and pt to continue medical treatment as before,  to f/u any worsening symptoms or concerns  

## 2020-08-22 NOTE — Assessment & Plan Note (Addendum)
stable overall by history and exam, recent data reviewed with pt, and pt to continue medical treatment as before,  to f/u any worsening symptoms or concerns, for f/u lab with renal and f/u renal as planned  I spent 31 minutes in preparing to see the patient by review of recent labs, imaging and procedures, obtaining and reviewing separately obtained history, communicating with the patient and family or caregiver, ordering medications, tests or procedures, and documenting clinical information in the EHR including the differential Dx, treatment, and any further evaluation and other management of ckd, htn, hyperglycemia, periph edema, hld

## 2020-08-30 ENCOUNTER — Telehealth: Payer: Self-pay | Admitting: Internal Medicine

## 2020-08-30 NOTE — Telephone Encounter (Signed)
This is noted, but as lasix 99.999999999999% does not cause this, we do not need to change the lasix.  He may need further OV to manage the swelling if it is getting worse or simply not better, or the weight is increasing with the swelling

## 2020-08-30 NOTE — Telephone Encounter (Signed)
Sent to Dr. John. 

## 2020-08-30 NOTE — Telephone Encounter (Signed)
furosemide (LASIX) 20 MG tablet  Patient had a reaction, stomach pain and itching   Patient would like to prescribe something else... Legs are still swelling  [facility] [place], [address] [place] Phone:  [phone]  Fax:  [phone]

## 2020-08-31 NOTE — Telephone Encounter (Signed)
Called pt, LVM with details below  

## 2020-09-09 ENCOUNTER — Other Ambulatory Visit: Payer: Self-pay

## 2020-09-09 ENCOUNTER — Ambulatory Visit (AMBULATORY_SURGERY_CENTER): Payer: Self-pay

## 2020-09-09 VITALS — Ht 73.0 in | Wt 297.0 lb

## 2020-09-09 DIAGNOSIS — Z86010 Personal history of colonic polyps: Secondary | ICD-10-CM

## 2020-09-09 MED ORDER — GOLYTELY 236 G PO SOLR: 4000.0000 mL | ORAL | 0 refills | Status: DC

## 2020-09-09 NOTE — Progress Notes (Signed)
No egg or soy allergy known to patient  No issues with past sedation with any surgeries or procedures No intubation problems in the past  No FH of Malignant Hyperthermia No diet pills per patient No home 02 use per patient  No blood thinners per patient  Pt denies issues with constipation at this time- takes Colace daily No A fib or A flutter  EMMI video via [facility] 19 guidelines implemented in PV today with Pt and RN   COVID vaccines completed on 01/2020 per pt;  Due to the COVID-19 pandemic we are asking patients to follow these guidelines. Please only bring one care partner. Please be aware that your care partner may wait in the car in the parking lot or if they feel like they will be too hot to wait in the car, they may wait in the lobby on the 4th floor. All care partners are required to wear a mask the entire time (we do not have any that we can provide them), they need to practice social distancing, and we will do a Covid check for all patient's and care partners when you arrive. Also we will check their temperature and your temperature. If the care partner waits in their car they need to stay in the parking lot the entire time and we will call them on their cell phone when the patient is ready for discharge so they can bring the car to the front of the building. Also all patient's will need to wear a mask into building.

## 2020-09-10 ENCOUNTER — Encounter: Payer: Self-pay | Admitting: Internal Medicine

## 2020-09-16 ENCOUNTER — Other Ambulatory Visit: Payer: Self-pay

## 2020-09-16 ENCOUNTER — Encounter: Payer: Self-pay | Admitting: Internal Medicine

## 2020-09-16 ENCOUNTER — Ambulatory Visit (INDEPENDENT_AMBULATORY_CARE_PROVIDER_SITE_OTHER): Payer: Medicare Other | Admitting: Internal Medicine

## 2020-09-16 VITALS — BP 130/80 | HR 67 | Temp 98.2°F | Ht 73.0 in | Wt 294.0 lb

## 2020-09-16 DIAGNOSIS — R7302 Impaired glucose tolerance (oral): Secondary | ICD-10-CM

## 2020-09-16 DIAGNOSIS — N1831 Chronic kidney disease, stage 3a: Secondary | ICD-10-CM | POA: Diagnosis not present

## 2020-09-16 DIAGNOSIS — E7800 Pure hypercholesterolemia, unspecified: Secondary | ICD-10-CM | POA: Diagnosis not present

## 2020-09-16 DIAGNOSIS — Z23 Encounter for immunization: Secondary | ICD-10-CM

## 2020-09-16 DIAGNOSIS — R609 Edema, unspecified: Secondary | ICD-10-CM

## 2020-09-16 DIAGNOSIS — I10 Essential (primary) hypertension: Secondary | ICD-10-CM

## 2020-09-16 DIAGNOSIS — R600 Localized edema: Secondary | ICD-10-CM

## 2020-09-16 NOTE — Patient Instructions (Addendum)
You had the flu shot today  Please call if you would want to the the mild fluid pill -  HCT 12.5 mg per day  Please avoid extra salt, wear the compression stockings when awake,and keep legs elevated if you can with sitting  Please continue all other medications as before, and refills have been done if requested.  Please have the pharmacy call with any other refills you may need.  Please continue your efforts at being more active, low cholesterol diet, and weight control.  Please keep your appointments with your specialists as you may have planned

## 2020-09-16 NOTE — Progress Notes (Signed)
Subjective:    Patient ID: Henry Ellis, male    DOB: [birth year], 64 y.o.   MRN: [id number]  HPI  Here to f/u; overall doing ok,  Pt denies chest pain, increasing sob or doe, wheezing, orthopnea, PND,  palpitations, dizziness or syncope.  Pt denies new neurological symptoms such as new headache, or facial or extremity weakness or numbness.  Pt denies polydipsia, polyuria, or low sugar episode.  Pt states overall good compliance with meds, mostly trying to follow appropriate diet, with wt overall stable,  but little exercise however.  Quit ETOH x 2 wks. Has had some increased trace swelling to the legs in this time with increased fluids, goes away at night, back in the am.   Past Medical History:  Diagnosis Date  . Allergy    seasonal allergies  . Anemia    on meds  . Anxiety    hx of  . Arthritis    generalized  . Barrett's esophagus   . Cancer ([place]) 10/17/2016   kidney cancer(removed left kidney in 2017)  . Carpal tunnel syndrome, right 04/21/2011  . Chronic bronchitis   . Chronic kidney disease 2017   2017-had left kidney removed  . ED (erectile dysfunction)   . Esophageal reflux    on meds  . Esophageal stricture   . Essential hypertension 10/19/2015   on meds  . H/O adenomatous polyp of colon 06/05/2012  . Hiatal hernia   . High cholesterol    on meds  . History of Bell's palsy   . IBS (irritable bowel syndrome)   . Personal history of colonic polyps 04/15/2008 & 04/22/2012   TUBULAR ADENOMA  . Ulcerative esophagitis    Past Surgical History:  Procedure Laterality Date  . CARPAL TUNNEL RELEASE     left  . INGUINAL HERNIA REPAIR     bilateral  . KNEE ARTHROSCOPY     right [name]  . ROBOT ASSISTED LAPAROSCOPIC NEPHRECTOMY Left 10/18/2016   Procedure: XI ROBOTIC ASSISTED LAPAROSCOPIC RADICAL NEPHRECTOMY;  Surgeon: Alexis Frock, MD;  Location: [facility];  Service: Urology;  Laterality: Left;  . WISDOM TOOTH EXTRACTION      reports that he quit smoking about 3  years ago. His smoking use included cigarettes. He smoked 1.00 pack per day. He has never used smokeless tobacco. He reports current alcohol use of about 3.0 standard drinks of alcohol per week. He reports previous drug use. Frequency: 2.00 times per week. Drug: Marijuana. family history includes Heart disease in his father; Hypertension in his brother; Lung cancer in his father. Allergies  Allergen Reactions  . Bee Venom Anaphylaxis  . Aspirin Nausea Only  . Amoxicillin Rash    Did PCN reaction causing immediate rash, facial/tongue/throat swelling, SOB or lightheadedness with hypotension: no Did PCN reaction causing severe rash involving mucus membranes or skin necrosis: no Did PCN reaction that required hospitalization : no Did pcn reaction occurring within the last 10 years: no If all of the above answers are "NO", then may proceed with Cephalosporin use. Pt states he can take low doses of amoxicillin, has taken for dental procedures without reaction   Current Outpatient Medications on File Prior to Visit  Medication Sig Dispense Refill  . amLODipine (NORVASC) 2.5 MG tablet Take 1 tablet (2.5 mg total) by mouth daily. 90 tablet 3  . atorvastatin (LIPITOR) 20 MG tablet TAKE 1 TABLET(20 MG) BY MOUTH DAILY AT 6 PM 90 tablet 3  . Cholecalciferol (VITAMIN D3 PO) Take  by mouth.    . docusate sodium (COLACE) 100 MG capsule Take 100 mg by mouth daily.    Marland Kitchen esomeprazole (NEXIUM) 20 MG capsule Take 1 capsule (20 mg total) by mouth daily at 12 noon. 90 capsule 3  . ferrous sulfate 325 (65 FE) MG tablet Take 325 mg by mouth daily with breakfast.    . finasteride (PROSCAR) 5 MG tablet Take 5 mg by mouth daily.    . polyethylene glycol (GOLYTELY) 236 g solution Take 4,000 mLs by mouth as directed. 4000 mL 0  . Psyllium (METAMUCIL) 30.9 % POWD Take 1 packet by mouth daily.     . tamsulosin (FLOMAX) 0.4 MG CAPS capsule Take 1 capsule (0.4 mg total) by mouth daily. 90 capsule 3   No current  facility-administered medications on file prior to visit.   Review of Systems All otherwise neg per pt    Objective:   Physical Exam BP 130/80 (BP Location: Left Arm, Patient Position: Sitting, Cuff Size: Large)   Pulse 67   Temp 98.2 F (36.8 C) (Oral)   Ht 6\' 1"  (1.854 m)   Wt 294 lb (133.4 kg)   SpO2 96%   BMI 38.79 kg/m  VS noted,  Constitutional: Pt appears in NAD HENT: Head: NCAT.  Right Ear: External ear normal.  Left Ear: External ear normal.  Eyes: . Pupils are equal, round, and reactive to light. Conjunctivae and EOM are normal Nose: without d/c or deformity Neck: Neck supple. Gross normal ROM Cardiovascular: Normal rate and regular rhythm.   Pulmonary/Chest: Effort normal and breath sounds without rales or wheezing.  Abd:  Soft, NT, ND, + BS, no organomegaly Neurological: Pt is alert. At baseline orientation, motor grossly intact Skin: Skin is warm. No rashes, other new lesions, no LE edema Psychiatric: Pt behavior is normal without agitation  All otherwise neg per pt Lab Results  Component Value Date   WBC 6.5 02/13/2020   HGB 12.8 (L) 02/13/2020   HCT 38.3 (L) 02/13/2020   PLT 208.0 02/13/2020   GLUCOSE 86 08/09/2020   CHOL 127 08/09/2020   TRIG 44.0 08/09/2020   HDL 62.70 08/09/2020   LDLDIRECT 151.7 03/25/2010   LDLCALC 56 08/09/2020   ALT 15 08/09/2020   AST 14 08/09/2020   NA 140 08/09/2020   K 4.8 08/09/2020   CL 103 08/09/2020   CREATININE 1.44 08/09/2020   BUN 22 08/09/2020   CO2 29 08/09/2020   TSH 0.69 02/13/2020   PSA 3.70 02/13/2020   HGBA1C 6.0 08/09/2020   MICROALBUR 2.4 (H) 02/13/2020      Assessment & Plan:

## 2020-09-17 DIAGNOSIS — I129 Hypertensive chronic kidney disease with stage 1 through stage 4 chronic kidney disease, or unspecified chronic kidney disease: Secondary | ICD-10-CM | POA: Diagnosis not present

## 2020-09-17 DIAGNOSIS — N183 Chronic kidney disease, stage 3 unspecified: Secondary | ICD-10-CM | POA: Diagnosis not present

## 2020-09-18 ENCOUNTER — Encounter: Payer: Self-pay | Admitting: Internal Medicine

## 2020-09-18 NOTE — Assessment & Plan Note (Signed)
stable overall by history and exam, recent data reviewed with pt, and pt to continue medical treatment as before,  to f/u any worsening symptoms or concerns  

## 2020-09-18 NOTE — Assessment & Plan Note (Signed)
stable overall by history and exam, recent data reviewed with pt, and pt to continue medical treatment as before,  to f/u any worsening symptoms or concerns, for f/u lab 

## 2020-09-18 NOTE — Assessment & Plan Note (Addendum)

## 2020-09-18 NOTE — Assessment & Plan Note (Addendum)
Mild, c/w venous insufficiency, for compression stockings, low salt diet, leg elevation, and hct 12.5 qd

## 2020-09-23 ENCOUNTER — Encounter: Payer: Self-pay | Admitting: Internal Medicine

## 2020-09-23 ENCOUNTER — Ambulatory Visit (AMBULATORY_SURGERY_CENTER): Payer: Medicare Other | Admitting: Internal Medicine

## 2020-09-23 ENCOUNTER — Other Ambulatory Visit: Payer: Self-pay

## 2020-09-23 ENCOUNTER — Encounter: Payer: Medicare Other | Admitting: Internal Medicine

## 2020-09-23 VITALS — BP 124/77 | HR 56 | Temp 97.1°F | Resp 15 | Ht 73.0 in | Wt 297.0 lb

## 2020-09-23 DIAGNOSIS — N183 Chronic kidney disease, stage 3 unspecified: Secondary | ICD-10-CM | POA: Diagnosis not present

## 2020-09-23 DIAGNOSIS — I129 Hypertensive chronic kidney disease with stage 1 through stage 4 chronic kidney disease, or unspecified chronic kidney disease: Secondary | ICD-10-CM | POA: Diagnosis not present

## 2020-09-23 DIAGNOSIS — D128 Benign neoplasm of rectum: Secondary | ICD-10-CM

## 2020-09-23 DIAGNOSIS — D124 Benign neoplasm of descending colon: Secondary | ICD-10-CM | POA: Diagnosis not present

## 2020-09-23 DIAGNOSIS — Z86010 Personal history of colonic polyps: Secondary | ICD-10-CM | POA: Diagnosis not present

## 2020-09-23 DIAGNOSIS — K621 Rectal polyp: Secondary | ICD-10-CM | POA: Diagnosis not present

## 2020-09-23 DIAGNOSIS — D123 Benign neoplasm of transverse colon: Secondary | ICD-10-CM

## 2020-09-23 MED ORDER — SODIUM CHLORIDE 0.9 % IV SOLN: 500.0000 mL | INTRAVENOUS | Status: DC

## 2020-09-23 NOTE — Op Note (Signed)
[place] Patient Name: Henry Ellis Procedure Date: 09/23/2020 11:10 AM MRN: [id number] Endoscopist: Jerene Bears , MD Age: 64 Referring MD:  Date of Birth: 05/11/1956 Gender: Male Account #: [id number] Procedure:                Colonoscopy Indications:              High risk colon cancer surveillance: Personal                            history of multiple (3 or more) adenomas, Last                            colonoscopy: July 2018 Medicines:                Monitored Anesthesia Care Procedure:                Pre-Anesthesia Assessment:                           - Prior to the procedure, a History and Physical                            was performed, and patient medications and                            allergies were reviewed. The patient's tolerance of                            previous anesthesia was also reviewed. The risks                            and benefits of the procedure and the sedation                            options and risks were discussed with the patient.                            All questions were answered, and informed consent                            was obtained. Prior Anticoagulants: The patient has                            taken no previous anticoagulant or antiplatelet                            agents. ASA Grade Assessment: II - A patient with                            mild systemic disease. After reviewing the risks                            and benefits, the patient was deemed in  satisfactory condition to undergo the procedure.                           After obtaining informed consent, the colonoscope                            was passed under direct vision. Throughout the                            procedure, the patient's blood pressure, pulse, and                            oxygen saturations were monitored continuously. The                            Colonoscope was introduced through the anus and                             advanced to the cecum, identified by appendiceal                            orifice and ileocecal valve. The colonoscopy was                            performed without difficulty. The patient tolerated                            the procedure well. The quality of the bowel                            preparation was good. The ileocecal valve,                            appendiceal orifice, and rectum were photographed. Scope In: 11:14:59 AM Scope Out: 11:29:58 AM Scope Withdrawal Time: 0 hours 13 minutes 40 seconds  Total Procedure Duration: 0 hours 14 minutes 59 seconds  Findings:                 The digital rectal exam was normal.                           A 4 mm polyp was found in the transverse colon. The                            polyp was sessile. The polyp was removed with a                            cold snare. Resection and retrieval were complete.                           Two sessile polyps were found in the descending                            colon. The polyps were 3  to 4 mm in size. These                            polyps were removed with a cold snare. Resection                            and retrieval were complete.                           A 3 mm polyp was found in the distal rectum. The                            polyp was sessile. The polyp was removed with a                            cold snare. Resection and retrieval were complete.                           Multiple small-mouthed diverticula were found in                            the sigmoid colon.                           Internal hemorrhoids were found during                            retroflexion. The hemorrhoids were small. Complications:            No immediate complications. Estimated Blood Loss:     Estimated blood loss was minimal. Impression:               - One 4 mm polyp in the transverse colon, removed                            with a cold snare. Resected and  retrieved.                           - Two 3 to 4 mm polyps in the descending colon,                            removed with a cold snare. Resected and retrieved.                           - One 3 mm polyp in the distal rectum, removed with                            a cold snare. Resected and retrieved.                           - Diverticulosis in the sigmoid colon.                           - Internal hemorrhoids. Recommendation:           -  Patient has a contact number available for                            emergencies. The signs and symptoms of potential                            delayed complications were discussed with the                            patient. Return to normal activities tomorrow.                            Written discharge instructions were provided to the                            patient.                           - Resume previous diet.                           - Continue present medications.                           - Await pathology results.                           - Repeat colonoscopy is recommended for                            surveillance. The colonoscopy date will be                            determined after pathology results from today's                            exam become available for review. Jerene Bears, MD 09/23/2020 11:33:15 AM This report has been signed electronically.

## 2020-09-23 NOTE — Progress Notes (Signed)
Pt's states no medical or surgical changes since previsit or office visit. 

## 2020-09-23 NOTE — Progress Notes (Signed)
Called to room to assist during endoscopic procedure.  Patient ID and intended procedure confirmed with present staff. Received instructions for my participation in the procedure from the performing physician.  

## 2020-09-23 NOTE — Patient Instructions (Signed)
Handouts provided on polyps, diverticulosis and hemorrhoids.   YOU HAD AN ENDOSCOPIC PROCEDURE TODAY AT THE [facility]:   Refer to the procedure report that was given to you for any specific questions about what was found during the examination.  If the procedure report does not answer your questions, please call your gastroenterologist to clarify.  If you requested that your care partner not be given the details of your procedure findings, then the procedure report has been included in a sealed envelope for you to review at your convenience later.  YOU SHOULD EXPECT: Some feelings of bloating in the abdomen. Passage of more gas than usual.  Walking can help get rid of the air that was put into your GI tract during the procedure and reduce the bloating. If you had a lower endoscopy (such as a colonoscopy or flexible sigmoidoscopy) you may notice spotting of blood in your stool or on the toilet paper. If you underwent a bowel prep for your procedure, you may not have a normal bowel movement for a few days.  Please Note:  You might notice some irritation and congestion in your nose or some drainage.  This is from the oxygen used during your procedure.  There is no need for concern and it should clear up in a day or so.  SYMPTOMS TO REPORT IMMEDIATELY:   Following lower endoscopy (colonoscopy or flexible sigmoidoscopy):  Excessive amounts of blood in the stool  Significant tenderness or worsening of abdominal pains  Swelling of the abdomen that is new, acute  Fever of 100F or higher   For urgent or emergent issues, a gastroenterologist can be reached at any hour by calling [phone]. Do not use MyChart messaging for urgent concerns.    DIET:  We do recommend a small meal at first, but then you may proceed to your regular diet.  Drink plenty of fluids but you should avoid alcoholic beverages for 24 hours.  ACTIVITY:  You should plan to take it easy for the rest of today and  you should NOT DRIVE or use heavy machinery until tomorrow (because of the sedation medicines used during the test).    FOLLOW UP: Our staff will call the number listed on your records 48-72 hours following your procedure to check on you and address any questions or concerns that you may have regarding the information given to you following your procedure. If we do not reach you, we will leave a message.  We will attempt to reach you two times.  During this call, we will ask if you have developed any symptoms of COVID 19. If you develop any symptoms (ie: fever, flu-like symptoms, shortness of breath, cough etc.) before then, please call [phone].  If you test positive for Covid 19 in the 2 weeks post procedure, please call and report this information to us.    If any biopsies were taken you will be contacted by phone or by letter within the next 1-3 weeks.  Please call us at [phone] if you have not heard about the biopsies in 3 weeks.    SIGNATURES/CONFIDENTIALITY: You and/or your care partner have signed paperwork which will be entered into your electronic medical record.  These signatures attest to the fact that that the information above on your After Visit Summary has been reviewed and is understood.  Full responsibility of the confidentiality of this discharge information lies with you and/or your care-partner.  

## 2020-09-23 NOTE — Progress Notes (Signed)
Report to PACU, RN, vss, BBS= Clear.  

## 2020-09-27 ENCOUNTER — Telehealth: Payer: Self-pay

## 2020-09-27 NOTE — Telephone Encounter (Signed)
  Follow up Call-  Call back number 09/23/2020  Post procedure Call Back phone  # [phone]  Permission to leave phone message Yes  Some recent data might be hidden     Patient questions:  Do you have a fever, pain , or abdominal swelling? No. Pain Score  0 *  Have you tolerated food without any problems? Yes.    Have you been able to return to your normal activities? Yes.    Do you have any questions about your discharge instructions: Diet   No. Medications  No. Follow up visit  No.  Do you have questions or concerns about your Care? No.  Actions: * If pain score is 4 or above: No action needed, pain <4.  Have you developed a fever since your procedure? No 2.   Have you had an respiratory symptoms (SOB or cough) since your procedure? No  3.   Have you tested positive for COVID 19 since your procedure No  4.   Have you had any family members/close contacts diagnosed with the COVID 19 since your procedure?  No   If yes to any of these questions please route to Joylene John, RN and Joella Prince, RN

## 2020-09-28 ENCOUNTER — Encounter: Payer: Self-pay | Admitting: Internal Medicine

## 2020-09-30 DIAGNOSIS — H5213 Myopia, bilateral: Secondary | ICD-10-CM | POA: Diagnosis not present

## 2020-09-30 DIAGNOSIS — H52223 Regular astigmatism, bilateral: Secondary | ICD-10-CM | POA: Diagnosis not present

## 2020-09-30 DIAGNOSIS — H40059 Ocular hypertension, unspecified eye: Secondary | ICD-10-CM | POA: Diagnosis not present

## 2020-09-30 DIAGNOSIS — H524 Presbyopia: Secondary | ICD-10-CM | POA: Diagnosis not present

## 2020-10-01 ENCOUNTER — Ambulatory Visit (INDEPENDENT_AMBULATORY_CARE_PROVIDER_SITE_OTHER)
Admission: RE | Admit: 2020-10-01 | Discharge: 2020-10-01 | Disposition: A | Discharge status: Home / Self Care | Payer: Medicare Other | Source: Ambulatory Visit | Attending: Acute Care | Admitting: Acute Care

## 2020-10-01 ENCOUNTER — Other Ambulatory Visit: Payer: Self-pay

## 2020-10-01 DIAGNOSIS — Z87891 Personal history of nicotine dependence: Secondary | ICD-10-CM

## 2020-10-01 DIAGNOSIS — Z122 Encounter for screening for malignant neoplasm of respiratory organs: Secondary | ICD-10-CM

## 2020-10-07 NOTE — Progress Notes (Signed)
Please call patient and let them  know their  low dose Ct was read as a Lung RADS 2: nodules that are benign in appearance and behavior with a very low likelihood of becoming a clinically active cancer due to size or lack of growth. Recommendation per radiology is for a repeat LDCT in 12 months. .Please let them  know we will order and schedule their  annual screening scan for 09/2021. Please let them  know there was notation of CAD on their  scan.  Please remind the patient  that this is a non-gated exam therefore degree or severity of disease  cannot be determined. Please have them  follow up with their PCP regarding potential risk factor modification, dietary therapy or pharmacologic therapy if clinically indicated. Pt.  is  currently on statin therapy. Please place order for annual  screening scan for  09/2021 and fax results to PCP. Thanks so much. 

## 2020-10-08 ENCOUNTER — Other Ambulatory Visit: Payer: Self-pay | Admitting: *Deleted

## 2020-10-08 DIAGNOSIS — Z87891 Personal history of nicotine dependence: Secondary | ICD-10-CM

## 2020-12-23 DIAGNOSIS — L600 Ingrowing nail: Secondary | ICD-10-CM | POA: Diagnosis not present

## 2020-12-23 DIAGNOSIS — M79675 Pain in left toe(s): Secondary | ICD-10-CM | POA: Diagnosis not present

## 2020-12-23 DIAGNOSIS — B351 Tinea unguium: Secondary | ICD-10-CM | POA: Diagnosis not present

## 2020-12-23 DIAGNOSIS — M79674 Pain in right toe(s): Secondary | ICD-10-CM | POA: Diagnosis not present

## 2021-01-07 DIAGNOSIS — M7751 Other enthesopathy of right foot: Secondary | ICD-10-CM | POA: Diagnosis not present

## 2021-01-07 DIAGNOSIS — M21611 Bunion of right foot: Secondary | ICD-10-CM | POA: Diagnosis not present

## 2021-01-07 DIAGNOSIS — M25571 Pain in right ankle and joints of right foot: Secondary | ICD-10-CM | POA: Diagnosis not present

## 2021-01-21 ENCOUNTER — Telehealth: Payer: Self-pay | Admitting: Internal Medicine

## 2021-01-21 DIAGNOSIS — N1831 Chronic kidney disease, stage 3a: Secondary | ICD-10-CM

## 2021-01-21 DIAGNOSIS — R7302 Impaired glucose tolerance (oral): Secondary | ICD-10-CM

## 2021-01-21 DIAGNOSIS — E538 Deficiency of other specified B group vitamins: Secondary | ICD-10-CM

## 2021-01-21 DIAGNOSIS — D509 Iron deficiency anemia, unspecified: Secondary | ICD-10-CM

## 2021-01-21 DIAGNOSIS — E7800 Pure hypercholesterolemia, unspecified: Secondary | ICD-10-CM

## 2021-01-21 DIAGNOSIS — R972 Elevated prostate specific antigen [PSA]: Secondary | ICD-10-CM

## 2021-01-21 DIAGNOSIS — E559 Vitamin D deficiency, unspecified: Secondary | ICD-10-CM

## 2021-01-21 DIAGNOSIS — Z0000 Encounter for general adult medical examination without abnormal findings: Secondary | ICD-10-CM

## 2021-01-21 NOTE — Telephone Encounter (Signed)
6 month f/u 3/1, lab appt 2/22, need lab orders please.

## 2021-02-03 NOTE — Telephone Encounter (Signed)
Ok labs ordered 

## 2021-02-11 ENCOUNTER — Other Ambulatory Visit: Payer: Self-pay

## 2021-02-11 ENCOUNTER — Other Ambulatory Visit (INDEPENDENT_AMBULATORY_CARE_PROVIDER_SITE_OTHER): Payer: Medicare Other

## 2021-02-11 DIAGNOSIS — N1831 Chronic kidney disease, stage 3a: Secondary | ICD-10-CM

## 2021-02-11 DIAGNOSIS — E538 Deficiency of other specified B group vitamins: Secondary | ICD-10-CM | POA: Diagnosis not present

## 2021-02-11 DIAGNOSIS — R972 Elevated prostate specific antigen [PSA]: Secondary | ICD-10-CM | POA: Diagnosis not present

## 2021-02-11 DIAGNOSIS — D509 Iron deficiency anemia, unspecified: Secondary | ICD-10-CM | POA: Diagnosis not present

## 2021-02-11 DIAGNOSIS — R7302 Impaired glucose tolerance (oral): Secondary | ICD-10-CM

## 2021-02-11 DIAGNOSIS — E7800 Pure hypercholesterolemia, unspecified: Secondary | ICD-10-CM

## 2021-02-11 LAB — URINALYSIS, ROUTINE W REFLEX MICROSCOPIC
Bilirubin Urine: NEGATIVE
Hgb urine dipstick: NEGATIVE
Ketones, ur: NEGATIVE
Leukocytes,Ua: NEGATIVE
Nitrite: NEGATIVE
Specific Gravity, Urine: 1.02 (ref 1.000–1.030)
Total Protein, Urine: NEGATIVE
Urine Glucose: NEGATIVE
Urobilinogen, UA: 0.2 (ref 0.0–1.0)
WBC, UA: NONE SEEN (ref 0–?)
pH: 5.5 (ref 5.0–8.0)

## 2021-02-11 LAB — IBC PANEL
Iron: 72 ug/dL (ref 42–165)
Saturation Ratios: 28.6 % (ref 20.0–50.0)
Transferrin: 180 mg/dL — ABNORMAL LOW (ref 212.0–360.0)

## 2021-02-11 LAB — CBC WITH DIFFERENTIAL/PLATELET
Basophils Absolute: 0.1 10*3/uL (ref 0.0–0.1)
Basophils Relative: 1.3 % (ref 0.0–3.0)
Eosinophils Absolute: 0.1 10*3/uL (ref 0.0–0.7)
Eosinophils Relative: 2 % (ref 0.0–5.0)
HCT: 38.5 % — ABNORMAL LOW (ref 39.0–52.0)
Hemoglobin: 12.9 g/dL — ABNORMAL LOW (ref 13.0–17.0)
Lymphocytes Relative: 20 % (ref 12.0–46.0)
Lymphs Abs: 1.1 10*3/uL (ref 0.7–4.0)
MCHC: 33.5 g/dL (ref 30.0–36.0)
MCV: 93.2 fl (ref 78.0–100.0)
Monocytes Absolute: 0.5 10*3/uL (ref 0.1–1.0)
Monocytes Relative: 9.5 % (ref 3.0–12.0)
Neutro Abs: 3.8 10*3/uL (ref 1.4–7.7)
Neutrophils Relative %: 67.2 % (ref 43.0–77.0)
Platelets: 198 10*3/uL (ref 150.0–400.0)
RBC: 4.13 Mil/uL — ABNORMAL LOW (ref 4.22–5.81)
RDW: 15.1 % (ref 11.5–15.5)
WBC: 5.7 10*3/uL (ref 4.0–10.5)

## 2021-02-11 LAB — VITAMIN B12: Vitamin B-12: 399 pg/mL (ref 211–911)

## 2021-02-11 LAB — HEPATIC FUNCTION PANEL
ALT: 10 U/L (ref 0–53)
AST: 11 U/L (ref 0–37)
Albumin: 3.9 g/dL (ref 3.5–5.2)
Alkaline Phosphatase: 84 U/L (ref 39–117)
Bilirubin, Direct: 0.1 mg/dL (ref 0.0–0.3)
Total Bilirubin: 0.5 mg/dL (ref 0.2–1.2)
Total Protein: 6.8 g/dL (ref 6.0–8.3)

## 2021-02-11 LAB — LIPID PANEL
Cholesterol: 144 mg/dL (ref 0–200)
HDL: 63.8 mg/dL (ref >39.00)
LDL Cholesterol: 52 mg/dL (ref 0–99)
NonHDL: 79.99
Total CHOL/HDL Ratio: 2
Triglycerides: 140 mg/dL (ref 0.0–149.0)
VLDL: 28 mg/dL (ref 0.0–40.0)

## 2021-02-11 LAB — PHOSPHORUS: Phosphorus: 3.8 mg/dL (ref 2.3–4.6)

## 2021-02-11 LAB — BASIC METABOLIC PANEL
BUN: 27 mg/dL — ABNORMAL HIGH (ref 6–23)
CO2: 30 mEq/L (ref 19–32)
Calcium: 9.1 mg/dL (ref 8.4–10.5)
Chloride: 102 mEq/L (ref 96–112)
Creatinine, Ser: 1.47 mg/dL (ref 0.40–1.50)
GFR: 49.33 mL/min — ABNORMAL LOW (ref >60.00)
Glucose, Bld: 87 mg/dL (ref 70–99)
Potassium: 4.6 mEq/L (ref 3.5–5.1)
Sodium: 137 mEq/L (ref 135–145)

## 2021-02-11 LAB — TSH: TSH: 1.09 u[IU]/mL (ref 0.35–4.50)

## 2021-02-11 LAB — VITAMIN D 25 HYDROXY (VIT D DEFICIENCY, FRACTURES): VITD: 29.65 ng/mL — ABNORMAL LOW (ref 30.00–100.00)

## 2021-02-11 LAB — FERRITIN: Ferritin: 169.1 ng/mL (ref 22.0–322.0)

## 2021-02-11 LAB — PSA: PSA: 2.2 ng/mL (ref 0.10–4.00)

## 2021-02-11 LAB — HEMOGLOBIN A1C: Hgb A1c MFr Bld: 5.9 % (ref 4.6–6.5)

## 2021-02-15 LAB — PTH, INTACT AND CALCIUM
Calcium: 8.8 mg/dL (ref 8.6–10.3)
PTH: 94 pg/mL — ABNORMAL HIGH (ref 14–64)

## 2021-02-17 ENCOUNTER — Other Ambulatory Visit: Payer: Self-pay

## 2021-02-18 ENCOUNTER — Encounter: Payer: Self-pay | Admitting: Internal Medicine

## 2021-02-18 ENCOUNTER — Ambulatory Visit (INDEPENDENT_AMBULATORY_CARE_PROVIDER_SITE_OTHER): Payer: Medicare Other | Admitting: Internal Medicine

## 2021-02-18 ENCOUNTER — Other Ambulatory Visit: Payer: Self-pay

## 2021-02-18 VITALS — BP 140/86 | HR 62 | Ht 73.0 in | Wt 313.0 lb

## 2021-02-18 DIAGNOSIS — Z0001 Encounter for general adult medical examination with abnormal findings: Secondary | ICD-10-CM

## 2021-02-18 DIAGNOSIS — Z0000 Encounter for general adult medical examination without abnormal findings: Secondary | ICD-10-CM | POA: Diagnosis not present

## 2021-02-18 DIAGNOSIS — R350 Frequency of micturition: Secondary | ICD-10-CM | POA: Diagnosis not present

## 2021-02-18 DIAGNOSIS — I700 Atherosclerosis of aorta: Secondary | ICD-10-CM

## 2021-02-18 DIAGNOSIS — R7302 Impaired glucose tolerance (oral): Secondary | ICD-10-CM

## 2021-02-18 DIAGNOSIS — N1831 Chronic kidney disease, stage 3a: Secondary | ICD-10-CM

## 2021-02-18 DIAGNOSIS — I10 Essential (primary) hypertension: Secondary | ICD-10-CM | POA: Diagnosis not present

## 2021-02-18 DIAGNOSIS — J449 Chronic obstructive pulmonary disease, unspecified: Secondary | ICD-10-CM

## 2021-02-18 DIAGNOSIS — E559 Vitamin D deficiency, unspecified: Secondary | ICD-10-CM

## 2021-02-18 DIAGNOSIS — R079 Chest pain, unspecified: Secondary | ICD-10-CM

## 2021-02-18 MED ORDER — AMLODIPINE BESYLATE 2.5 MG PO TABS
2.5000 mg | ORAL_TABLET | Freq: Every day | ORAL | 3 refills | Status: DC
Start: 2021-02-18 — End: 2022-02-24

## 2021-02-18 MED ORDER — ATORVASTATIN CALCIUM 20 MG PO TABS
ORAL_TABLET | ORAL | 3 refills | Status: DC
Start: 2021-02-18 — End: 2021-09-12

## 2021-02-18 MED ORDER — ESOMEPRAZOLE MAGNESIUM 20 MG PO CPDR
20.0000 mg | DELAYED_RELEASE_CAPSULE | Freq: Every day | ORAL | 3 refills | Status: DC
Start: 2021-02-18 — End: 2022-02-24

## 2021-02-18 MED ORDER — TAMSULOSIN HCL 0.4 MG PO CAPS
0.4000 mg | ORAL_CAPSULE | Freq: Every day | ORAL | 3 refills | Status: DC
Start: 2021-02-18 — End: 2021-09-12

## 2021-02-18 MED ORDER — SOLIFENACIN SUCCINATE 5 MG PO TABS: 5.0000 mg | ORAL_TABLET | Freq: Every day | ORAL | 3 refills | Status: DC

## 2021-02-18 NOTE — Patient Instructions (Signed)
Your EKG was Ok today  Please take all new medication as prescribed - the vesicare 5 mg per day for the bladder  OK to double up on the Vitamin D3 that you take now  Please continue all other medications as before, and refills have been done if requested.  Please have the pharmacy call with any other refills you may need.  Please continue your efforts at being more active, low cholesterol diet, and weight control.  You are otherwise up to date with prevention measures today.  Please keep your appointments with your specialists as you may have planned  You will be contacted regarding the referral for: Cardiology  Please make an Appointment to return in 6 months, or sooner if needed, also with Lab Appointment for testing done 3-5 days before at the [facility] (so this is for TWO appointments - please see the scheduling desk as you leave)  Due to the ongoing Covid 19 pandemic, our lab now requires an appointment for any labs done at our office.  If you need labs done and do not have an appointment, please call our office ahead of time to schedule before presenting to the lab for your testing.

## 2021-02-18 NOTE — Progress Notes (Signed)
Patient ID: Henry Ellis, male   DOB: 08-29-1956, 65 y.o.   MRN: [id number]         Chief Complaint:: wellness exam and left chest pain, aortic atherosclerosis, copd, uncontroled htn, oab symptoms, low vit d, and ckd       HPI:  Henry Ellis is a 65 y.o. male here for wellness exam, plans to make eye exam soon, o/w up to date with preventive referral referrals and immunization                Also c/o left mid chest fasciculation like discomfort 2 days ago with a kind of fluttery electrical discomfort without radation, sob, diaphoresis, n/v, palp or dizziness.  Lasted off an on for several hrs without recurrence Asks for cardiology referral given his age and risk factors.  Pt denies wheezing, orthopnea, PND, increased LE swelling, palpitations, dizziness or syncope.  BP elevated today, but OK < 140/90 at home,   Also has urinary frequency numerous types daily and 3-5 times at night ongoing, Denies urinary symptoms such as dysuria, urgency, flank pain, hematuria or n/v, fever, chills. Now states overall Less stressed.  BP at home usually better then 140/90.  Out of med for 1 wk. Is taking Vit D 2000 u qd.   Wt Readings from Last 3 Encounters:  02/18/21 (!) 313 lb (142 kg)  09/23/20 297 lb (134.7 kg)  09/16/20 294 lb (133.4 kg)   BP Readings from Last 3 Encounters:  02/18/21 140/86  09/23/20 124/77  09/16/20 130/80   Immunization History  Administered Date(s) Administered  . Fluad Quad(high Dose 65+) 09/16/2020  . Influenza Split 09/27/2012  . Influenza Whole 09/07/2008, 12/04/2011  . Influenza,inj,Quad PF,6+ Mos 10/03/2013, 10/09/2014, 10/19/2016, 01/25/2018  . Influenza-Unspecified 08/30/2018  . PFIZER(Purple Top)SARS-COV-2 Vaccination 01/09/2020, 01/30/2020, 01/25/2021  . Pneumococcal Conjugate-13 08/01/2019  . Pneumococcal Polysaccharide-23 03/05/2009, 10/19/2016  . Td 03/05/2009  . Tdap 08/01/2019  . Zoster 04/16/2015   There are no preventive care reminders to display for  this patient.    Past Medical History:  Diagnosis Date  . Allergy    seasonal allergies  . Anemia    on meds  . Anxiety    hx of  . Arthritis    generalized  . Barrett's esophagus   . Cancer ([place]) 10/17/2016   kidney cancer(removed left kidney in 2017)  . Carpal tunnel syndrome, right 04/21/2011  . Chronic bronchitis   . Chronic kidney disease 2017   2017-had left kidney removed  . ED (erectile dysfunction)   . Esophageal reflux    on meds  . Esophageal stricture   . Essential hypertension 10/19/2015   on meds  . H/O adenomatous polyp of colon 06/05/2012  . Hiatal hernia   . High cholesterol    on meds  . History of Bell's palsy   . IBS (irritable bowel syndrome)   . Personal history of colonic polyps 04/15/2008 & 04/22/2012   TUBULAR ADENOMA  . Ulcerative esophagitis    Past Surgical History:  Procedure Laterality Date  . CARPAL TUNNEL RELEASE     left  . INGUINAL HERNIA REPAIR     bilateral  . KNEE ARTHROSCOPY     right [name]  . ROBOT ASSISTED LAPAROSCOPIC NEPHRECTOMY Left 10/18/2016   Procedure: XI ROBOTIC ASSISTED LAPAROSCOPIC RADICAL NEPHRECTOMY;  Surgeon: Alexis Frock, MD;  Location: [facility];  Service: Urology;  Laterality: Left;  . WISDOM TOOTH EXTRACTION      reports that he quit  smoking about 4 years ago. His smoking use included cigarettes. He smoked 1.00 pack per day. He has never used smokeless tobacco. He reports current alcohol use of about 3.0 standard drinks of alcohol per week. He reports previous drug use. Frequency: 2.00 times per week. Drug: Marijuana. family history includes Heart disease in his father; Hypertension in his brother; Lung cancer in his father. Allergies  Allergen Reactions  . Bee Venom Anaphylaxis  . Aspirin Nausea Only  . Amoxicillin Rash    Did PCN reaction causing immediate rash, facial/tongue/throat swelling, SOB or lightheadedness with hypotension: no Did PCN reaction causing severe rash involving mucus membranes or skin  necrosis: no Did PCN reaction that required hospitalization : no Did pcn reaction occurring within the last 10 years: no If all of the above answers are "NO", then may proceed with Cephalosporin use. Pt states he can take low doses of amoxicillin, has taken for dental procedures without reaction  . Other Rash    Aspirin cream    Current Outpatient Medications on File Prior to Visit  Medication Sig Dispense Refill  . Cholecalciferol (VITAMIN D3 PO) Take by mouth.    . docusate sodium (COLACE) 100 MG capsule Take 100 mg by mouth daily.    . ferrous sulfate 325 (65 FE) MG tablet Take 325 mg by mouth daily with breakfast.    . finasteride (PROSCAR) 5 MG tablet Take 5 mg by mouth daily.    . Psyllium 30.9 % POWD Take 1 packet by mouth daily. (Patient not taking: Reported on 02/18/2021)     No current facility-administered medications on file prior to visit.        ROS:  All others reviewed and negative.  Objective        PE:  BP 140/86   Pulse 62   Ht 6\' 1"  (1.854 m)   Wt (!) 313 lb (142 kg)   SpO2 99%   BMI 41.30 kg/m                 Constitutional: Pt appears in NAD               HENT: Head: NCAT.                Right Ear: External ear normal.                 Left Ear: External ear normal.                Eyes: . Pupils are equal, round, and reactive to light. Conjunctivae and EOM are normal               Nose: without d/c or deformity               Neck: Neck supple. Gross normal ROM               Cardiovascular: Normal rate and regular rhythm.                 Pulmonary/Chest: Effort normal and breath sounds without rales or wheezing.                Abd:  Soft, NT, ND, + BS, no organomegaly               Neurological: Pt is alert. At baseline orientation, motor grossly intact               Skin: Skin is warm. No rashes, no other new lesions,  LE edema - none               Psychiatric: Pt behavior is normal without agitation   Micro: none  Cardiac tracings I have personally  interpreted today:  ecg - sinus bradycardia with first degree AVB - 52  Pertinent Radiological findings (summarize): none   Lab Results  Component Value Date   WBC 5.7 02/11/2021   HGB 12.9 (L) 02/11/2021   HCT 38.5 (L) 02/11/2021   PLT 198.0 02/11/2021   GLUCOSE 87 02/11/2021   CHOL 144 02/11/2021   TRIG 140.0 02/11/2021   HDL 63.80 02/11/2021   LDLDIRECT 151.7 03/25/2010   LDLCALC 52 02/11/2021   ALT 10 02/11/2021   AST 11 02/11/2021   NA 137 02/11/2021   K 4.6 02/11/2021   CL 102 02/11/2021   CREATININE 1.47 02/11/2021   BUN 27 (H) 02/11/2021   CO2 30 02/11/2021   TSH 1.09 02/11/2021   PSA 2.20 02/11/2021   HGBA1C 5.9 02/11/2021   MICROALBUR 2.4 (H) 02/13/2020   Assessment/Plan:  Henry Ellis is a 65 y.o. White or Caucasian [1] male with  has a past medical history of Allergy, Anemia, Anxiety, Arthritis, Barrett's esophagus, Cancer ([place]) (10/17/2016), Carpal tunnel syndrome, right (04/21/2011), Chronic bronchitis, Chronic kidney disease (2017), ED (erectile dysfunction), Esophageal reflux, Esophageal stricture, Essential hypertension (10/19/2015), H/O adenomatous polyp of colon (06/05/2012), Hiatal hernia, High cholesterol, History of Bell's palsy, IBS (irritable bowel syndrome), Personal history of colonic polyps (04/15/2008 & 04/22/2012), and Ulcerative esophagitis.  Encounter for well adult exam with abnormal findings Age and sex appropriate education and counseling updated with regular exercise and diet Referrals for preventative services -  Pt plans to make eye exam soon Immunizations addressed - none needed Smoking counseling  - none needed Evidence for depression or other mood disorder - none significant Most recent labs reviewed. I have personally reviewed and have noted: 1) the patient's medical and social history 2) The patient's current medications and supplements 3) The patient's height, weight, and BMI have been recorded in the chart   CKD (chronic kidney  disease) Lab Results  Component Value Date   CREATININE 1.47 02/11/2021   Stable overall, cont to avoid nephrotoxins, cont to f/u urology as planned   Essential hypertension BP Readings from Last 3 Encounters:  02/18/21 140/86  09/23/20 124/77  09/16/20 130/80   Stable, pt to continue medical treatment with restart norvasc   Current Outpatient Medications (Cardiovascular):  .  amLODipine (NORVASC) 2.5 MG tablet, Take 1 tablet (2.5 mg total) by mouth daily. Marland Kitchen  atorvastatin (LIPITOR) 20 MG tablet, TAKE 1 TABLET(20 MG) BY MOUTH DAILY AT 6 PM    Current Outpatient Medications (Hematological):  .  ferrous sulfate 325 (65 FE) MG tablet, Take 325 mg by mouth daily with breakfast.  Current Outpatient Medications (Other):  Marland Kitchen  Cholecalciferol (VITAMIN D3 PO), Take by mouth. .  docusate sodium (COLACE) 100 MG capsule, Take 100 mg by mouth daily. .  finasteride (PROSCAR) 5 MG tablet, Take 5 mg by mouth daily. .  solifenacin (VESICARE) 5 MG tablet, Take 1 tablet (5 mg total) by mouth daily. Marland Kitchen  esomeprazole (NEXIUM) 20 MG capsule, Take 1 capsule (20 mg total) by mouth daily at 12 noon. .  Psyllium 30.9 % POWD, Take 1 packet by mouth daily. (Patient not taking: Reported on 02/18/2021) .  tamsulosin (FLOMAX) 0.4 MG CAPS capsule, Take 1 capsule (0.4 mg total) by mouth daily.   Impaired glucose tolerance Lab Results  Component Value Date   HGBA1C 5.9 02/11/2021   Stable, pt to continue current medical treatment  - diet and wt control   Vitamin D deficiency Last vitamin D Lab Results  Component Value Date   VD25OH 29.65 (L) 02/11/2021   Low, to increased oral replacement to 4000 u qd  Chest pain Atypical, etiology unclear, ecg reveiwed, for cardiology referral given crf's and FH  Aortic atherosclerosis ([place]) To continue lipitor 20 and low chol diet   COPD (chronic obstructive pulmonary disease) (HCC) Stable, declines further med tx  Increased frequency of urination Likely  c/w oab - for vesicaren 5 qd, and urology if not improving  Followup: Return in about 6 months (around 08/21/2021).  Cathlean Cower, MD 02/20/2021 8:18 PM [facility]

## 2021-02-20 ENCOUNTER — Encounter: Payer: Self-pay | Admitting: Internal Medicine

## 2021-02-20 DIAGNOSIS — J449 Chronic obstructive pulmonary disease, unspecified: Secondary | ICD-10-CM | POA: Insufficient documentation

## 2021-02-20 DIAGNOSIS — I700 Atherosclerosis of aorta: Secondary | ICD-10-CM | POA: Insufficient documentation

## 2021-02-20 NOTE — Assessment & Plan Note (Signed)
To continue lipitor 20 and low chol diet

## 2021-02-20 NOTE — Assessment & Plan Note (Signed)
Age and sex appropriate education and counseling updated with regular exercise and diet Referrals for preventative services -  Pt plans to make eye exam soon Immunizations addressed - none needed Smoking counseling  - none needed Evidence for depression or other mood disorder - none significant Most recent labs reviewed. I have personally reviewed and have noted: 1) the patient's medical and social history 2) The patient's current medications and supplements 3) The patient's height, weight, and BMI have been recorded in the chart

## 2021-02-20 NOTE — Assessment & Plan Note (Addendum)
Lab Results  Component Value Date   CREATININE 1.47 02/11/2021   Stable overall, cont to avoid nephrotoxins, cont to f/u urology as planned

## 2021-02-20 NOTE — Assessment & Plan Note (Signed)
Last vitamin D Lab Results  Component Value Date   VD25OH 29.65 (L) 02/11/2021   Low, to increased oral replacement to 4000 u qd

## 2021-02-20 NOTE — Assessment & Plan Note (Signed)
BP Readings from Last 3 Encounters:  02/18/21 140/86  09/23/20 124/77  09/16/20 130/80   Stable, pt to continue medical treatment with restart norvasc   Current Outpatient Medications (Cardiovascular):  .  amLODipine (NORVASC) 2.5 MG tablet, Take 1 tablet (2.5 mg total) by mouth daily. Marland Kitchen  atorvastatin (LIPITOR) 20 MG tablet, TAKE 1 TABLET(20 MG) BY MOUTH DAILY AT 6 PM    Current Outpatient Medications (Hematological):  .  ferrous sulfate 325 (65 FE) MG tablet, Take 325 mg by mouth daily with breakfast.  Current Outpatient Medications (Other):  Marland Kitchen  Cholecalciferol (VITAMIN D3 PO), Take by mouth. .  docusate sodium (COLACE) 100 MG capsule, Take 100 mg by mouth daily. .  finasteride (PROSCAR) 5 MG tablet, Take 5 mg by mouth daily. .  solifenacin (VESICARE) 5 MG tablet, Take 1 tablet (5 mg total) by mouth daily. Marland Kitchen  esomeprazole (NEXIUM) 20 MG capsule, Take 1 capsule (20 mg total) by mouth daily at 12 noon. .  Psyllium 30.9 % POWD, Take 1 packet by mouth daily. (Patient not taking: Reported on 02/18/2021) .  tamsulosin (FLOMAX) 0.4 MG CAPS capsule, Take 1 capsule (0.4 mg total) by mouth daily.

## 2021-02-20 NOTE — Assessment & Plan Note (Signed)
Stable, declines further med tx

## 2021-02-20 NOTE — Assessment & Plan Note (Signed)
Lab Results  Component Value Date   HGBA1C 5.9 02/11/2021   Stable, pt to continue current medical treatment  - diet and wt control

## 2021-02-20 NOTE — Assessment & Plan Note (Signed)
Likely c/w oab - for vesicaren 5 qd, and urology if not improving

## 2021-02-20 NOTE — Assessment & Plan Note (Signed)
Atypical, etiology unclear, ecg reveiwed, for cardiology referral given crf's and FH

## 2021-03-11 ENCOUNTER — Ambulatory Visit: Payer: Medicare Other | Admitting: Internal Medicine

## 2021-03-11 ENCOUNTER — Encounter: Payer: Self-pay | Admitting: Internal Medicine

## 2021-03-11 ENCOUNTER — Other Ambulatory Visit: Payer: Self-pay

## 2021-03-11 VITALS — BP 144/80 | HR 61 | Ht 73.0 in | Wt 310.0 lb

## 2021-03-11 DIAGNOSIS — R0609 Other forms of dyspnea: Secondary | ICD-10-CM

## 2021-03-11 DIAGNOSIS — I10 Essential (primary) hypertension: Secondary | ICD-10-CM

## 2021-03-11 DIAGNOSIS — I700 Atherosclerosis of aorta: Secondary | ICD-10-CM | POA: Diagnosis not present

## 2021-03-11 DIAGNOSIS — N1831 Chronic kidney disease, stage 3a: Secondary | ICD-10-CM | POA: Diagnosis not present

## 2021-03-11 DIAGNOSIS — R0600 Dyspnea, unspecified: Secondary | ICD-10-CM | POA: Diagnosis not present

## 2021-03-11 DIAGNOSIS — M7989 Other specified soft tissue disorders: Secondary | ICD-10-CM

## 2021-03-11 NOTE — Patient Instructions (Signed)
Medication Instructions:  Your physician recommends that you continue on your current medications as directed. Please refer to the Current Medication list given to you today.  *If you need a refill on your cardiac medications before your next appointment, please call your pharmacy*   Lab Work: NONE If you have labs (blood work) drawn today and your tests are completely normal, you will receive your results only by: [facility] MyChart Message (if you have MyChart) OR . A paper copy in the mail If you have any lab test that is abnormal or we need to change your treatment, we will call you to review the results.   Testing/Procedures: Your physician has requested that you have an echocardiogram. Echocardiography is a painless test that uses sound waves to create images of your heart. It provides your doctor with information about the size and shape of your heart and how well your heart's chambers and valves are working. This procedure takes approximately one hour. There are no restrictions for this procedure.     Follow-Up: At [facility], you and your health needs are our priority.  As part of our continuing mission to provide you with exceptional heart care, we have created designated Provider Care Teams.  These Care Teams include your primary Cardiologist (physician) and Advanced Practice Providers (APPs -  Physician Assistants and Nurse Practitioners) who all work together to provide you with the care you need, when you need it.  We recommend signing up for the patient portal called "MyChart".  Sign up information is provided on this After Visit Summary.  MyChart is used to connect with patients for Virtual Visits (Telemedicine).  Patients are able to view lab/test results, encounter notes, upcoming appointments, etc.  Non-urgent messages can be sent to your provider as well.   To learn more about what you can do with MyChart, go to [email or web address].    Your next appointment:   6  month(s)  The format for your next appointment:   In Person  Provider:   You may see Werner Lean, MD or one of the following Advanced Practice Providers on your designated Care Team:    Melina Copa, PA-C  Ermalinda Barrios, PA-C

## 2021-03-11 NOTE — Progress Notes (Signed)
Cardiology Office Note:    Date:  03/11/2021   ID:  Starr, Engel 05-Apr-1956, MRN [id number]  PCP:  Biagio Borg, MD   [place]  Cardiologist:  Werner Lean, MD  Advanced Practice Provider:  No care team member to display Electrophysiologist:  None       CC: Get checked out. Consulted for the evaluation of chest pain at the behest of Biagio Borg, MD  History of Present Illness:    Henry Ellis is a 65 y.o. male with a hx of HTN, Aortic Atherosclerosis HLD, Distant smoking in 2017; morbid obesiity who presents fore evaluation 03/11/21.  Patient notes that he is feeling great.  Has had no chest pain, chest pressure, chest tightness, chest stinging .  Patient exertion notable for walking  and feels no symptoms.  No shortness of breath, DOE.  No PND or orthopnea.  Notes bendonpnea. does note weight gain, leg swelling and no abdominal swelling.  No syncope or near syncope . Notes  no palpitations or funny heart beats.     Ambulatory BP 130/80.  ASA SE- nausea.  Past Medical History:  Diagnosis Date   Allergy    seasonal allergies   Anemia    on meds   Anxiety    hx of   Arthritis    generalized   Barrett's esophagus    Cancer ([place]) 10/17/2016   kidney cancer(removed left kidney in 2017)   Carpal tunnel syndrome, right 04/21/2011   Chronic bronchitis    Chronic kidney disease 2017   2017-had left kidney removed   ED (erectile dysfunction)    Esophageal reflux    on meds   Esophageal stricture    Essential hypertension 10/19/2015   on meds   H/O adenomatous polyp of colon 06/05/2012   Hiatal hernia    High cholesterol    on meds   History of Bell's palsy    IBS (irritable bowel syndrome)    Personal history of colonic polyps 04/15/2008 & 04/22/2012   TUBULAR ADENOMA   Ulcerative esophagitis     Past Surgical History:  Procedure Laterality Date   CARPAL TUNNEL RELEASE     left   INGUINAL  HERNIA REPAIR     bilateral   KNEE ARTHROSCOPY     right [name]   ROBOT ASSISTED LAPAROSCOPIC NEPHRECTOMY Left 10/18/2016   Procedure: XI ROBOTIC ASSISTED LAPAROSCOPIC RADICAL NEPHRECTOMY;  Surgeon: Alexis Frock, MD;  Location: [facility];  Service: Urology;  Laterality: Left;   WISDOM TOOTH EXTRACTION      Current Medications: Current Meds  Medication Sig   amLODipine (NORVASC) 2.5 MG tablet Take 1 tablet (2.5 mg total) by mouth daily.   atorvastatin (LIPITOR) 20 MG tablet TAKE 1 TABLET(20 MG) BY MOUTH DAILY AT 6 PM   Cholecalciferol (VITAMIN D3 PO) Take by mouth.   docusate sodium (COLACE) 100 MG capsule Take 100 mg by mouth daily.   esomeprazole (NEXIUM) 20 MG capsule Take 1 capsule (20 mg total) by mouth daily at 12 noon.   ferrous sulfate 325 (65 FE) MG tablet Take 325 mg by mouth daily with breakfast.   finasteride (PROSCAR) 5 MG tablet Take 5 mg by mouth daily.   Psyllium 30.9 % POWD Take 1 packet by mouth daily.   solifenacin (VESICARE) 5 MG tablet Take 1 tablet (5 mg total) by mouth daily.   tamsulosin (FLOMAX) 0.4 MG CAPS capsule Take 1 capsule (0.4 mg total) by  mouth daily.     Allergies:   Bee venom, Aspirin, Amoxicillin, and Other   Social History   Socioeconomic History   Marital status: Single    Spouse name: Not on file   Number of children: Not on file   Years of education: Not on file   Highest education level: Not on file  Occupational History   Occupation: Dealer    Employer: [employer]  Tobacco Use   Smoking status: Former Smoker    Packs/day: 1.00    Types: Cigarettes    Quit date: 10/17/2016    Years since quitting: 4.4   Smokeless tobacco: Never Used   Tobacco comment: Counseling sheet given 03-2012   Vaping Use   Vaping Use: Former  Substance and Sexual Activity   Alcohol use: Yes    Alcohol/week: 3.0 standard drinks    Types: 3 Shots of liquor per week   Drug use: Not Currently    Frequency: 2.0 times  per week    Types: Marijuana    Comment: last use June 05, 2016   Sexual activity: Not on file  Other Topics Concern   Not on file  Social History Narrative   Not on file   Social Determinants of Health   Financial Resource Strain: Not on file  Food Insecurity: Not on file  Transportation Needs: Not on file  Physical Activity: Not on file  Stress: Not on file  Social Connections: Not on file     Family History: The patient's family history includes Heart disease in his father; Hypertension in his brother; Lung cancer in his father. There is no history of Colon cancer, Stomach cancer, Esophageal cancer, Rectal cancer, or Colon polyps.  History of coronary artery disease notable for father and oldest brother. History of heart failure notable for no members. History of arrhythmia notable for sister has pacemaker.  ROS:   Please see the history of present illness.     All other systems reviewed and are negative.  EKGs/Labs/Other Studies Reviewed:    The following studies were reviewed today:  EKG:   02/18/21: SR 1st HB rate 82   NonCardiac CT : Date: 10/01/20 Results: Aortic Atherosclerosis without CAC  NM Stress Testing: Date: 04/05/2012 Results:  Negative per report  Recent Labs: 08/20/2020: Brain Natriuretic Peptide 96 02/11/2021: ALT 10; BUN 27; Creatinine, Ser 1.47; Hemoglobin 12.9; Platelets 198.0; Potassium 4.6; Sodium 137; TSH 1.09  Recent Lipid Panel    Component Value Date/Time   CHOL 144 02/11/2021 0847   TRIG 140.0 02/11/2021 0847   HDL 63.80 02/11/2021 0847   CHOLHDL 2 02/11/2021 0847   VLDL 28.0 02/11/2021 0847   LDLCALC 52 02/11/2021 0847   LDLDIRECT 151.7 03/25/2010 1108     Risk Assessment/Calculations:       Physical Exam:    VS:  BP (!) 144/80    Pulse 61    Ht 6\' 1"  (1.854 m)    Wt (!) 310 lb (140.6 kg)    SpO2 99%    BMI 40.90 kg/m     Wt Readings from Last 3 Encounters:  03/11/21 (!) 310 lb (140.6 kg)  02/18/21 (!) 313 lb (142 kg)   09/23/20 297 lb (134.7 kg)     GEN: Obese well developed in no acute distress HEENT: Normal NECK: No JVD (difficult exam); No carotid bruits LYMPHATICS: No lymphadenopathy CARDIAC: RRR, no murmurs, rubs, gallops RESPIRATORY:  Clear to auscultation without rales, wheezing or rhonchi  ABDOMEN: Soft, non-tender, non-distended  MUSCULOSKELETAL:  +2 pitting edema; No deformity  SKIN: Warm and dry NEUROLOGIC:  Alert and oriented x 3 PSYCHIATRIC:  Normal affect   ASSESSMENT:    1. DOE (dyspnea on exertion)   2. Stage 3a chronic kidney disease ([place])   3. Aortic atherosclerosis ([place])   4. Essential hypertension   5. Leg swelling    PLAN:    In order of problems listed above:  CKD IIIa DOE LE Swelling Morbid Obesity HTN - offered trial of diuretic to patient:  He notes that with his overactive bladder issues he had a bad experience with this where he was urinating all the time.  Does not want a diuretic at this time; was agreeable to getting an echo and re-discussing if there is evidence of ventricular dysfunction  Aortic atherosclerosis - LDL at goal <70 with current statin - review last CT with patient   Summer/Fall follow up unless new symptoms or abnormal test results warranting change in plan Would be reasonable for APP Follow up    Medication Adjustments/Labs and Tests Ordered: Current medicines are reviewed at length with the patient today.  Concerns regarding medicines are outlined above.  Orders Placed This Encounter  Procedures   ECHOCARDIOGRAM COMPLETE   No orders of the defined types were placed in this encounter.   Patient Instructions  Medication Instructions:  Your physician recommends that you continue on your current medications as directed. Please refer to the Current Medication list given to you today.  *If you need a refill on your cardiac medications before your next appointment, please call your pharmacy*   Lab Work: NONE If you have labs  (blood work) drawn today and your tests are completely normal, you will receive your results only by:  [place] (if you have MyChart) OR  A paper copy in the mail If you have any lab test that is abnormal or we need to change your treatment, we will call you to review the results.   Testing/Procedures: Your physician has requested that you have an echocardiogram. Echocardiography is a painless test that uses sound waves to create images of your heart. It provides your doctor with information about the size and shape of your heart and how well your hearts chambers and valves are working. This procedure takes approximately one hour. There are no restrictions for this procedure.     Follow-Up: At [facility], you and your health needs are our priority.  As part of our continuing mission to provide you with exceptional heart care, we have created designated Provider Care Teams.  These Care Teams include your primary Cardiologist (physician) and Advanced Practice Providers (APPs -  Physician Assistants and Nurse Practitioners) who all work together to provide you with the care you need, when you need it.  We recommend signing up for the patient portal called "MyChart".  Sign up information is provided on this After Visit Summary.  MyChart is used to connect with patients for Virtual Visits (Telemedicine).  Patients are able to view lab/test results, encounter notes, upcoming appointments, etc.  Non-urgent messages can be sent to your provider as well.   To learn more about what you can do with MyChart, go to [email or web address].    Your next appointment:   6 month(s)  The format for your next appointment:   In Person  Provider:   You may see Werner Lean, MD or one of the following Advanced Practice Providers on your designated Care Team:    Melina Copa,  PA-C  Ermalinda Barrios, PA-C          Signed, Werner Lean, MD  03/11/2021 12:50 PM    [facility]

## 2021-03-31 DIAGNOSIS — H524 Presbyopia: Secondary | ICD-10-CM | POA: Diagnosis not present

## 2021-03-31 LAB — HM DIABETES EYE EXAM

## 2021-04-12 ENCOUNTER — Ambulatory Visit (HOSPITAL_COMMUNITY): Payer: Medicare Other | Attending: Cardiology

## 2021-04-12 ENCOUNTER — Other Ambulatory Visit: Payer: Self-pay

## 2021-04-12 DIAGNOSIS — R0600 Dyspnea, unspecified: Secondary | ICD-10-CM | POA: Diagnosis not present

## 2021-04-12 DIAGNOSIS — R0609 Other forms of dyspnea: Secondary | ICD-10-CM

## 2021-04-12 LAB — ECHOCARDIOGRAM COMPLETE
Area-P 1/2: 3.03 cm2
S' Lateral: 3.5 cm

## 2021-04-12 MED ORDER — PERFLUTREN LIPID MICROSPHERE
1.0000 mL | INTRAVENOUS | Status: AC | PRN
  Administered 2021-04-12: 3 mL via INTRAVENOUS

## 2021-04-19 ENCOUNTER — Telehealth: Payer: Self-pay

## 2021-04-19 NOTE — Telephone Encounter (Signed)
Wellness visit scheduled for July 13

## 2021-05-03 ENCOUNTER — Encounter: Payer: Self-pay | Admitting: Internal Medicine

## 2021-06-29 ENCOUNTER — Ambulatory Visit (INDEPENDENT_AMBULATORY_CARE_PROVIDER_SITE_OTHER): Payer: Medicare Other

## 2021-06-29 ENCOUNTER — Other Ambulatory Visit: Payer: Self-pay

## 2021-06-29 VITALS — BP 120/60 | HR 72 | Temp 98.3°F | Ht 73.0 in | Wt 322.4 lb

## 2021-06-29 DIAGNOSIS — Z0000 Encounter for general adult medical examination without abnormal findings: Secondary | ICD-10-CM

## 2021-06-29 NOTE — Progress Notes (Signed)
Subjective:   Henry Ellis is a 65 y.o. male who presents for Medicare Annual/Subsequent preventive examination.  Review of Systems     Cardiac Risk Factors include: advanced age (>66men, >63 women);dyslipidemia;hypertension;male gender;obesity (BMI >30kg/m2);family history of premature cardiovascular disease     Objective:    Today's Vitals   06/29/21 1122 06/29/21 1137  BP:  120/60  Pulse:  72  Temp:  98.3 F (36.8 C)  SpO2:  98%  Weight:  (!) 322 lb 6.4 oz (146.2 kg)  Height:  6\' 1"  (1.854 m)  PainSc: 0-No pain 0-No pain   Body mass index is 42.54 kg/m.  Advanced Directives 06/29/2021 07/09/2017 06/25/2017 10/18/2016 10/16/2016  Does Patient Have a Medical Advance Directive? Yes Yes Yes Yes Yes  Type of Advance Directive - - Living will;Healthcare Power of [name];Living will [name];Living will  Does patient want to make changes to medical advance directive? No - Patient declined - - - -  Copy of [place] in Chart? - - - Yes (No Data)    Current Medications (verified) Outpatient Encounter Medications as of 06/29/2021  Medication Sig   amLODipine (NORVASC) 2.5 MG tablet Take 1 tablet (2.5 mg total) by mouth daily.   atorvastatin (LIPITOR) 20 MG tablet TAKE 1 TABLET(20 MG) BY MOUTH DAILY AT 6 PM   Cholecalciferol (VITAMIN D3 PO) Take by mouth.   docusate sodium (COLACE) 100 MG capsule Take 100 mg by mouth daily.   esomeprazole (NEXIUM) 20 MG capsule Take 1 capsule (20 mg total) by mouth daily at 12 noon.   ferrous sulfate 325 (65 FE) MG tablet Take 325 mg by mouth daily with breakfast.   finasteride (PROSCAR) 5 MG tablet Take 5 mg by mouth daily.   Psyllium 30.9 % POWD Take 1 packet by mouth daily.   solifenacin (VESICARE) 5 MG tablet Take 1 tablet (5 mg total) by mouth daily.   tamsulosin (FLOMAX) 0.4 MG CAPS capsule Take 1 capsule (0.4 mg total) by mouth daily. (Patient not taking: Reported on  06/29/2021)   No facility-administered encounter medications on file as of 06/29/2021.    Allergies (verified) Bee venom, Aspirin, Amoxicillin, and Other   History: Past Medical History:  Diagnosis Date   Allergy    seasonal allergies   Anemia    on meds   Anxiety    hx of   Arthritis    generalized   Barrett's esophagus    Cancer ([place]) 10/17/2016   kidney cancer(removed left kidney in 2017)   Carpal tunnel syndrome, right 04/21/2011   Chronic bronchitis    Chronic kidney disease 2017   2017-had left kidney removed   ED (erectile dysfunction)    Esophageal reflux    on meds   Esophageal stricture    Essential hypertension 10/19/2015   on meds   H/O adenomatous polyp of colon 06/05/2012   Hiatal hernia    High cholesterol    on meds   History of Bell's palsy    IBS (irritable bowel syndrome)    Personal history of colonic polyps 04/15/2008 & 04/22/2012   TUBULAR ADENOMA   Ulcerative esophagitis    Past Surgical History:  Procedure Laterality Date   CARPAL TUNNEL RELEASE     left   INGUINAL HERNIA REPAIR     bilateral   KNEE ARTHROSCOPY     right Dr Henry Ellis   ROBOT ASSISTED LAPAROSCOPIC NEPHRECTOMY Left 10/18/2016   Procedure: XI ROBOTIC  ASSISTED LAPAROSCOPIC RADICAL NEPHRECTOMY;  Surgeon: Henry Frock, MD;  Location: [facility];  Service: Urology;  Laterality: Left;   WISDOM TOOTH EXTRACTION     Family History  Problem Relation Age of Onset   Heart disease Father    Lung cancer Father    Hypertension Brother        THROUGHOUT FAMILY   Colon cancer Neg Hx    Stomach cancer Neg Hx    Esophageal cancer Neg Hx    Rectal cancer Neg Hx    Colon polyps Neg Hx    Social History   Socioeconomic History   Marital status: Single    Spouse name: Not on file   Number of children: Not on file   Years of education: Not on file   Highest education level: Not on file  Occupational History   Occupation: Dealer    Employer: [employer]  Tobacco Use   Smoking  status: Former    Packs/day: 1.00    Pack years: 0.00    Types: Cigarettes    Quit date: 10/17/2016    Years since quitting: 4.7   Smokeless tobacco: Never   Tobacco comments:    Counseling sheet given 03-2012   Vaping Use   Vaping Use: Former  Substance and Sexual Activity   Alcohol use: Yes    Alcohol/week: 3.0 standard drinks    Types: 3 Shots of liquor per week   Drug use: Not Currently    Frequency: 2.0 times per week    Types: Marijuana    Comment: last use June 05, 2016   Sexual activity: Not on file  Other Topics Concern   Not on file  Social History Narrative   Not on file   Social Determinants of Health   Financial Resource Strain: Low Risk    Difficulty of Paying Living Expenses: Not hard at all  Food Insecurity: No Food Insecurity   Worried About Charity fundraiser in the Last Year: Never true   [place] in the Last Year: Never true  Transportation Needs: No Transportation Needs   Lack of Transportation (Medical): No   Lack of Transportation (Non-Medical): No  Physical Activity: Sufficiently Active   Days of Exercise per Week: 5 days   Minutes of Exercise per Session: 30 min  Stress: No Stress Concern Present   Feeling of Stress : Not at all  Social Connections: Socially Isolated   Frequency of Communication with Friends and Family: Three times a week   Frequency of Social Gatherings with Friends and Family: Once a week   Attends Religious Services: Never   Marine scientist or Organizations: No   Attends Music therapist: Never   Marital Status: Never married    Tobacco Counseling Counseling given: Not Answered Tobacco comments: Counseling sheet given 03-2012    Clinical Intake:  Pre-visit preparation completed: Yes  Pain : No/denies pain Pain Score: 0-No pain     BMI - recorded: 42.54 Nutritional Status: BMI > 30  Obese Nutritional Risks: None Diabetes: No  How often do you need to have someone help you when you  read instructions, pamphlets, or other written materials from your doctor or pharmacy?: 1 - Never What is the last grade level you completed in school?: High School Graduate  Diabetic? no  Interpreter Needed?: No  Information entered by :: Lisette Abu, LPN   Activities of Daily Living In your present state of health, do you have any  difficulty performing the following activities: 06/29/2021 02/18/2021  Hearing? Y N  Vision? N N  Difficulty concentrating or making decisions? N N  Walking or climbing stairs? N Y  Dressing or bathing? N N  Doing errands, shopping? N N  Preparing Food and eating ? N -  Using the Toilet? N -  In the past six months, have you accidently leaked urine? N -  Do you have problems with loss of bowel control? N -  Managing your Medications? N -  Managing your Finances? N -  Housekeeping or managing your Housekeeping? N -  Some recent data might be hidden    Patient Care Team: Biagio Borg, MD as PCP - General Werner Lean, MD as PCP - Cardiology (Cardiology)  Indicate any recent Medical Services you may have received from other than [facility] providers in the past year (date may be approximate).     Assessment:   This is a routine wellness examination for [place].  Hearing/Vision screen No results found.  Dietary issues and exercise activities discussed: Current Exercise Habits: Home exercise routine, Type of exercise: walking, Time (Minutes): 30, Frequency (Times/Week): 5, Weekly Exercise (Minutes/Week): 150, Intensity: Moderate, Exercise limited by: respiratory conditions(s);orthopedic condition(s)   Goals Addressed               This Visit's Progress     Patient Stated (pt-stated)        My goal is to lose weight.       Depression Screen PHQ 2/9 Scores 06/29/2021 02/13/2020 01/31/2019 01/25/2018 01/18/2017 04/21/2016 04/16/2015  PHQ - 2 Score 0 0 0 0 0 0 0    Fall Risk Fall Risk  06/29/2021 08/20/2020 02/13/2020 01/31/2019 01/18/2017   Falls in the past year? 0 0 0 0 No  Number falls in past yr: 0 0 - - -  Injury with Fall? 0 0 - - -  Risk for fall due to : No Fall Risks No Fall Risks - - -  Follow up Falls evaluation completed Falls evaluation completed - - -    FALL RISK PREVENTION PERTAINING TO THE HOME:  Any stairs in or around the home? No  If so, are there any without handrails? No  Home free of loose throw rugs in walkways, pet beds, electrical cords, etc? Yes  Adequate lighting in your home to reduce risk of falls? Yes   ASSISTIVE DEVICES UTILIZED TO PREVENT FALLS:  Life alert? No  Use of a cane, walker or w/c? No  Grab bars in the bathroom? No  Shower chair or bench in shower? No  Elevated toilet seat or a handicapped toilet? No   TIMED UP AND GO:  Was the test performed? Yes .  Length of time to ambulate 10 feet: 7 sec.   Gait steady and fast without use of assistive device  Cognitive Function:Normal cognitive status assessed by direct observation by this Nurse Health Advisor. No abnormalities found.          Immunizations Immunization History  Administered Date(s) Administered   Fluad Quad(high Dose 65+) 09/16/2020   Influenza Split 09/27/2012   Influenza Whole 09/07/2008, 12/04/2011   Influenza,inj,Quad PF,6+ Mos 10/03/2013, 10/09/2014, 10/19/2016, 01/25/2018   Influenza-Unspecified 08/30/2018   PFIZER(Purple Top)SARS-COV-2 Vaccination 01/09/2020, 01/30/2020, 01/25/2021   Pneumococcal Conjugate-13 08/01/2019   Pneumococcal Polysaccharide-23 03/05/2009, 10/19/2016   Td 03/05/2009   Tdap 08/01/2019   Zoster, Live 04/16/2015    TDAP status: Up to date  Flu Vaccine status: Up to date  Pneumococcal vaccine status: Up to date  Covid-19 vaccine status: Completed vaccines  Qualifies for Shingles Vaccine? Yes   Zostavax completed Yes   Shingrix Completed?: No.    Education has been provided regarding the importance of this vaccine. Patient has been advised to call insurance company  to determine out of pocket expense if they have not yet received this vaccine. Advised may also receive vaccine at local pharmacy or Health Dept. Verbalized acceptance and understanding.  Screening Tests Health Maintenance  Topic Date Due   Zoster Vaccines- Shingrix (1 of 2) Never done   URINE MICROALBUMIN  02/12/2021   COVID-19 Vaccine (4 - Booster for Pfizer series) 04/24/2021   INFLUENZA VACCINE  07/18/2021   HEMOGLOBIN A1C  08/11/2021   PNA vac Low Risk Adult (2 of 2 - PPSV23) 10/19/2021   FOOT EXAM  02/18/2022   OPHTHALMOLOGY EXAM  03/31/2022   COLONOSCOPY (Pts 45-44yrs Insurance coverage will need to be confirmed)  09/24/2023   TETANUS/TDAP  07/31/2029   Hepatitis C Screening  Completed   HPV VACCINES  Aged Out    Health Maintenance  Health Maintenance Due  Topic Date Due   Zoster Vaccines- Shingrix (1 of 2) Never done   URINE MICROALBUMIN  02/12/2021   COVID-19 Vaccine (4 - Booster for [place] series) 04/24/2021    Colorectal cancer screening: Type of screening: Colonoscopy. Completed 09/23/2020. Repeat every 3 years  Lung Cancer Screening: (Low Dose CT Chest recommended if Age 67-80 years, 30 pack-year currently smoking OR have quit w/in 15years.) does qualify.   Lung Cancer Screening Referral: no  Additional Screening:  Hepatitis C Screening: does qualify; Completed yes  Vision Screening: Recommended annual ophthalmology exams for early detection of glaucoma and other disorders of the eye. Is the patient up to date with their annual eye exam?  Yes  Who is the provider or what is the name of the office in which the patient attends annual eye exams? Marica Otter, OD. If pt is not established with a provider, would they like to be referred to a provider to establish care? No .   Dental Screening: Recommended annual dental exams for proper oral hygiene  Community Resource Referral / Chronic Care Management: CRR required this visit?  No   CCM required this visit?  No       Plan:     I have personally reviewed and noted the following in the patient's chart:   Medical and social history Use of alcohol, tobacco or illicit drugs  Current medications and supplements including opioid prescriptions. Patient is not currently taking opioid prescriptions. Functional ability and status Nutritional status Physical activity Advanced directives List of other physicians Hospitalizations, surgeries, and ER visits in previous 12 months Vitals Screenings to include cognitive, depression, and falls Referrals and appointments  In addition, I have reviewed and discussed with patient certain preventive protocols, quality metrics, and best practice recommendations. A written personalized care plan for preventive services as well as general preventive health recommendations were provided to patient.     Sheral Flow, LPN   2/83/1517   Nurse Notes: n/a

## 2021-06-29 NOTE — Patient Instructions (Addendum)
Mr. Henry Ellis , Thank you for taking time to come for your Medicare Wellness Visit. I appreciate your ongoing commitment to your health goals. Please review the following plan we discussed and let me know if I can assist you in the future.   Screening recommendations/referrals: Colonoscopy: last done 09/23/2020; due every 3 years Recommended yearly ophthalmology/optometry visit for glaucoma screening and checkup Recommended yearly dental visit for hygiene and checkup  Vaccinations: Influenza vaccine: 09/16/2020 Pneumococcal vaccine: 10/19/2016, 08/01/2019 Tdap vaccine: 08/01/2019; due every 10 years Shingles vaccine: Please call your insurance company to determine your out of pocket expense for the Shingrix vaccine. You may receive this vaccine at your local pharmacy.   Covid-19: 01/09/2020, 01/30/2020, 01/25/2021  Advanced directives: Documents on file.  Conditions/risks identified: Client understands the importance of follow-up with providers by attending scheduled visits and discussed goals to eat healthier, increase physical activity, exercise the brain, socialize more, get enough rest and make time for laughter.  Next appointment: Please schedule your next Medicare Wellness Visit with your Nurse Health Advisor in 1 year by calling [phone].  Preventive Care 71 Years and Older, Male Preventive care refers to lifestyle choices and visits with your health care provider that can promote health and wellness. What does preventive care include? A yearly physical exam. This is also called an annual well check. Dental exams once or twice a year. Routine eye exams. Ask your health care provider how often you should have your eyes checked. Personal lifestyle choices, including: Daily care of your teeth and gums. Regular physical activity. Eating a healthy diet. Avoiding tobacco and drug use. Limiting alcohol use. Practicing safe sex. Taking low doses of aspirin every day. Taking vitamin and  mineral supplements as recommended by your health care provider. What happens during an annual well check? The services and screenings done by your health care provider during your annual well check will depend on your age, overall health, lifestyle risk factors, and family history of disease. Counseling  Your health care provider may ask you questions about your: Alcohol use. Tobacco use. Drug use. Emotional well-being. Home and relationship well-being. Sexual activity. Eating habits. History of falls. Memory and ability to understand (cognition). Work and work Statistician. Screening  You may have the following tests or measurements: Height, weight, and BMI. Blood pressure. Lipid and cholesterol levels. These may be checked every 5 years, or more frequently if you are over 58 years old. Skin check. Lung cancer screening. You may have this screening every year starting at age 39 if you have a 30-pack-year history of smoking and currently smoke or have quit within the past 15 years. Fecal occult blood test (FOBT) of the stool. You may have this test every year starting at age 42. Flexible sigmoidoscopy or colonoscopy. You may have a sigmoidoscopy every 5 years or a colonoscopy every 10 years starting at age 56. Prostate cancer screening. Recommendations will vary depending on your family history and other risks. Hepatitis C blood test. Hepatitis B blood test. Sexually transmitted disease (STD) testing. Diabetes screening. This is done by checking your blood sugar (glucose) after you have not eaten for a while (fasting). You may have this done every 1-3 years. Abdominal aortic aneurysm (AAA) screening. You may need this if you are a current or former smoker. Osteoporosis. You may be screened starting at age 46 if you are at high risk. Talk with your health care provider about your test results, treatment options, and if necessary, the need for more tests. Vaccines  Your health care  provider may recommend certain vaccines, such as: Influenza vaccine. This is recommended every year. Tetanus, diphtheria, and acellular pertussis (Tdap, Td) vaccine. You may need a Td booster every 10 years. Zoster vaccine. You may need this after age 25. Pneumococcal 13-valent conjugate (PCV13) vaccine. One dose is recommended after age 29. Pneumococcal polysaccharide (PPSV23) vaccine. One dose is recommended after age 2. Talk to your health care provider about which screenings and vaccines you need and how often you need them. This information is not intended to replace advice given to you by your health care provider. Make sure you discuss any questions you have with your health care provider. Document Released: 12/31/2015 Document Revised: 08/23/2016 Document Reviewed: 10/05/2015 Elsevier Interactive Patient Education  2017 [place] Prevention in the Home Falls can cause injuries. They can happen to people of all ages. There are many things you can do to make your home safe and to help prevent falls. What can I do on the outside of my home? Regularly fix the edges of walkways and driveways and fix any cracks. Remove anything that might make you trip as you walk through a door, such as a raised step or threshold. Trim any bushes or trees on the path to your home. Use bright outdoor lighting. Clear any walking paths of anything that might make someone trip, such as rocks or tools. Regularly check to see if handrails are loose or broken. Make sure that both sides of any steps have handrails. Any raised decks and porches should have guardrails on the edges. Have any leaves, snow, or ice cleared regularly. Use sand or salt on walking paths during winter. Clean up any spills in your garage right away. This includes oil or grease spills. What can I do in the bathroom? Use night lights. Install grab bars by the toilet and in the tub and shower. Do not use towel bars as grab  bars. Use non-skid mats or decals in the tub or shower. If you need to sit down in the shower, use a plastic, non-slip stool. Keep the floor dry. Clean up any water that spills on the floor as soon as it happens. Remove soap buildup in the tub or shower regularly. Attach bath mats securely with double-sided non-slip rug tape. Do not have throw rugs and other things on the floor that can make you trip. What can I do in the bedroom? Use night lights. Make sure that you have a light by your bed that is easy to reach. Do not use any sheets or blankets that are too big for your bed. They should not hang down onto the floor. Have a firm chair that has side arms. You can use this for support while you get dressed. Do not have throw rugs and other things on the floor that can make you trip. What can I do in the kitchen? Clean up any spills right away. Avoid walking on wet floors. Keep items that you use a lot in easy-to-reach places. If you need to reach something above you, use a strong step stool that has a grab bar. Keep electrical cords out of the way. Do not use floor polish or wax that makes floors slippery. If you must use wax, use non-skid floor wax. Do not have throw rugs and other things on the floor that can make you trip. What can I do with my stairs? Do not leave any items on the stairs. Make sure that there are  handrails on both sides of the stairs and use them. Fix handrails that are broken or loose. Make sure that handrails are as long as the stairways. Check any carpeting to make sure that it is firmly attached to the stairs. Fix any carpet that is loose or worn. Avoid having throw rugs at the top or bottom of the stairs. If you do have throw rugs, attach them to the floor with carpet tape. Make sure that you have a light switch at the top of the stairs and the bottom of the stairs. If you do not have them, ask someone to add them for you. What else can I do to help prevent  falls? Wear shoes that: Do not have high heels. Have rubber bottoms. Are comfortable and fit you well. Are closed at the toe. Do not wear sandals. If you use a stepladder: Make sure that it is fully opened. Do not climb a closed stepladder. Make sure that both sides of the stepladder are locked into place. Ask someone to hold it for you, if possible. Clearly mark and make sure that you can see: Any grab bars or handrails. First and last steps. Where the edge of each step is. Use tools that help you move around (mobility aids) if they are needed. These include: Canes. Walkers. Scooters. Crutches. Turn on the lights when you go into a dark area. Replace any light bulbs as soon as they burn out. Set up your furniture so you have a clear path. Avoid moving your furniture around. If any of your floors are uneven, fix them. If there are any pets around you, be aware of where they are. Review your medicines with your doctor. Some medicines can make you feel dizzy. This can increase your chance of falling. Ask your doctor what other things that you can do to help prevent falls. This information is not intended to replace advice given to you by your health care provider. Make sure you discuss any questions you have with your health care provider. Document Released: 09/30/2009 Document Revised: 05/11/2016 Document Reviewed: 01/08/2015 Elsevier Interactive Patient Education  2017 [employer].

## 2021-06-30 DIAGNOSIS — L600 Ingrowing nail: Secondary | ICD-10-CM | POA: Diagnosis not present

## 2021-06-30 DIAGNOSIS — M79672 Pain in left foot: Secondary | ICD-10-CM | POA: Diagnosis not present

## 2021-06-30 DIAGNOSIS — M79671 Pain in right foot: Secondary | ICD-10-CM | POA: Diagnosis not present

## 2021-06-30 DIAGNOSIS — B351 Tinea unguium: Secondary | ICD-10-CM | POA: Diagnosis not present

## 2021-07-05 ENCOUNTER — Other Ambulatory Visit (HOSPITAL_COMMUNITY): Payer: Self-pay | Admitting: Urology

## 2021-07-05 ENCOUNTER — Ambulatory Visit (HOSPITAL_COMMUNITY)
Admission: RE | Admit: 2021-07-05 | Discharge: 2021-07-05 | Disposition: A | Discharge status: Home / Self Care | Payer: Medicare Other | Source: Ambulatory Visit | Attending: Urology | Admitting: Urology

## 2021-07-05 ENCOUNTER — Other Ambulatory Visit: Payer: Self-pay

## 2021-07-05 DIAGNOSIS — C642 Malignant neoplasm of left kidney, except renal pelvis: Secondary | ICD-10-CM | POA: Diagnosis not present

## 2021-07-05 DIAGNOSIS — I517 Cardiomegaly: Secondary | ICD-10-CM | POA: Diagnosis not present

## 2021-07-26 DIAGNOSIS — R972 Elevated prostate specific antigen [PSA]: Secondary | ICD-10-CM | POA: Diagnosis not present

## 2021-07-26 DIAGNOSIS — C642 Malignant neoplasm of left kidney, except renal pelvis: Secondary | ICD-10-CM | POA: Diagnosis not present

## 2021-07-26 DIAGNOSIS — N281 Cyst of kidney, acquired: Secondary | ICD-10-CM | POA: Diagnosis not present

## 2021-08-19 ENCOUNTER — Other Ambulatory Visit: Payer: Self-pay

## 2021-08-19 ENCOUNTER — Other Ambulatory Visit (INDEPENDENT_AMBULATORY_CARE_PROVIDER_SITE_OTHER): Payer: Medicare Other

## 2021-08-19 DIAGNOSIS — N1831 Chronic kidney disease, stage 3a: Secondary | ICD-10-CM | POA: Diagnosis not present

## 2021-08-19 DIAGNOSIS — R7302 Impaired glucose tolerance (oral): Secondary | ICD-10-CM | POA: Diagnosis not present

## 2021-08-19 DIAGNOSIS — I10 Essential (primary) hypertension: Secondary | ICD-10-CM

## 2021-08-19 DIAGNOSIS — E559 Vitamin D deficiency, unspecified: Secondary | ICD-10-CM

## 2021-08-19 LAB — CBC WITH DIFFERENTIAL/PLATELET
Basophils Absolute: 0.1 10*3/uL (ref 0.0–0.1)
Basophils Relative: 1.2 % (ref 0.0–3.0)
Eosinophils Absolute: 0.2 10*3/uL (ref 0.0–0.7)
Eosinophils Relative: 3.5 % (ref 0.0–5.0)
HCT: 38.4 % — ABNORMAL LOW (ref 39.0–52.0)
Hemoglobin: 12.8 g/dL — ABNORMAL LOW (ref 13.0–17.0)
Lymphocytes Relative: 20.3 % (ref 12.0–46.0)
Lymphs Abs: 1.3 10*3/uL (ref 0.7–4.0)
MCHC: 33.4 g/dL (ref 30.0–36.0)
MCV: 94.4 fl (ref 78.0–100.0)
Monocytes Absolute: 0.6 10*3/uL (ref 0.1–1.0)
Monocytes Relative: 10 % (ref 3.0–12.0)
Neutro Abs: 4.1 10*3/uL (ref 1.4–7.7)
Neutrophils Relative %: 65 % (ref 43.0–77.0)
Platelets: 218 10*3/uL (ref 150.0–400.0)
RBC: 4.07 Mil/uL — ABNORMAL LOW (ref 4.22–5.81)
RDW: 14.9 % (ref 11.5–15.5)
WBC: 6.4 10*3/uL (ref 4.0–10.5)

## 2021-08-19 LAB — VITAMIN D 25 HYDROXY (VIT D DEFICIENCY, FRACTURES): VITD: 44.46 ng/mL (ref 30.00–100.00)

## 2021-08-19 LAB — LIPID PANEL
Cholesterol: 148 mg/dL (ref 0–200)
HDL: 49.1 mg/dL (ref >39.00)
LDL Cholesterol: 76 mg/dL (ref 0–99)
NonHDL: 99.13
Total CHOL/HDL Ratio: 3
Triglycerides: 115 mg/dL (ref 0.0–149.0)
VLDL: 23 mg/dL (ref 0.0–40.0)

## 2021-08-19 LAB — HEPATIC FUNCTION PANEL
ALT: 9 U/L (ref 0–53)
AST: 11 U/L (ref 0–37)
Albumin: 3.9 g/dL (ref 3.5–5.2)
Alkaline Phosphatase: 88 U/L (ref 39–117)
Bilirubin, Direct: 0.1 mg/dL (ref 0.0–0.3)
Total Bilirubin: 0.6 mg/dL (ref 0.2–1.2)
Total Protein: 6.8 g/dL (ref 6.0–8.3)

## 2021-08-19 LAB — BASIC METABOLIC PANEL
BUN: 24 mg/dL — ABNORMAL HIGH (ref 6–23)
CO2: 31 mEq/L (ref 19–32)
Calcium: 9.2 mg/dL (ref 8.4–10.5)
Chloride: 101 mEq/L (ref 96–112)
Creatinine, Ser: 1.71 mg/dL — ABNORMAL HIGH (ref 0.40–1.50)
GFR: 41 mL/min — ABNORMAL LOW (ref >60.00)
Glucose, Bld: 79 mg/dL (ref 70–99)
Potassium: 5 mEq/L (ref 3.5–5.1)
Sodium: 138 mEq/L (ref 135–145)

## 2021-08-19 LAB — PHOSPHORUS: Phosphorus: 3.5 mg/dL (ref 2.3–4.6)

## 2021-08-19 LAB — HEMOGLOBIN A1C: Hgb A1c MFr Bld: 6 % (ref 4.6–6.5)

## 2021-08-22 LAB — PTH, INTACT AND CALCIUM
Calcium: 9.1 mg/dL (ref 8.6–10.3)
PTH: 104 pg/mL — ABNORMAL HIGH (ref 16–77)

## 2021-08-26 ENCOUNTER — Telehealth: Payer: Self-pay

## 2021-08-26 ENCOUNTER — Ambulatory Visit (INDEPENDENT_AMBULATORY_CARE_PROVIDER_SITE_OTHER): Payer: Medicare Other | Admitting: Internal Medicine

## 2021-08-26 ENCOUNTER — Encounter: Payer: Self-pay | Admitting: Internal Medicine

## 2021-08-26 ENCOUNTER — Other Ambulatory Visit: Payer: Self-pay

## 2021-08-26 ENCOUNTER — Telehealth: Payer: Self-pay | Admitting: Internal Medicine

## 2021-08-26 VITALS — BP 138/80 | HR 59 | Temp 98.8°F | Ht 73.0 in | Wt 316.4 lb

## 2021-08-26 DIAGNOSIS — R7302 Impaired glucose tolerance (oral): Secondary | ICD-10-CM | POA: Diagnosis not present

## 2021-08-26 DIAGNOSIS — E7800 Pure hypercholesterolemia, unspecified: Secondary | ICD-10-CM | POA: Diagnosis not present

## 2021-08-26 DIAGNOSIS — I10 Essential (primary) hypertension: Secondary | ICD-10-CM

## 2021-08-26 DIAGNOSIS — Z23 Encounter for immunization: Secondary | ICD-10-CM | POA: Diagnosis not present

## 2021-08-26 DIAGNOSIS — E538 Deficiency of other specified B group vitamins: Secondary | ICD-10-CM

## 2021-08-26 DIAGNOSIS — N1831 Chronic kidney disease, stage 3a: Secondary | ICD-10-CM

## 2021-08-26 DIAGNOSIS — Z0000 Encounter for general adult medical examination without abnormal findings: Secondary | ICD-10-CM

## 2021-08-26 DIAGNOSIS — E559 Vitamin D deficiency, unspecified: Secondary | ICD-10-CM

## 2021-08-26 MED ORDER — DAPAGLIFLOZIN PROPANEDIOL 5 MG PO TABS: 5.0000 mg | ORAL_TABLET | Freq: Every day | ORAL | 3 refills | Status: DC

## 2021-08-26 NOTE — Progress Notes (Signed)
Patient ID: Henry Ellis, male   DOB: 1956/06/22, 65 y.o.   MRN: [id number]        Chief Complaint: follow up HTN, HLD and hyperglycemia , ckd       HPI:  Henry Ellis is a 65 y.o. male here overall doing ok, Pt denies chest pain, increased sob or doe, wheezing, orthopnea, PND, increased LE swelling, palpitations, dizziness or syncope.  . Pt denies polydipsia, polyuria, or new focal neuro s/s.   Pt denies fever, wt loss, night sweats, loss of appetite, or other constitutional symptoms  Taking Vit D   BP at home < 140/90.  No other new complaints       Wt Readings from Last 3 Encounters:  08/26/21 (!) 316 lb 6.4 oz (143.5 kg)  06/29/21 (!) 322 lb 6.4 oz (146.2 kg)  03/11/21 (!) 310 lb (140.6 kg)   BP Readings from Last 3 Encounters:  08/26/21 138/80  06/29/21 120/60  03/11/21 (!) 144/80         Past Medical History:  Diagnosis Date   Allergy    seasonal allergies   Anemia    on meds   Anxiety    hx of   Arthritis    generalized   Barrett's esophagus    Cancer ([place]) 10/17/2016   kidney cancer(removed left kidney in 2017)   Carpal tunnel syndrome, right 04/21/2011   Chronic bronchitis    Chronic kidney disease 2017   2017-had left kidney removed   ED (erectile dysfunction)    Esophageal reflux    on meds   Esophageal stricture    Essential hypertension 10/19/2015   on meds   H/O adenomatous polyp of colon 06/05/2012   Hiatal hernia    High cholesterol    on meds   History of Bell's palsy    IBS (irritable bowel syndrome)    Personal history of colonic polyps 04/15/2008 & 04/22/2012   TUBULAR ADENOMA   Ulcerative esophagitis    Past Surgical History:  Procedure Laterality Date   CARPAL TUNNEL RELEASE     left   INGUINAL HERNIA REPAIR     bilateral   KNEE ARTHROSCOPY     right [name]   ROBOT ASSISTED LAPAROSCOPIC NEPHRECTOMY Left 10/18/2016   Procedure: XI ROBOTIC ASSISTED LAPAROSCOPIC RADICAL NEPHRECTOMY;  Surgeon: Alexis Frock, MD;  Location: [facility];   Service: Urology;  Laterality: Left;   WISDOM TOOTH EXTRACTION      reports that he quit smoking about 4 years ago. His smoking use included cigarettes. He smoked an average of 1 pack per day. He has never used smokeless tobacco. He reports current alcohol use of about 3.0 standard drinks per week. He reports that he does not currently use drugs after having used the following drugs: Marijuana. Frequency: 2.00 times per week. family history includes Heart disease in his father; Hypertension in his brother; Lung cancer in his father. Allergies  Allergen Reactions   Bee Venom Anaphylaxis   Aspirin Nausea Only   Amoxicillin Rash    Did PCN reaction causing immediate rash, facial/tongue/throat swelling, SOB or lightheadedness with hypotension: no Did PCN reaction causing severe rash involving mucus membranes or skin necrosis: no Did PCN reaction that required hospitalization : no Did pcn reaction occurring within the last 10 years: no If all of the above answers are "NO", then may proceed with Cephalosporin use. Pt states he can take low doses of amoxicillin, has taken for dental procedures without reaction  Other Rash    Aspirin cream    Current Outpatient Medications on File Prior to Visit  Medication Sig Dispense Refill   amLODipine (NORVASC) 2.5 MG tablet Take 1 tablet (2.5 mg total) by mouth daily. 90 tablet 3   atorvastatin (LIPITOR) 20 MG tablet TAKE 1 TABLET(20 MG) BY MOUTH DAILY AT 6 PM 90 tablet 3   Cholecalciferol (VITAMIN D3 PO) Take by mouth.     docusate sodium (COLACE) 100 MG capsule Take 100 mg by mouth daily.     esomeprazole (NEXIUM) 20 MG capsule Take 1 capsule (20 mg total) by mouth daily at 12 noon. 90 capsule 3   ferrous sulfate 325 (65 FE) MG tablet Take 325 mg by mouth daily with breakfast.     finasteride (PROSCAR) 5 MG tablet Take 5 mg by mouth daily.     Psyllium 30.9 % POWD Take 1 packet by mouth daily.     solifenacin (VESICARE) 5 MG tablet Take 1 tablet (5 mg  total) by mouth daily. 90 tablet 3   tamsulosin (FLOMAX) 0.4 MG CAPS capsule Take 1 capsule (0.4 mg total) by mouth daily. (Patient not taking: No sig reported) 90 capsule 3   No current facility-administered medications on file prior to visit.        ROS:  All others reviewed and negative.  Objective        PE:  BP 138/80 (BP Location: Right Arm, Patient Position: Sitting, Cuff Size: Large)   Pulse (!) 59   Temp 98.8 F (37.1 C) (Oral)   Ht '6\' 1"'$  (1.854 m)   Wt (!) 316 lb 6.4 oz (143.5 kg)   SpO2 98%   BMI 41.74 kg/m                 Constitutional: Pt appears in NAD               HENT: Head: NCAT.                Right Ear: External ear normal.                 Left Ear: External ear normal.                Eyes: . Pupils are equal, round, and reactive to light. Conjunctivae and EOM are normal               Nose: without d/c or deformity               Neck: Neck supple. Gross normal ROM               Cardiovascular: Normal rate and regular rhythm.                 Pulmonary/Chest: Effort normal and breath sounds without rales or wheezing.                Abd:  Soft, NT, ND, + BS, no organomegaly               Neurological: Pt is alert. At baseline orientation, motor grossly intact               Skin: Skin is warm. No rashes, no other new lesions, LE edema - none               Psychiatric: Pt behavior is normal without agitation   Micro: none  Cardiac tracings I have personally interpreted today:  none  Pertinent Radiological  findings (summarize): none   Lab Results  Component Value Date   WBC 6.4 08/19/2021   HGB 12.8 (L) 08/19/2021   HCT 38.4 (L) 08/19/2021   PLT 218.0 08/19/2021   GLUCOSE 79 08/19/2021   CHOL 148 08/19/2021   TRIG 115.0 08/19/2021   HDL 49.10 08/19/2021   LDLDIRECT 151.7 03/25/2010   LDLCALC 76 08/19/2021   ALT 9 08/19/2021   AST 11 08/19/2021   NA 138 08/19/2021   K 5.0 08/19/2021   CL 101 08/19/2021   CREATININE 1.71 (H) 08/19/2021   BUN 24  (H) 08/19/2021   CO2 31 08/19/2021   TSH 1.09 02/11/2021   PSA 2.20 02/11/2021   HGBA1C 6.0 08/19/2021   MICROALBUR 2.4 (H) 02/13/2020   Assessment/Plan:  Henry Ellis is a 65 y.o. White or Caucasian [1] male with  has a past medical history of Allergy, Anemia, Anxiety, Arthritis, Barrett's esophagus, Cancer ([place]) (10/17/2016), Carpal tunnel syndrome, right (04/21/2011), Chronic bronchitis, Chronic kidney disease (2017), ED (erectile dysfunction), Esophageal reflux, Esophageal stricture, Essential hypertension (10/19/2015), H/O adenomatous polyp of colon (06/05/2012), Hiatal hernia, High cholesterol, History of Bell's palsy, IBS (irritable bowel syndrome), Personal history of colonic polyps (04/15/2008 & 04/22/2012), and Ulcerative esophagitis.  Vitamin D deficiency Last vitamin D Lab Results  Component Value Date   VD25OH 44.46 08/19/2021   Stable, cont oral replacement   Impaired glucose tolerance Lab Results  Component Value Date   HGBA1C 6.0 08/19/2021   stable, pt to continue current medical treatment  - diet, but also added farxiga today with ckd   HYPERCHOLESTEROLEMIA Lab Results  Component Value Date   LDLCALC 76 08/19/2021   Mild uncontrolled, goal ldl < 70,, pt to continue current statin lipitor as declines chagne   Essential hypertension BP Readings from Last 3 Encounters:  08/26/21 138/80  06/29/21 120/60  03/11/21 (!) 144/80   Stable, pt to continue medical treatment norvasc   CKD (chronic kidney disease) Lab Results  Component Value Date   CREATININE 1.71 (H) 08/19/2021   Stable overall, cont to avoid nephrotoxins, and add farxiga 5 qd  Followup: Return in about 6 months (around 02/23/2022).  Cathlean Cower, MD 08/27/2021 6:44 PM [facility]

## 2021-08-26 NOTE — Telephone Encounter (Signed)
Patient states the medication( Dapagliflozin Propanediol ) is not covered his insurance. Requesting an alternative to be sent to the same pharmacy.   Please advise.

## 2021-08-26 NOTE — Telephone Encounter (Signed)
We had discussed at the visit that if the medication is not covered, he would ask at the pharmacy or call his insurance about which medication like farxiga is covered, as usually at least one of them is, so if he could that, that would be great

## 2021-08-26 NOTE — Patient Instructions (Signed)
Please take all new medication as prescribed - the farxiga 5 mg per day  Please continue all other medications as before, and refills have been done if requested.  Please have the pharmacy call with any other refills you may need.  Please continue your efforts at being more active, low cholesterol diet, and weight control.  Please keep your appointments with your specialists as you may have planned  Please make an Appointment to return in 6 months, or sooner if needed, also with Lab Appointment for testing done 3-5 days before at the [facility] (so this is for TWO appointments - please see the scheduling desk as you leave)  Due to the ongoing Covid 19 pandemic, our lab now requires an appointment for any labs done at our office.  If you need labs done and do not have an appointment, please call our office ahead of time to schedule before presenting to the lab for your testing.

## 2021-08-27 ENCOUNTER — Encounter: Payer: Self-pay | Admitting: Internal Medicine

## 2021-08-27 NOTE — Assessment & Plan Note (Addendum)
Lab Results  Component Value Date   HGBA1C 6.0 08/19/2021   stable, pt to continue current medical treatment  - diet, but also added farxiga today with ckd

## 2021-08-27 NOTE — Assessment & Plan Note (Signed)
Last vitamin D Lab Results  Component Value Date   VD25OH 44.46 08/19/2021   Stable, cont oral replacement

## 2021-08-27 NOTE — Assessment & Plan Note (Signed)
Lab Results  Component Value Date   LDLCALC 76 08/19/2021   Mild uncontrolled, goal ldl < 70,, pt to continue current statin lipitor as declines chagne

## 2021-08-27 NOTE — Assessment & Plan Note (Signed)
BP Readings from Last 3 Encounters:  08/26/21 138/80  06/29/21 120/60  03/11/21 (!) 144/80   Stable, pt to continue medical treatment norvasc

## 2021-08-27 NOTE — Assessment & Plan Note (Signed)
Lab Results  Component Value Date   CREATININE 1.71 (H) 08/19/2021   Stable overall, cont to avoid nephrotoxins, and add farxiga 5 qd

## 2021-08-29 MED ORDER — CANAGLIFLOZIN 100 MG PO TABS: 100.0000 mg | ORAL_TABLET | Freq: Every day | ORAL | 3 refills | Status: DC

## 2021-08-29 NOTE — Telephone Encounter (Signed)
Fax received from insurance. Glyxambi, Invokana, Januvia, Jardiance, Levemir, Metformin, and Ozempic are alternatives for patient. Please advise

## 2021-08-29 NOTE — Addendum Note (Signed)
Addended by: Biagio Borg on: 08/29/2021 07:36 PM   Modules accepted: Orders

## 2021-08-29 NOTE — Telephone Encounter (Signed)
Patient notified

## 2021-08-29 NOTE — Telephone Encounter (Signed)
Ok invokana done erx

## 2021-08-30 NOTE — Telephone Encounter (Signed)
Patient notified

## 2021-09-12 ENCOUNTER — Other Ambulatory Visit: Payer: Medicare Other | Admitting: Internal Medicine

## 2021-09-12 ENCOUNTER — Other Ambulatory Visit: Payer: Self-pay

## 2021-09-12 ENCOUNTER — Encounter: Payer: Self-pay | Admitting: Internal Medicine

## 2021-09-12 ENCOUNTER — Ambulatory Visit: Payer: Medicare Other | Admitting: Internal Medicine

## 2021-09-12 VITALS — BP 132/70 | HR 56 | Ht 73.0 in | Wt 313.8 lb

## 2021-09-12 DIAGNOSIS — J449 Chronic obstructive pulmonary disease, unspecified: Secondary | ICD-10-CM

## 2021-09-12 DIAGNOSIS — Z905 Acquired absence of kidney: Secondary | ICD-10-CM

## 2021-09-12 DIAGNOSIS — R609 Edema, unspecified: Secondary | ICD-10-CM

## 2021-09-12 DIAGNOSIS — I700 Atherosclerosis of aorta: Secondary | ICD-10-CM

## 2021-09-12 DIAGNOSIS — E7800 Pure hypercholesterolemia, unspecified: Secondary | ICD-10-CM

## 2021-09-12 DIAGNOSIS — R350 Frequency of micturition: Secondary | ICD-10-CM

## 2021-09-12 DIAGNOSIS — I10 Essential (primary) hypertension: Secondary | ICD-10-CM

## 2021-09-12 MED ORDER — ATORVASTATIN CALCIUM 40 MG PO TABS: 40.0000 mg | ORAL_TABLET | Freq: Every day | ORAL | 3 refills | Status: DC

## 2021-09-12 NOTE — Progress Notes (Signed)
Cardiology Office Note:    Date:  09/12/2021   ID:  Henry, Ellis Apr 12, 1956, MRN [id number]  PCP:  Henry Borg, MD   [place]  Cardiologist:  Henry Lean, MD  Advanced Practice Provider:  No care team member to display Electrophysiologist:  None      CC: Follow up leg swelling   History of Present Illness:    Henry Ellis is a 65 y.o. male with a hx of HTN, Aortic Atherosclerosis HLD, Distant smoking in 2017; morbid obesiity who presents fore evaluation 03/11/21. In interim of this visit, patient had normal echo with stable leg swelling.  Seen 09/12/21.  Patient notes that he is doing well.  Since last visit notes no changes.  Relevant interval testing or therapy include echocardiogram.  There are no interval hospital/ED visit.    No chest pain or pressure .  No SOB/DOE and no PND/Orthopnea.  No change in weight and still has leg swelling.  No palpitations or syncope.  Ambulatory blood pressure 130/70s.   Past Medical History:  Diagnosis Date   Allergy    seasonal allergies   Anemia    on meds   Anxiety    hx of   Arthritis    generalized   Barrett's esophagus    Cancer ([facility]) 10/17/2016   kidney cancer(removed left kidney in 2017)   Carpal tunnel syndrome, right 04/21/2011   Chronic bronchitis    Chronic kidney disease 2017   2017-had left kidney removed   ED (erectile dysfunction)    Esophageal reflux    on meds   Esophageal stricture    Essential hypertension 10/19/2015   on meds   H/O adenomatous polyp of colon 06/05/2012   Hiatal hernia    High cholesterol    on meds   History of Bell's palsy    IBS (irritable bowel syndrome)    Personal history of colonic polyps 04/15/2008 & 04/22/2012   TUBULAR ADENOMA   Ulcerative esophagitis     Past Surgical History:  Procedure Laterality Date   CARPAL TUNNEL RELEASE     left   INGUINAL HERNIA REPAIR     bilateral   KNEE ARTHROSCOPY     right [name]   ROBOT  ASSISTED LAPAROSCOPIC NEPHRECTOMY Left 10/18/2016   Procedure: XI ROBOTIC ASSISTED LAPAROSCOPIC RADICAL NEPHRECTOMY;  Surgeon: Henry Frock, MD;  Location: [facility];  Service: Urology;  Laterality: Left;   WISDOM TOOTH EXTRACTION      Current Medications: Current Meds  Medication Sig   amLODipine (NORVASC) 2.5 MG tablet Take 1 tablet (2.5 mg total) by mouth daily.   atorvastatin (LIPITOR) 40 MG tablet Take 1 tablet (40 mg total) by mouth daily.   canagliflozin (INVOKANA) 100 MG TABS tablet Take 1 tablet (100 mg total) by mouth daily before breakfast.   Cholecalciferol (VITAMIN D3 PO) Take by mouth.   docusate sodium (COLACE) 100 MG capsule Take 100 mg by mouth daily.   esomeprazole (NEXIUM) 20 MG capsule Take 1 capsule (20 mg total) by mouth daily at 12 noon.   ferrous sulfate 325 (65 FE) MG tablet Take 325 mg by mouth daily with breakfast.   finasteride (PROSCAR) 5 MG tablet Take 5 mg by mouth daily.   Psyllium 30.9 % POWD Take 1 packet by mouth daily.   solifenacin (VESICARE) 5 MG tablet Take 1 tablet (5 mg total) by mouth daily.   [DISCONTINUED] atorvastatin (LIPITOR) 20 MG tablet TAKE 1 TABLET(20  MG) BY MOUTH DAILY AT 6 PM   [DISCONTINUED] tamsulosin (FLOMAX) 0.4 MG CAPS capsule Take 1 capsule (0.4 mg total) by mouth daily.     Allergies:   Bee venom, Aspirin, Amoxicillin, and Other   Social History   Socioeconomic History   Marital status: Single    Spouse name: Not on file   Number of children: Not on file   Years of education: Not on file   Highest education level: Not on file  Occupational History   Occupation: Dealer    Employer: [employer]  Tobacco Use   Smoking status: Former    Packs/day: 1.00    Types: Cigarettes    Quit date: 10/17/2016    Years since quitting: 4.9   Smokeless tobacco: Never   Tobacco comments:    Counseling sheet given 03-2012   Vaping Use   Vaping Use: Former  Substance and Sexual Activity   Alcohol use: Yes    Alcohol/week:  3.0 standard drinks    Types: 3 Shots of liquor per week   Drug use: Not Currently    Frequency: 2.0 times per week    Types: Marijuana    Comment: last use June 05, 2016   Sexual activity: Not on file  Other Topics Concern   Not on file  Social History Narrative   Not on file   Social Determinants of Health   Financial Resource Strain: Low Risk    Difficulty of Paying Living Expenses: Not hard at all  Food Insecurity: No Food Insecurity   Worried About Charity fundraiser in the Last Year: Never true   [place] in the Last Year: Never true  Transportation Needs: No Transportation Needs   Lack of Transportation (Medical): No   Lack of Transportation (Non-Medical): No  Physical Activity: Sufficiently Active   Days of Exercise per Week: 5 days   Minutes of Exercise per Session: 30 min  Stress: No Stress Concern Present   Feeling of Stress : Not at all  Social Connections: Socially Isolated   Frequency of Communication with Friends and Family: Three times a week   Frequency of Social Gatherings with Friends and Family: Once a week   Attends Religious Services: Never   Marine scientist or Organizations: No   Attends Music therapist: Never   Marital Status: Never married    Social:  came back from [place] with his nieces and nephews every summer  Family History: The patient's family history includes Heart disease in his father; Hypertension in his brother; Lung cancer in his father. There is no history of Colon cancer, Stomach cancer, Esophageal cancer, Rectal cancer, or Colon polyps.  History of coronary artery disease notable for father and oldest brother. History of heart failure notable for no members. History of arrhythmia notable for sister has pacemaker.  ROS:   Please see the history of present illness.     All other systems reviewed and are negative.  EKGs/Labs/Other Studies Reviewed:    The following studies were reviewed  today:  EKG:   02/18/21: SR 1st HB rate 82   Transthoracic Echocardiogram: Date:04/12/21 Results: Borderline AA (WNL for age and BSA) normal LV fx  1. Left ventricular ejection fraction, by estimation, is 60 to 65%. The  left ventricle has normal function. The left ventricle has no regional  wall motion abnormalities. There is mild concentric left ventricular  hypertrophy. Left ventricular diastolic  parameters are indeterminate.  2. Right ventricular systolic function is normal. The right ventricular  size is normal. Tricuspid regurgitation signal is inadequate for assessing  PA pressure.   3. The pericardial effusion is circumferential.   4. The mitral valve is normal in structure. No evidence of mitral valve  regurgitation. No evidence of mitral stenosis.   5. The aortic valve is normal in structure. Aortic valve regurgitation is  not visualized. No aortic stenosis is present.   6. Aortic dilatation noted. There is mild dilatation of the aortic root  and of the ascending aorta, measuring 38 mm.   7. The inferior vena cava is normal in size with greater than 50%  respiratory variability, suggesting right atrial pressure of 3 mmHg.   NonCardiac CT : Date: 10/01/20 Results: Aortic Atherosclerosis without CAC  NM Stress Testing: Date: 04/05/2012 Results:  Negative per report  Recent Labs: 02/11/2021: TSH 1.09 08/19/2021: ALT 9; BUN 24; Creatinine, Ser 1.71; Hemoglobin 12.8; Platelets 218.0; Potassium 5.0; Sodium 138  Recent Lipid Panel    Component Value Date/Time   CHOL 148 08/19/2021 0848   TRIG 115.0 08/19/2021 0848   HDL 49.10 08/19/2021 0848   CHOLHDL 3 08/19/2021 0848   VLDL 23.0 08/19/2021 0848   LDLCALC 76 08/19/2021 0848   LDLDIRECT 151.7 03/25/2010 1108    Physical Exam:    VS:  BP 132/70   Pulse (!) 56   Ht 6\' 1"  (1.854 m)   Wt (!) 313 lb 12.8 oz (142.3 kg)   SpO2 98%   BMI 41.40 kg/m     Wt Readings from Last 3 Encounters:  09/12/21 (!) 313 lb 12.8 oz  (142.3 kg)  08/26/21 (!) 316 lb 6.4 oz (143.5 kg)  06/29/21 (!) 322 lb 6.4 oz (146.2 kg)     GEN: Obese well developed in no acute distress HEENT: Normal NECK: No JVD (difficult exam) LYMPHATICS: No lymphadenopathy CARDIAC: RRR, no murmurs, rubs, gallops RESPIRATORY:  Clear to auscultation without rales, wheezing or rhonchi  ABDOMEN: Soft, non-tender, non-distended MUSCULOSKELETAL:  +2 pitting edema; No deformity  SKIN: Warm and dry NEUROLOGIC:  Alert and oriented x 3 PSYCHIATRIC:  Normal affect   ASSESSMENT:    1. Aortic atherosclerosis ([place])   2. Essential hypertension   3. HYPERCHOLESTEROLEMIA   4. Morbid obesity ([place])   5. Peripheral edema   6. Chronic obstructive pulmonary disease, unspecified COPD type ([place])     PLAN:    In order of problems listed above:  Aortic atherosclerosis Aorta is at ULN but WNL for age and BSA; gets yearly CT for former smoking so will not do additional imaging surveillance at this time - LDL at goal <70, will increase Lipitor to 40 mg PO daily; patient prefers to get follow up labs with [facility]- MD - has ASA related nausea but no true allergy, no CAC; will defer primary prevention ASA - reviewed prior CT with patient   CKD IIIa with one kidney LE Swelling Morbid Obesity COPD HTN - offered lasix again; patient will agree to it if his LE swelling is ever accompanied with SOB - discussed compression stocking - continue invokana - if BP is controlled at next visit will trial off norvasc 2.5 mg in the setting of leg swelling  Will plan for six months follow up unless new symptoms or abnormal test results warranting change in plan  Would be reasonable for  APP Follow up    Medication Adjustments/Labs and Tests Ordered: Current medicines are reviewed at length with the patient  today.  Concerns regarding medicines are outlined above.  No orders of the defined types were placed in this encounter.  Meds ordered this encounter  Medications    atorvastatin (LIPITOR) 40 MG tablet    Sig: Take 1 tablet (40 mg total) by mouth daily.    Dispense:  90 tablet    Refill:  3     Patient Instructions  Medication Instructions:  Your physician has recommended you make the following change in your medication:  INCREASE: atorvastatin (Lipitor) to 40 mg by mouth daily *If you need a refill on your cardiac medications before your next appointment, please call your pharmacy*   Lab Work: NONE If you have labs (blood work) drawn today and your tests are completely normal, you will receive your results only by: [place] (if you have MyChart) OR A paper copy in the mail If you have any lab test that is abnormal or we need to change your treatment, we will call you to review the results.   Testing/Procedures: NONE   Follow-Up: At [facility], you and your health needs are our priority.  As part of our continuing mission to provide you with exceptional heart care, we have created designated Provider Care Teams.  These Care Teams include your primary Cardiologist (physician) and Advanced Practice Providers (APPs -  Physician Assistants and Nurse Practitioners) who all work together to provide you with the care you need, when you need it.  We recommend signing up for the patient portal called "MyChart".  Sign up information is provided on this After Visit Summary.  MyChart is used to connect with patients for Virtual Visits (Telemedicine).  Patients are able to view lab/test results, encounter notes, upcoming appointments, etc.  Non-urgent messages can be sent to your provider as well.   To learn more about what you can do with MyChart, go to [email or web address].    Your next appointment:   6 month(s)  The format for your next appointment:   In Person  Provider:   You may see Henry Lean, MD or one of the following Advanced Practice Providers on your designated Care Team:   Melina Copa, PA-C Ermalinda Barrios,  PA-C        Signed, Henry Lean, MD  09/12/2021 10:12 AM    [place]

## 2021-09-12 NOTE — Patient Instructions (Signed)
Medication Instructions:  Your physician has recommended you make the following change in your medication:  INCREASE: atorvastatin (Lipitor) to 40 mg by mouth daily *If you need a refill on your cardiac medications before your next appointment, please call your pharmacy*   Lab Work: NONE If you have labs (blood work) drawn today and your tests are completely normal, you will receive your results only by: [place] (if you have MyChart) OR A paper copy in the mail If you have any lab test that is abnormal or we need to change your treatment, we will call you to review the results.   Testing/Procedures: NONE   Follow-Up: At [facility], you and your health needs are our priority.  As part of our continuing mission to provide you with exceptional heart care, we have created designated Provider Care Teams.  These Care Teams include your primary Cardiologist (physician) and Advanced Practice Providers (APPs -  Physician Assistants and Nurse Practitioners) who all work together to provide you with the care you need, when you need it.  We recommend signing up for the patient portal called "MyChart".  Sign up information is provided on this After Visit Summary.  MyChart is used to connect with patients for Virtual Visits (Telemedicine).  Patients are able to view lab/test results, encounter notes, upcoming appointments, etc.  Non-urgent messages can be sent to your provider as well.   To learn more about what you can do with MyChart, go to [email or web address].    Your next appointment:   6 month(s)  The format for your next appointment:   In Person  Provider:   You may see Werner Lean, MD or one of the following Advanced Practice Providers on your designated Care Team:   Melina Copa, PA-C Ermalinda Barrios, PA-C

## 2021-10-11 DIAGNOSIS — N183 Chronic kidney disease, stage 3 unspecified: Secondary | ICD-10-CM | POA: Diagnosis not present

## 2021-10-21 DIAGNOSIS — E1122 Type 2 diabetes mellitus with diabetic chronic kidney disease: Secondary | ICD-10-CM | POA: Diagnosis not present

## 2021-10-21 DIAGNOSIS — N183 Chronic kidney disease, stage 3 unspecified: Secondary | ICD-10-CM | POA: Diagnosis not present

## 2021-10-21 DIAGNOSIS — I129 Hypertensive chronic kidney disease with stage 1 through stage 4 chronic kidney disease, or unspecified chronic kidney disease: Secondary | ICD-10-CM | POA: Diagnosis not present

## 2021-11-21 ENCOUNTER — Other Ambulatory Visit: Payer: Self-pay | Admitting: *Deleted

## 2021-11-21 DIAGNOSIS — Z87891 Personal history of nicotine dependence: Secondary | ICD-10-CM

## 2021-11-28 ENCOUNTER — Ambulatory Visit (INDEPENDENT_AMBULATORY_CARE_PROVIDER_SITE_OTHER)
Admission: RE | Admit: 2021-11-28 | Discharge: 2021-11-28 | Disposition: A | Discharge status: Home / Self Care | Payer: Medicare Other | Source: Ambulatory Visit | Attending: Internal Medicine | Admitting: Internal Medicine

## 2021-11-28 ENCOUNTER — Other Ambulatory Visit: Payer: Self-pay

## 2021-11-28 DIAGNOSIS — Z87891 Personal history of nicotine dependence: Secondary | ICD-10-CM

## 2021-12-21 ENCOUNTER — Other Ambulatory Visit: Payer: Self-pay

## 2021-12-21 DIAGNOSIS — Z87891 Personal history of nicotine dependence: Secondary | ICD-10-CM

## 2021-12-22 DIAGNOSIS — M79674 Pain in right toe(s): Secondary | ICD-10-CM | POA: Diagnosis not present

## 2021-12-22 DIAGNOSIS — B351 Tinea unguium: Secondary | ICD-10-CM | POA: Diagnosis not present

## 2021-12-22 DIAGNOSIS — L600 Ingrowing nail: Secondary | ICD-10-CM | POA: Diagnosis not present

## 2021-12-22 DIAGNOSIS — M79675 Pain in left toe(s): Secondary | ICD-10-CM | POA: Diagnosis not present

## 2022-02-20 ENCOUNTER — Other Ambulatory Visit (INDEPENDENT_AMBULATORY_CARE_PROVIDER_SITE_OTHER): Payer: Medicare Other

## 2022-02-20 ENCOUNTER — Other Ambulatory Visit: Payer: Self-pay

## 2022-02-20 DIAGNOSIS — Z125 Encounter for screening for malignant neoplasm of prostate: Secondary | ICD-10-CM | POA: Diagnosis not present

## 2022-02-20 DIAGNOSIS — E7800 Pure hypercholesterolemia, unspecified: Secondary | ICD-10-CM | POA: Diagnosis not present

## 2022-02-20 DIAGNOSIS — Z0000 Encounter for general adult medical examination without abnormal findings: Secondary | ICD-10-CM

## 2022-02-20 DIAGNOSIS — E538 Deficiency of other specified B group vitamins: Secondary | ICD-10-CM

## 2022-02-20 DIAGNOSIS — E559 Vitamin D deficiency, unspecified: Secondary | ICD-10-CM

## 2022-02-20 DIAGNOSIS — R7302 Impaired glucose tolerance (oral): Secondary | ICD-10-CM

## 2022-02-20 LAB — CBC WITH DIFFERENTIAL/PLATELET
Basophils Absolute: 0.1 10*3/uL (ref 0.0–0.1)
Basophils Relative: 1.2 % (ref 0.0–3.0)
Eosinophils Absolute: 0.1 10*3/uL (ref 0.0–0.7)
Eosinophils Relative: 1.6 % (ref 0.0–5.0)
HCT: 40.3 % (ref 39.0–52.0)
Hemoglobin: 13.4 g/dL (ref 13.0–17.0)
Lymphocytes Relative: 23.1 % (ref 12.0–46.0)
Lymphs Abs: 1.3 10*3/uL (ref 0.7–4.0)
MCHC: 33.2 g/dL (ref 30.0–36.0)
MCV: 95.1 fl (ref 78.0–100.0)
Monocytes Absolute: 0.6 10*3/uL (ref 0.1–1.0)
Monocytes Relative: 9.9 % (ref 3.0–12.0)
Neutro Abs: 3.6 10*3/uL (ref 1.4–7.7)
Neutrophils Relative %: 64.2 % (ref 43.0–77.0)
Platelets: 216 10*3/uL (ref 150.0–400.0)
RBC: 4.24 Mil/uL (ref 4.22–5.81)
RDW: 14.2 % (ref 11.5–15.5)
WBC: 5.6 10*3/uL (ref 4.0–10.5)

## 2022-02-20 LAB — HEPATIC FUNCTION PANEL
ALT: 9 U/L (ref 0–53)
AST: 11 U/L (ref 0–37)
Albumin: 4.1 g/dL (ref 3.5–5.2)
Alkaline Phosphatase: 71 U/L (ref 39–117)
Bilirubin, Direct: 0.1 mg/dL (ref 0.0–0.3)
Total Bilirubin: 0.6 mg/dL (ref 0.2–1.2)
Total Protein: 6.9 g/dL (ref 6.0–8.3)

## 2022-02-20 LAB — URINALYSIS, ROUTINE W REFLEX MICROSCOPIC
Bilirubin Urine: NEGATIVE
Hgb urine dipstick: NEGATIVE
Ketones, ur: NEGATIVE
Leukocytes,Ua: NEGATIVE
Nitrite: NEGATIVE
RBC / HPF: NONE SEEN (ref 0–?)
Specific Gravity, Urine: 1.01 (ref 1.000–1.030)
Total Protein, Urine: NEGATIVE
Urine Glucose: >=1000 — AB
Urobilinogen, UA: 0.2 (ref 0.0–1.0)
pH: 6 (ref 5.0–8.0)

## 2022-02-20 LAB — LIPID PANEL
Cholesterol: 127 mg/dL (ref 0–200)
HDL: 43.6 mg/dL (ref >39.00)
LDL Cholesterol: 59 mg/dL (ref 0–99)
NonHDL: 83.38
Total CHOL/HDL Ratio: 3
Triglycerides: 123 mg/dL (ref 0.0–149.0)
VLDL: 24.6 mg/dL (ref 0.0–40.0)

## 2022-02-20 LAB — BASIC METABOLIC PANEL
BUN: 30 mg/dL — ABNORMAL HIGH (ref 6–23)
CO2: 28 mEq/L (ref 19–32)
Calcium: 9.3 mg/dL (ref 8.4–10.5)
Chloride: 101 mEq/L (ref 96–112)
Creatinine, Ser: 1.54 mg/dL — ABNORMAL HIGH (ref 0.40–1.50)
GFR: 46.32 mL/min — ABNORMAL LOW (ref >60.00)
Glucose, Bld: 88 mg/dL (ref 70–99)
Potassium: 4.8 mEq/L (ref 3.5–5.1)
Sodium: 138 mEq/L (ref 135–145)

## 2022-02-20 LAB — TSH: TSH: 0.83 u[IU]/mL (ref 0.35–5.50)

## 2022-02-20 LAB — VITAMIN B12: Vitamin B-12: 275 pg/mL (ref 211–911)

## 2022-02-20 LAB — HEMOGLOBIN A1C: Hgb A1c MFr Bld: 6.1 % (ref 4.6–6.5)

## 2022-02-20 LAB — VITAMIN D 25 HYDROXY (VIT D DEFICIENCY, FRACTURES): VITD: 49.84 ng/mL (ref 30.00–100.00)

## 2022-02-20 LAB — PSA: PSA: 1.57 ng/mL (ref 0.10–4.00)

## 2022-02-24 ENCOUNTER — Ambulatory Visit (INDEPENDENT_AMBULATORY_CARE_PROVIDER_SITE_OTHER): Payer: Medicare Other | Admitting: Internal Medicine

## 2022-02-24 ENCOUNTER — Other Ambulatory Visit: Payer: Self-pay

## 2022-02-24 ENCOUNTER — Encounter: Payer: Self-pay | Admitting: Internal Medicine

## 2022-02-24 VITALS — BP 134/78 | HR 53 | Temp 98.4°F | Ht 73.0 in | Wt 305.0 lb

## 2022-02-24 DIAGNOSIS — Z23 Encounter for immunization: Secondary | ICD-10-CM

## 2022-02-24 DIAGNOSIS — E559 Vitamin D deficiency, unspecified: Secondary | ICD-10-CM

## 2022-02-24 DIAGNOSIS — Z0001 Encounter for general adult medical examination with abnormal findings: Secondary | ICD-10-CM

## 2022-02-24 DIAGNOSIS — N1831 Chronic kidney disease, stage 3a: Secondary | ICD-10-CM | POA: Diagnosis not present

## 2022-02-24 DIAGNOSIS — I10 Essential (primary) hypertension: Secondary | ICD-10-CM

## 2022-02-24 DIAGNOSIS — M545 Low back pain, unspecified: Secondary | ICD-10-CM

## 2022-02-24 DIAGNOSIS — E7800 Pure hypercholesterolemia, unspecified: Secondary | ICD-10-CM | POA: Diagnosis not present

## 2022-02-24 DIAGNOSIS — R7302 Impaired glucose tolerance (oral): Secondary | ICD-10-CM

## 2022-02-24 MED ORDER — ESOMEPRAZOLE MAGNESIUM 20 MG PO CPDR: 20.0000 mg | DELAYED_RELEASE_CAPSULE | Freq: Every day | ORAL | 3 refills | Status: DC

## 2022-02-24 MED ORDER — DAPAGLIFLOZIN PROPANEDIOL 5 MG PO TABS: 5.0000 mg | ORAL_TABLET | Freq: Every day | ORAL | 3 refills | Status: DC

## 2022-02-24 MED ORDER — SOLIFENACIN SUCCINATE 5 MG PO TABS: 5.0000 mg | ORAL_TABLET | Freq: Every day | ORAL | 3 refills | Status: DC

## 2022-02-24 MED ORDER — AMLODIPINE BESYLATE 2.5 MG PO TABS: 2.5000 mg | ORAL_TABLET | Freq: Every day | ORAL | 3 refills | Status: DC

## 2022-02-24 NOTE — Progress Notes (Signed)
Patient ID: Henry Ellis, male   DOB: April 09, 1956, 66 y.o.   MRN: [id number]         Chief Complaint:: wellness exam and Follow-up  Ckd3, lbp, hyerglycemia, hld, htn       HPI:  Henry Ellis is a 66 y.o. male here for wellness exam; decliens covid booster, shingrix, but for pneumovax, o/w up to date                        Also Pt continues to have 1 wk LBP across the lower back, but no bowel or bladder change, fever, wt loss,  worsening LE pain/numbness/weakness, gait change or falls.  Started after liftiing rocks worsening on the yard.  Tyelnol helps.  Pt denies chest pain, increased sob or doe, wheezing, orthopnea, PND, increased LE swelling, palpitations, dizziness or syncope.   Pt denies polydipsia, polyuria, or new focal neuro s/s.    Pt denies fever, wt loss, night sweats, loss of appetite, or other constitutional symptoms  Also, Invoakana at $400 - asks for change.  Also Not using mychart - asks for deactivate for now.     Wt Readings from Last 3 Encounters:  02/24/22 (!) 305 lb (138.3 kg)  09/12/21 (!) 313 lb 12.8 oz (142.3 kg)  08/26/21 (!) 316 lb 6.4 oz (143.5 kg)   BP Readings from Last 3 Encounters:  02/24/22 134/78  09/12/21 132/70  08/26/21 138/80   Immunization History  Administered Date(s) Administered   Fluad Quad(high Dose 65+) 09/16/2020, 08/26/2021   Influenza Split 09/27/2012   Influenza Whole 09/07/2008, 12/04/2011   Influenza,inj,Quad PF,6+ Mos 10/03/2013, 10/09/2014, 10/19/2016, 01/25/2018   Influenza-Unspecified 08/30/2018   PFIZER(Purple Top)SARS-COV-2 Vaccination 01/09/2020, 01/30/2020, 01/25/2021   Pneumococcal Conjugate-13 08/01/2019   Pneumococcal Polysaccharide-23 03/05/2009, 10/19/2016, 02/24/2022   Td 03/05/2009   Tdap 08/01/2019   Zoster, Live 04/16/2015   There are no preventive care reminders to display for this patient.     Past Medical History:  Diagnosis Date   Allergy    seasonal allergies   Anemia    on meds   Anxiety    hx  of   Arthritis    generalized   Barrett's esophagus    Cancer ([place]) 10/17/2016   kidney cancer(removed left kidney in 2017)   Carpal tunnel syndrome, right 04/21/2011   Chronic bronchitis    Chronic kidney disease 2017   2017-had left kidney removed   ED (erectile dysfunction)    Esophageal reflux    on meds   Esophageal stricture    Essential hypertension 10/19/2015   on meds   H/O adenomatous polyp of colon 06/05/2012   Hiatal hernia    High cholesterol    on meds   History of Bell's palsy    IBS (irritable bowel syndrome)    Personal history of colonic polyps 04/15/2008 & 04/22/2012   TUBULAR ADENOMA   Ulcerative esophagitis    Past Surgical History:  Procedure Laterality Date   CARPAL TUNNEL RELEASE     left   INGUINAL HERNIA REPAIR     bilateral   KNEE ARTHROSCOPY     right [name]   ROBOT ASSISTED LAPAROSCOPIC NEPHRECTOMY Left 10/18/2016   Procedure: XI ROBOTIC ASSISTED LAPAROSCOPIC RADICAL NEPHRECTOMY;  Surgeon: Alexis Frock, MD;  Location: [facility];  Service: Urology;  Laterality: Left;   WISDOM TOOTH EXTRACTION      reports that he quit smoking about 5 years ago. His smoking use included cigarettes.  He smoked an average of 1 pack per day. He has never used smokeless tobacco. He reports current alcohol use of about 3.0 standard drinks per week. He reports that he does not currently use drugs after having used the following drugs: Marijuana. Frequency: 2.00 times per week. family history includes Heart disease in his father; Hypertension in his brother; Lung cancer in his father. Allergies  Allergen Reactions   Bee Venom Anaphylaxis   Aspirin Nausea Only   Amoxicillin Rash    Did PCN reaction causing immediate rash, facial/tongue/throat swelling, SOB or lightheadedness with hypotension: no Did PCN reaction causing severe rash involving mucus membranes or skin necrosis: no Did PCN reaction that required hospitalization : no Did pcn reaction occurring within the last  10 years: no If all of the above answers are "NO", then may proceed with Cephalosporin use. Pt states he can take low doses of amoxicillin, has taken for dental procedures without reaction   Other Rash    Aspirin cream    Current Outpatient Medications on File Prior to Visit  Medication Sig Dispense Refill   canagliflozin (INVOKANA) 100 MG TABS tablet Take 1 tablet (100 mg total) by mouth daily before breakfast. 90 tablet 3   Cholecalciferol (VITAMIN D3 PO) Take by mouth.     docusate sodium (COLACE) 100 MG capsule Take 100 mg by mouth daily.     ferrous sulfate 325 (65 FE) MG tablet Take 325 mg by mouth daily with breakfast.     finasteride (PROSCAR) 5 MG tablet Take 5 mg by mouth daily.     Psyllium 30.9 % POWD Take 1 packet by mouth daily.     atorvastatin (LIPITOR) 40 MG tablet Take 1 tablet (40 mg total) by mouth daily. 90 tablet 3   No current facility-administered medications on file prior to visit.        ROS:  All others reviewed and negative.  Objective        PE:  BP 134/78 (BP Location: Right Arm, Patient Position: Sitting, Cuff Size: Large)    Pulse (!) 53    Temp 98.4 F (36.9 C) (Oral)    Ht '6\' 1"'$  (1.854 m)    Wt (!) 305 lb (138.3 kg)    SpO2 100%    BMI 40.24 kg/m                 Constitutional: Pt appears in NAD               HENT: Head: NCAT.                Right Ear: External ear normal.                 Left Ear: External ear normal.                Eyes: . Pupils are equal, round, and reactive to light. Conjunctivae and EOM are normal               Nose: without d/c or deformity               Neck: Neck supple. Gross normal ROM               Cardiovascular: Normal rate and regular rhythm.                 Pulmonary/Chest: Effort normal and breath sounds without rales or wheezing.  Abd:  Soft, NT, ND, + BS, no organomegaly               Neurological: Pt is alert. At baseline orientation, motor grossly intact               Skin: Skin is warm. No  rashes, no other new lesions, LE edema - none               Psychiatric: Pt behavior is normal without agitation   Micro: none  Cardiac tracings I have personally interpreted today:  none  Pertinent Radiological findings (summarize): none   Lab Results  Component Value Date   WBC 5.6 02/20/2022   HGB 13.4 02/20/2022   HCT 40.3 02/20/2022   PLT 216.0 02/20/2022   GLUCOSE 88 02/20/2022   CHOL 127 02/20/2022   TRIG 123.0 02/20/2022   HDL 43.60 02/20/2022   LDLDIRECT 151.7 03/25/2010   LDLCALC 59 02/20/2022   ALT 9 02/20/2022   AST 11 02/20/2022   NA 138 02/20/2022   K 4.8 02/20/2022   CL 101 02/20/2022   CREATININE 1.54 (H) 02/20/2022   BUN 30 (H) 02/20/2022   CO2 28 02/20/2022   TSH 0.83 02/20/2022   PSA 1.57 02/20/2022   HGBA1C 6.1 02/20/2022   MICROALBUR 2.4 (H) 02/13/2020   Assessment/Plan:  Ketih Goodie is a 66 y.o. White or Caucasian [1] male with  has a past medical history of Allergy, Anemia, Anxiety, Arthritis, Barrett's esophagus, Cancer ([place]) (10/17/2016), Carpal tunnel syndrome, right (04/21/2011), Chronic bronchitis, Chronic kidney disease (2017), ED (erectile dysfunction), Esophageal reflux, Esophageal stricture, Essential hypertension (10/19/2015), H/O adenomatous polyp of colon (06/05/2012), Hiatal hernia, High cholesterol, History of Bell's palsy, IBS (irritable bowel syndrome), Personal history of colonic polyps (04/15/2008 & 04/22/2012), and Ulcerative esophagitis.  Vitamin D deficiency Last vitamin D Lab Results  Component Value Date   VD25OH 49.84 02/20/2022   Stable, cont oral replacement   Encounter for well adult exam with abnormal findings Age and sex appropriate education and counseling updated with regular exercise and diet Referrals for preventative services - none needed Immunizations addressed - declines covid booster, shingrix but for pneumovax today Smoking counseling  - none needed Evidence for depression or other mood disorder - none  significant Most recent labs reviewed. I have personally reviewed and have noted: 1) the patient's medical and social history 2) The patient's current medications and supplements 3) The patient's height, weight, and BMI have been recorded in the chart   Impaired glucose tolerance Lab Results  Component Value Date   HGBA1C 6.1 02/20/2022   Stable, pt to continue current medical treatment  - diet   HYPERCHOLESTEROLEMIA Lab Results  Component Value Date   LDLCALC 59 02/20/2022   Stable, pt to continue current statin lipitor 40   Essential hypertension BP Readings from Last 3 Encounters:  02/24/22 134/78  09/12/21 132/70  08/26/21 138/80   Stable, pt to continue medical treatment norvasc   CKD (chronic kidney disease) Lab Results  Component Value Date   CREATININE 1.54 (H) 02/20/2022   Stable overall, cont to avoid nephrotoxins   Low back pain Mild, c/w msk strain and/or underlying lumbar DDD/djd  Followup: No follow-ups on file.  Cathlean Cower, MD 02/25/2022 6:49 PM [facility]

## 2022-02-24 NOTE — Patient Instructions (Addendum)
OK to change the Invokana to Farixga 5 mg - 1 per day ? ?Remember to ask about Jardiance or back to the Invokana if needed due to cost ? ?You had the Pneumovax pneumonia shot today ? ?Please continue all other medications as before, and refills have been done if requested. ? ?Please have the pharmacy call with any other refills you may need. ? ?Please continue your efforts at being more active, low cholesterol diet, and weight control. ? ?You are otherwise up to date with prevention measures today. ? ?Please keep your appointments with your specialists as you may have planned ? ?Please make an Appointment to return in 6 months, or sooner if needed, also with Lab Appointment for testing done 3-5 days before at the [place] (so this is for TWO appointments - please see the scheduling desk as you leave) ? ?Due to the ongoing Covid 19 pandemic, our lab now requires an appointment for any labs done at our office.  If you need labs done and do not have an appointment, please call our office ahead of time to schedule before presenting to the lab for your testing. ? ? ? ? ? ? ? ?

## 2022-02-24 NOTE — Assessment & Plan Note (Signed)
Last vitamin D ?Lab Results  ?Component Value Date  ? VD25OH 49.84 02/20/2022  ? ?Stable, cont oral replacement ? ?

## 2022-02-25 DIAGNOSIS — M545 Low back pain, unspecified: Secondary | ICD-10-CM | POA: Insufficient documentation

## 2022-02-25 NOTE — Assessment & Plan Note (Signed)
BP Readings from Last 3 Encounters:  ?02/24/22 134/78  ?09/12/21 132/70  ?08/26/21 138/80  ? ?Stable, pt to continue medical treatment norvasc ? ?

## 2022-02-25 NOTE — Assessment & Plan Note (Signed)
Mild, c/w msk strain and/or underlying lumbar DDD/djd ?

## 2022-02-25 NOTE — Assessment & Plan Note (Signed)
Lab Results  ?Component Value Date  ? HGBA1C 6.1 02/20/2022  ? ?Stable, pt to continue current medical treatment  - diet ? ?

## 2022-02-25 NOTE — Assessment & Plan Note (Signed)
Lab Results  ?Component Value Date  ? LDLCALC 59 02/20/2022  ? ?Stable, pt to continue current statin lipitor 40 ? ?

## 2022-02-25 NOTE — Assessment & Plan Note (Addendum)
Age and sex appropriate education and counseling updated with regular exercise and diet ?Referrals for preventative services - none needed ?Immunizations addressed - declines covid booster, shingrix but for pneumovax today ?Smoking counseling  - none needed ?Evidence for depression or other mood disorder - none significant ?Most recent labs reviewed. ?I have personally reviewed and have noted: ?1) the patient's medical and social history ?2) The patient's current medications and supplements ?3) The patient's height, weight, and BMI have been recorded in the chart ? ?

## 2022-02-25 NOTE — Assessment & Plan Note (Signed)
Lab Results  ?Component Value Date  ? CREATININE 1.54 (H) 02/20/2022  ? ?Stable overall, cont to avoid nephrotoxins ? ?

## 2022-03-15 NOTE — Progress Notes (Signed)
?Cardiology Office Note:   ? ?Date:  03/16/2022  ? ?ID:  Henry Ellis, DOB Jan 01, 1956, MRN [id number] ? ?PCP:  Henry Borg, MD ?  ?[place]  ?Cardiologist:  Werner Lean, MD  ?Advanced Practice Provider:  No care team member to display ?Electrophysiologist:  None  ?    ?CC: Follow up leg swelling  ? ?History of Present Illness:   ? ?Henry Ellis is a 66 y.o. male with a hx of HTN, Aortic Atherosclerosis HLD, Distant smoking in 2017; morbid obesiity who presents fore evaluation 03/11/21. In interim of this visit, patient had normal echo with stable leg swelling.  Seen 09/12/21.   ?Since retirement since doing well. ? ?Patient notes that he is doing well.   ?Walks and does things in the year.   ?There are no interval hospital/ED visit.   ? ?No chest pain or pressure .  No SOB/DOE and no PND/Orthopnea.  No weight gain or leg swelling.  No palpitations or syncope. ? ? ?Past Medical History:  ?Diagnosis Date  ? Allergy   ? seasonal allergies  ? Anemia   ? on meds  ? Anxiety   ? hx of  ? Arthritis   ? generalized  ? Barrett's esophagus   ? Cancer ([place]) 10/17/2016  ? kidney cancer(removed left kidney in 2017)  ? Carpal tunnel syndrome, right 04/21/2011  ? Chronic bronchitis   ? Chronic kidney disease 2017  ? 2017-had left kidney removed  ? ED (erectile dysfunction)   ? Esophageal reflux   ? on meds  ? Esophageal stricture   ? Essential hypertension 10/19/2015  ? on meds  ? H/O adenomatous polyp of colon 06/05/2012  ? Hiatal hernia   ? High cholesterol   ? on meds  ? History of Bell's palsy   ? IBS (irritable bowel syndrome)   ? Personal history of colonic polyps 04/15/2008 & 04/22/2012  ? TUBULAR ADENOMA  ? Ulcerative esophagitis   ? ? ?Past Surgical History:  ?Procedure Laterality Date  ? CARPAL TUNNEL RELEASE    ? left  ? INGUINAL HERNIA REPAIR    ? bilateral  ? KNEE ARTHROSCOPY    ? right [name]  ? ROBOT ASSISTED LAPAROSCOPIC NEPHRECTOMY Left 10/18/2016  ? Procedure: XI ROBOTIC  ASSISTED LAPAROSCOPIC RADICAL NEPHRECTOMY;  Surgeon: Alexis Frock, MD;  Location: [facility];  Service: Urology;  Laterality: Left;  ? WISDOM TOOTH EXTRACTION    ? ? ?Current Medications: ?Current Meds  ?Medication Sig  ? amLODipine (NORVASC) 2.5 MG tablet Take 1 tablet (2.5 mg total) by mouth daily.  ? apixaban (ELIQUIS) 5 MG TABS tablet Take 1 tablet (5 mg total) by mouth 2 (two) times daily.  ? atorvastatin (LIPITOR) 40 MG tablet Take 1 tablet (40 mg total) by mouth daily.  ? canagliflozin (INVOKANA) 100 MG TABS tablet Take 1 tablet (100 mg total) by mouth daily before breakfast.  ? Cholecalciferol (VITAMIN D3 PO) Take by mouth.  ? dapagliflozin propanediol (FARXIGA) 5 MG TABS tablet Take 1 tablet (5 mg total) by mouth daily before breakfast.  ? docusate sodium (COLACE) 100 MG capsule Take 100 mg by mouth daily.  ? esomeprazole (NEXIUM) 20 MG capsule Take 1 capsule (20 mg total) by mouth daily at 12 noon.  ? ferrous sulfate 325 (65 FE) MG tablet Take 325 mg by mouth daily with breakfast.  ? finasteride (PROSCAR) 5 MG tablet Take 5 mg by mouth daily.  ? Psyllium 30.9 %  POWD Take 1 packet by mouth daily.  ? solifenacin (VESICARE) 5 MG tablet Take 1 tablet (5 mg total) by mouth daily.  ?  ? ?Allergies:   Bee venom, Aspirin, Amoxicillin, and Other  ? ?Social History  ? ?Socioeconomic History  ? Marital status: Single  ?  Spouse name: Not on file  ? Number of children: Not on file  ? Years of education: Not on file  ? Highest education level: Not on file  ?Occupational History  ? Occupation: Dealer  ?  Employer: [employer]  ?Tobacco Use  ? Smoking status: Former  ?  Packs/day: 1.00  ?  Types: Cigarettes  ?  Quit date: 10/17/2016  ?  Years since quitting: 5.4  ? Smokeless tobacco: Never  ? Tobacco comments:  ?  Counseling sheet given 03-2012   ?Vaping Use  ? Vaping Use: Former  ?Substance and Sexual Activity  ? Alcohol use: Yes  ?  Alcohol/week: 3.0 standard drinks  ?  Types: 3 Shots of liquor per week  ?  Drug use: Not Currently  ?  Frequency: 2.0 times per week  ?  Types: Marijuana  ?  Comment: last use June 05, 2016  ? Sexual activity: Not on file  ?Other Topics Concern  ? Not on file  ?Social History Narrative  ? Not on file  ? ?Social Determinants of Health  ? ?Financial Resource Strain: Low Risk   ? Difficulty of Paying Living Expenses: Not hard at all  ?Food Insecurity: No Food Insecurity  ? Worried About Charity fundraiser in the Last Year: Never true  ? Ran Out of Food in the Last Year: Never true  ?Transportation Needs: No Transportation Needs  ? Lack of Transportation (Medical): No  ? Lack of Transportation (Non-Medical): No  ?Physical Activity: Sufficiently Active  ? Days of Exercise per Week: 5 days  ? Minutes of Exercise per Session: 30 min  ?Stress: No Stress Concern Present  ? Feeling of Stress : Not at all  ?Social Connections: Socially Isolated  ? Frequency of Communication with Friends and Family: Three times a week  ? Frequency of Social Gatherings with Friends and Family: Once a week  ? Attends Religious Services: Never  ? Active Member of Clubs or Organizations: No  ? Attends Archivist Meetings: Never  ? Marital Status: Never married  ?  ?Social:  Retired; goes to [employer] with his nieces and nephews every summer ? ?Family History: ?The patient's family history includes Heart disease in his father; Hypertension in his brother; Lung cancer in his father. There is no history of Colon cancer, Stomach cancer, Esophageal cancer, Rectal cancer, or Colon polyps.  ?History of coronary artery disease notable for father and oldest brother. ?History of heart failure notable for no members. ?History of arrhythmia notable for sister has pacemaker. ? ?ROS:   ?Please see the history of present illness.    ? All other systems reviewed and are negative. ? ?EKGs/Labs/Other Studies Reviewed:   ? ?The following studies were reviewed today: ? ?EKG:   ?03/16/22:  AFL rare PVC ?02/18/21: SR 1st HB rate 82   ? ?Transthoracic Echocardiogram: ?Date:04/12/21 ?Results: Borderline AA (WNL for age and BSA) normal LV fx ? 1. Left ventricular ejection fraction, by estimation, is 60 to 65%. The  ?left ventricle has normal function. The left ventricle has no regional  ?wall motion abnormalities. There is mild concentric left ventricular  ?hypertrophy. Left ventricular diastolic  ?parameters are  indeterminate.  ? 2. Right ventricular systolic function is normal. The right ventricular  ?size is normal. Tricuspid regurgitation signal is inadequate for assessing  ?PA pressure.  ? 3. The pericardial effusion is circumferential.  ? 4. The mitral valve is normal in structure. No evidence of mitral valve  ?regurgitation. No evidence of mitral stenosis.  ? 5. The aortic valve is normal in structure. Aortic valve regurgitation is  ?not visualized. No aortic stenosis is present.  ? 6. Aortic dilatation noted. There is mild dilatation of the aortic root  ?and of the ascending aorta, measuring 38 mm.  ? 7. The inferior vena cava is normal in size with greater than 50%  ?respiratory variability, suggesting right atrial pressure of 3 mmHg.  ? ?NonCardiac CT : ?Date: 10/01/20 ?Results: ?Aortic Atherosclerosis without CAC ? ?NM Stress Testing: ?Date: 04/05/2012 ?Results:  Negative per report ? ?Recent Labs: ?02/20/2022: ALT 9; BUN 30; Creatinine, Ser 1.54; Hemoglobin 13.4; Platelets 216.0; Potassium 4.8; Sodium 138; TSH 0.83  ?Recent Lipid Panel ?   ?Component Value Date/Time  ? CHOL 127 02/20/2022 0745  ? TRIG 123.0 02/20/2022 0745  ? HDL 43.60 02/20/2022 0745  ? CHOLHDL 3 02/20/2022 0745  ? VLDL 24.6 02/20/2022 0745  ? [place] 59 02/20/2022 0745  ? LDLDIRECT 151.7 03/25/2010 1108  ? ? ?Physical Exam:   ? ?VS:  BP 119/80   Pulse 71   Ht '6\' 1"'$  (1.854 m)   Wt (!) 308 lb (139.7 kg)   SpO2 99%   BMI 40.64 kg/m?    ? ?Wt Readings from Last 3 Encounters:  ?03/16/22 (!) 308 lb (139.7 kg)  ?02/24/22 (!) 305 lb (138.3 kg)  ?09/12/21 (!) 313 lb 12.8 oz  (142.3 kg)  ?  ? ?Gen: no distress, Morbid obesity   ?Neck: No JVD, thick neck ?Cardiac: No Rubs or Gallops, no Murmur, RRR distant heart sounds  ?Respiratory: Clear to auscultation bilaterally, normal effort,

## 2022-03-16 ENCOUNTER — Other Ambulatory Visit: Payer: Medicare Other

## 2022-03-16 ENCOUNTER — Ambulatory Visit: Payer: Medicare Other | Admitting: Internal Medicine

## 2022-03-16 ENCOUNTER — Encounter: Payer: Self-pay | Admitting: Internal Medicine

## 2022-03-16 VITALS — BP 119/80 | HR 71 | Ht 73.0 in | Wt 308.0 lb

## 2022-03-16 DIAGNOSIS — I10 Essential (primary) hypertension: Secondary | ICD-10-CM | POA: Diagnosis not present

## 2022-03-16 DIAGNOSIS — E7800 Pure hypercholesterolemia, unspecified: Secondary | ICD-10-CM | POA: Diagnosis not present

## 2022-03-16 DIAGNOSIS — I4892 Unspecified atrial flutter: Secondary | ICD-10-CM | POA: Diagnosis not present

## 2022-03-16 DIAGNOSIS — I700 Atherosclerosis of aorta: Secondary | ICD-10-CM | POA: Diagnosis not present

## 2022-03-16 MED ORDER — APIXABAN 5 MG PO TABS: 5.0000 mg | ORAL_TABLET | Freq: Two times a day (BID) | ORAL | 3 refills | Status: DC

## 2022-03-16 NOTE — Patient Instructions (Addendum)
Medication Instructions:  ?Your physician has recommended you make the following change in your medication:  ?START: apixaban (Eliquis) 5 mg by mouth twice daily ?We have provided you with Eliquis samples, a 30 day free trial card, and a $10 copay card to use if eligible.   ? ?*If you need a refill on your cardiac medications before your next appointment, please call your pharmacy* ? ? ?Lab Work: ?IN 2 WEEKS: CBC ? ?If you have labs (blood work) drawn today and your tests are completely normal, you will receive your results only by: ?MyChart Message (if you have MyChart) OR ?A paper copy in the mail ?If you have any lab test that is abnormal or we need to change your treatment, we will call you to review the results. ? ? ?Testing/Procedures: ?Your physician has requested that you have an echocardiogram. Echocardiography is a painless test that uses sound waves to create images of your heart. It provides your doctor with information about the size and shape of your heart and how well your heart?s chambers and valves are working. This procedure takes approximately one hour. There are no restrictions for this procedure. ? ? ? ?Follow-Up: ?At [facility], you and your health needs are our priority.  As part of our continuing mission to provide you with exceptional heart care, we have created designated Provider Care Teams.  These Care Teams include your primary Cardiologist (physician) and Advanced Practice Providers (APPs -  Physician Assistants and Nurse Practitioners) who all work together to provide you with the care you need, when you need it. ? ?We recommend signing up for the patient portal called "MyChart".  Sign up information is provided on this After Visit Summary.  MyChart is used to connect with patients for Virtual Visits (Telemedicine).  Patients are able to view lab/test results, encounter notes, upcoming appointments, etc.  Non-urgent messages can be sent to your provider as well.   ?To learn more  about what you can do with MyChart, go to [email or web address].   ? ?Your next appointment:   ?6 month(s) ? ?The format for your next appointment:   ?In Person ? ?Provider:   ?Werner Lean, MD   ? ?

## 2022-03-28 ENCOUNTER — Telehealth: Payer: Self-pay | Admitting: Internal Medicine

## 2022-03-28 MED ORDER — RIVAROXABAN 20 MG PO TABS: 20.0000 mg | ORAL_TABLET | Freq: Every day | ORAL | 3 refills | Status: DC

## 2022-03-28 NOTE — Telephone Encounter (Signed)
Called pt advised that eliquis is now listed as a medication allergy.  Advised of MD recommendation to start Xarelto 20 mg PO QD with supper.  Reviewed bleeding precautions. Informed pt that 30 days of samples, a 30 day free trial card and co pay card will be left at the front desk for pt to pick up.  Pt had no further questions or concerns.   ?

## 2022-03-28 NOTE — Telephone Encounter (Signed)
Pt c/o medication issue: ? ?1. Name of Medication: Elliquis ? ?2. How are you currently taking this medication (dosage and times per day)? 2 times a day- have not taken it since Saturday(03-25-22) ? ?3. Are you having a reaction (difficulty breathing--STAT)? no ? ?4. What is your medication issue? Rash around his navel and itching a lot ? ?

## 2022-03-28 NOTE — Telephone Encounter (Signed)
Pt reports starting taking Eliquis on 03/17/22.  Stopped taking medication on 02/22/22 AM d/t itching/ rash to navel. First noticed this on Thursday 03/23/22.  Reports it looks like someone rubbed poison ivy on navel.  Has applied anti itch cream and nothing helped.  Pt started on Eliquis 5 mg PO BID on 03/16/22 for new onset A-flutter CHADSVAC score of 2.  Will send to MD to review.  ?

## 2022-03-31 ENCOUNTER — Ambulatory Visit (HOSPITAL_COMMUNITY): Payer: Medicare Other | Attending: Internal Medicine

## 2022-03-31 ENCOUNTER — Other Ambulatory Visit: Payer: Medicare Other | Admitting: *Deleted

## 2022-03-31 DIAGNOSIS — I4892 Unspecified atrial flutter: Secondary | ICD-10-CM

## 2022-03-31 LAB — CBC
Hematocrit: 38.9 % (ref 37.5–51.0)
Hemoglobin: 12.8 g/dL — ABNORMAL LOW (ref 13.0–17.7)
MCH: 30.8 pg (ref 26.6–33.0)
MCHC: 32.9 g/dL (ref 31.5–35.7)
MCV: 94 fL (ref 79–97)
Platelets: 194 10*3/uL (ref 150–450)
RBC: 4.15 x10E6/uL (ref 4.14–5.80)
RDW: 13.9 % (ref 11.6–15.4)
WBC: 5.2 10*3/uL (ref 3.4–10.8)

## 2022-03-31 LAB — ECHOCARDIOGRAM COMPLETE
Area-P 1/2: 3.48 cm2
S' Lateral: 3.5 cm

## 2022-03-31 MED ORDER — PERFLUTREN LIPID MICROSPHERE: 1.0000 mL | INTRAVENOUS | Status: AC | PRN

## 2022-03-31 MED ORDER — PERFLUTREN LIPID MICROSPHERE: 1.0000 mL | INTRAVENOUS | Status: DC | PRN

## 2022-03-31 MED ORDER — PERFLUTREN LIPID MICROSPHERE
1.0000 mL | INTRAVENOUS | Status: AC | PRN
  Administered 2022-03-31: 3 mL via INTRAVENOUS
  Administered 2022-03-31: 2 mL via INTRAVENOUS
  Administered 2022-03-31: 3 mL via INTRAVENOUS
  Administered 2022-03-31: 2 mL via INTRAVENOUS
  Administered 2022-03-31: 3 mL via INTRAVENOUS

## 2022-04-03 MED ORDER — PERFLUTREN LIPID MICROSPHERE: 1.0000 mL | INTRAVENOUS | Status: AC | PRN

## 2022-04-03 MED ORDER — PERFLUTREN LIPID MICROSPHERE
1.0000 mL | INTRAVENOUS | Status: AC | PRN
  Administered 2022-03-31: 3 mL via INTRAVENOUS
  Administered 2022-03-31: 2 mL via INTRAVENOUS
  Administered 2022-03-31: 3 mL via INTRAVENOUS
  Administered 2022-03-31: 2 mL via INTRAVENOUS

## 2022-04-03 MED ORDER — PERFLUTREN LIPID MICROSPHERE
1.0000 mL | INTRAVENOUS | Status: AC | PRN
  Administered 2022-03-31: 3 mL via INTRAVENOUS

## 2022-04-13 DIAGNOSIS — H524 Presbyopia: Secondary | ICD-10-CM | POA: Diagnosis not present

## 2022-04-20 DIAGNOSIS — B351 Tinea unguium: Secondary | ICD-10-CM | POA: Diagnosis not present

## 2022-04-20 DIAGNOSIS — M79674 Pain in right toe(s): Secondary | ICD-10-CM | POA: Diagnosis not present

## 2022-04-20 DIAGNOSIS — M79675 Pain in left toe(s): Secondary | ICD-10-CM | POA: Diagnosis not present

## 2022-04-20 DIAGNOSIS — L600 Ingrowing nail: Secondary | ICD-10-CM | POA: Diagnosis not present

## 2022-05-17 ENCOUNTER — Encounter: Payer: Self-pay | Admitting: Internal Medicine

## 2022-05-17 ENCOUNTER — Ambulatory Visit (INDEPENDENT_AMBULATORY_CARE_PROVIDER_SITE_OTHER): Payer: Medicare Other | Admitting: Internal Medicine

## 2022-05-17 VITALS — BP 118/70 | HR 45 | Temp 97.8°F | Ht 73.0 in | Wt 301.0 lb

## 2022-05-17 DIAGNOSIS — S30861A Insect bite (nonvenomous) of abdominal wall, initial encounter: Secondary | ICD-10-CM

## 2022-05-17 DIAGNOSIS — R7302 Impaired glucose tolerance (oral): Secondary | ICD-10-CM | POA: Diagnosis not present

## 2022-05-17 DIAGNOSIS — R001 Bradycardia, unspecified: Secondary | ICD-10-CM | POA: Diagnosis not present

## 2022-05-17 DIAGNOSIS — W57XXXA Bitten or stung by nonvenomous insect and other nonvenomous arthropods, initial encounter: Secondary | ICD-10-CM | POA: Diagnosis not present

## 2022-05-17 DIAGNOSIS — N1831 Chronic kidney disease, stage 3a: Secondary | ICD-10-CM

## 2022-05-17 MED ORDER — DOXYCYCLINE HYCLATE 100 MG PO TABS: 100.0000 mg | ORAL_TABLET | Freq: Two times a day (BID) | ORAL | 0 refills | Status: DC

## 2022-05-17 NOTE — Progress Notes (Unsigned)
Patient ID: Henry Ellis, male   DOB: Oct 19, 1956, 66 y.o.   MRN: [id number]        Chief Complaint: follow up rash to RLQ with tick bite, bradycardia, and hyperglycemia       HPI:  Henry Ellis is a 66 y.o. male here with c/o 3 days onset finding of tick probably less than 24 hrs after walking in woods, but now with marked erythema and mild tender surrounding the site, but no fever, chills, red steaks or drainage.  Pt denies chest pain, increased sob or doe, wheezing, orthopnea, PND, increased LE swelling, palpitations, dizziness or syncope.   Pt denies polydipsia, polyuria, or new focal neuro s/s.    Pt denies fever, wt loss, night sweats, loss of appetite, or other constitutional symptoms  Does have low HR today, but this is chronic occasional per pt.  Still finishing invoakana and plans to change to farxiga soon.  Has recent hx of atrial flutter       Wt Readings from Last 3 Encounters:  05/17/22 (!) 301 lb (136.5 kg)  03/16/22 (!) 308 lb (139.7 kg)  02/24/22 (!) 305 lb (138.3 kg)   BP Readings from Last 3 Encounters:  05/17/22 118/70  03/16/22 119/80  02/24/22 134/78         Past Medical History:  Diagnosis Date   Allergy    seasonal allergies   Anemia    on meds   Anxiety    hx of   Arthritis    generalized   Barrett's esophagus    Cancer ([place]) 10/17/2016   kidney cancer(removed left kidney in 2017)   Carpal tunnel syndrome, right 04/21/2011   Chronic bronchitis    Chronic kidney disease 2017   2017-had left kidney removed   ED (erectile dysfunction)    Esophageal reflux    on meds   Esophageal stricture    Essential hypertension 10/19/2015   on meds   H/O adenomatous polyp of colon 06/05/2012   Hiatal hernia    High cholesterol    on meds   History of Bell's palsy    IBS (irritable bowel syndrome)    Personal history of colonic polyps 04/15/2008 & 04/22/2012   TUBULAR ADENOMA   Ulcerative esophagitis    Past Surgical History:  Procedure Laterality Date    CARPAL TUNNEL RELEASE     left   INGUINAL HERNIA REPAIR     bilateral   KNEE ARTHROSCOPY     right [name]   ROBOT ASSISTED LAPAROSCOPIC NEPHRECTOMY Left 10/18/2016   Procedure: XI ROBOTIC ASSISTED LAPAROSCOPIC RADICAL NEPHRECTOMY;  Surgeon: Alexis Frock, MD;  Location: [facility];  Service: Urology;  Laterality: Left;   WISDOM TOOTH EXTRACTION      reports that he quit smoking about 5 years ago. His smoking use included cigarettes. He smoked an average of 1 pack per day. He has never used smokeless tobacco. He reports current alcohol use of about 3.0 standard drinks per week. He reports that he does not currently use drugs after having used the following drugs: Marijuana. Frequency: 2.00 times per week. family history includes Heart disease in his father; Hypertension in his brother; Lung cancer in his father. Allergies  Allergen Reactions   Bee Venom Anaphylaxis   Aspirin Nausea Only   Amoxicillin Rash    Did PCN reaction causing immediate rash, facial/tongue/throat swelling, SOB or lightheadedness with hypotension: no Did PCN reaction causing severe rash involving mucus membranes or skin necrosis: no Did PCN  reaction that required hospitalization : no Did pcn reaction occurring within the last 10 years: no If all of the above answers are "NO", then may proceed with Cephalosporin use. Pt states he can take low doses of amoxicillin, has taken for dental procedures without reaction   Apixaban Itching   Other Rash    Aspirin cream    Current Outpatient Medications on File Prior to Visit  Medication Sig Dispense Refill   amLODipine (NORVASC) 2.5 MG tablet Take 1 tablet (2.5 mg total) by mouth daily. 90 tablet 3   canagliflozin (INVOKANA) 100 MG TABS tablet Take 1 tablet (100 mg total) by mouth daily before breakfast. 90 tablet 3   Cholecalciferol (VITAMIN D3 PO) Take by mouth.     dapagliflozin propanediol (FARXIGA) 5 MG TABS tablet Take 1 tablet (5 mg total) by mouth daily before  breakfast. 90 tablet 3   docusate sodium (COLACE) 100 MG capsule Take 100 mg by mouth daily.     esomeprazole (NEXIUM) 20 MG capsule Take 1 capsule (20 mg total) by mouth daily at 12 noon. 90 capsule 3   ferrous sulfate 325 (65 FE) MG tablet Take 325 mg by mouth daily with breakfast.     finasteride (PROSCAR) 5 MG tablet Take 5 mg by mouth daily.     Psyllium 30.9 % POWD Take 1 packet by mouth daily.     rivaroxaban (XARELTO) 20 MG TABS tablet Take 1 tablet (20 mg total) by mouth daily with supper. 90 tablet 3   solifenacin (VESICARE) 5 MG tablet Take 1 tablet (5 mg total) by mouth daily. 90 tablet 3   atorvastatin (LIPITOR) 40 MG tablet Take 1 tablet (40 mg total) by mouth daily. 90 tablet 3   No current facility-administered medications on file prior to visit.        ROS:  All others reviewed and negative.  Objective        PE:  BP 118/70 (BP Location: Left Arm, Patient Position: Sitting, Cuff Size: Large)   Pulse (!) 45   Temp 97.8 F (36.6 C) (Oral)   Ht '6\' 1"'$  (1.854 m)   Wt (!) 301 lb (136.5 kg)   SpO2 98%   BMI 39.71 kg/m                 Constitutional: Pt appears in NAD               HENT: Head: NCAT.                Right Ear: External ear normal.                 Left Ear: External ear normal.                Eyes: . Pupils are equal, round, and reactive to light. Conjunctivae and EOM are normal               Nose: without d/c or deformity               Neck: Neck supple. Gross normal ROM               Cardiovascular: Normal rate and regular rhythm.                 Pulmonary/Chest: Effort normal and breath sounds without rales or wheezing.                Abd:  Soft, NT, ND, + BS, no organomegaly  Neurological: Pt is alert. At baseline orientation, motor grossly intact               Skin:  LE edema - none, RLQ abd with 4 cm area central tick site with surrounding mild erythema without central clearing, no red streaks or drainage               Psychiatric: Pt  behavior is normal without agitation   Micro: none  Cardiac tracings I have personally interpreted today:  none  Pertinent Radiological findings (summarize): none   Lab Results  Component Value Date   WBC 5.2 03/31/2022   HGB 12.8 (L) 03/31/2022   HCT 38.9 03/31/2022   PLT 194 03/31/2022   GLUCOSE 88 02/20/2022   CHOL 127 02/20/2022   TRIG 123.0 02/20/2022   HDL 43.60 02/20/2022   LDLDIRECT 151.7 03/25/2010   LDLCALC 59 02/20/2022   ALT 9 02/20/2022   AST 11 02/20/2022   NA 138 02/20/2022   K 4.8 02/20/2022   CL 101 02/20/2022   CREATININE 1.54 (H) 02/20/2022   BUN 30 (H) 02/20/2022   CO2 28 02/20/2022   TSH 0.83 02/20/2022   PSA 1.57 02/20/2022   HGBA1C 6.1 02/20/2022   MICROALBUR 2.4 (H) 02/13/2020   Assessment/Plan:  Henry Ellis is a 66 y.o. White or Caucasian [1] male with  has a past medical history of Allergy, Anemia, Anxiety, Arthritis, Barrett's esophagus, Cancer ([place]) (10/17/2016), Carpal tunnel syndrome, right (04/21/2011), Chronic bronchitis, Chronic kidney disease (2017), ED (erectile dysfunction), Esophageal reflux, Esophageal stricture, Essential hypertension (10/19/2015), H/O adenomatous polyp of colon (06/05/2012), Hiatal hernia, High cholesterol, History of Bell's palsy, IBS (irritable bowel syndrome), Personal history of colonic polyps (04/15/2008 & 04/22/2012), and Ulcerative esophagitis.  Bradycardia Regular by exam, asympt per pt, declines ECG or card monitor, plans to f/u cardiology soon  Impaired glucose tolerance Lab Results  Component Value Date   HGBA1C 6.1 02/20/2022   Stable, pt to continue current medical treatment - finish invokana and start farxiga 5 mg soon     Infected tick bite Mild to mod, for antibx course - doxycycline,  to f/u any worsening symptoms or concerns  CKD (chronic kidney disease) Lab Results  Component Value Date   CREATININE 1.54 (H) 02/20/2022   Stable overall, cont to avoid nephrotoxins   Followup: Return  if symptoms worsen or fail to improve.  Cathlean Cower, MD 05/20/2022 7:52 PM [facility]

## 2022-05-17 NOTE — Patient Instructions (Signed)
Please take all new medication as prescribed - the antibiotic  Please continue all other medications as before, and refills have been done if requested.  Please have the pharmacy call with any other refills you may need.  Please continue your efforts at being more active, low cholesterol diet, and weight control.  Please keep your appointments with your specialists as you may have planned    

## 2022-05-20 ENCOUNTER — Encounter: Payer: Self-pay | Admitting: Internal Medicine

## 2022-05-20 DIAGNOSIS — R001 Bradycardia, unspecified: Secondary | ICD-10-CM | POA: Insufficient documentation

## 2022-05-20 DIAGNOSIS — W57XXXA Bitten or stung by nonvenomous insect and other nonvenomous arthropods, initial encounter: Secondary | ICD-10-CM | POA: Insufficient documentation

## 2022-05-20 NOTE — Assessment & Plan Note (Signed)
Mild to mod, for antibx course - doxycycline,  to f/u any worsening symptoms or concerns 

## 2022-05-20 NOTE — Assessment & Plan Note (Addendum)
Lab Results  Component Value Date   HGBA1C 6.1 02/20/2022   Stable, pt to continue current medical treatment - finish invokana and start farxiga 5 mg soon

## 2022-05-20 NOTE — Assessment & Plan Note (Signed)
Regular by exam, asympt per pt, declines ECG or card monitor, plans to f/u cardiology soon

## 2022-05-20 NOTE — Assessment & Plan Note (Signed)
Lab Results  Component Value Date   CREATININE 1.54 (H) 02/20/2022   Stable overall, cont to avoid nephrotoxins

## 2022-07-07 ENCOUNTER — Ambulatory Visit (INDEPENDENT_AMBULATORY_CARE_PROVIDER_SITE_OTHER): Payer: Medicare Other

## 2022-07-07 DIAGNOSIS — Z0000 Encounter for general adult medical examination without abnormal findings: Secondary | ICD-10-CM

## 2022-07-07 NOTE — Patient Instructions (Signed)
Mr. Bognar , Thank you for taking time to come for your Medicare Wellness Visit. I appreciate your ongoing commitment to your health goals. Please review the following plan we discussed and let me know if I can assist you in the future.   Screening recommendations/referrals: Colonoscopy: 09/23/2020 Recommended yearly ophthalmology/optometry visit for glaucoma screening and checkup Recommended yearly dental visit for hygiene and checkup  Vaccinations: Influenza vaccine: completed  Pneumococcal vaccine: completed  Tdap vaccine: 08/01/2019 Shingles vaccine: will consider     Advanced directives: yes   Conditions/risks identified: none   Next appointment: none   Preventive Care 68 Years and Older, Male Preventive care refers to lifestyle choices and visits with your health care provider that can promote health and wellness. What does preventive care include? A yearly physical exam. This is also called an annual well check. Dental exams once or twice a year. Routine eye exams. Ask your health care provider how often you should have your eyes checked. Personal lifestyle choices, including: Daily care of your teeth and gums. Regular physical activity. Eating a healthy diet. Avoiding tobacco and drug use. Limiting alcohol use. Practicing safe sex. Taking low doses of aspirin every day. Taking vitamin and mineral supplements as recommended by your health care provider. What happens during an annual well check? The services and screenings done by your health care provider during your annual well check will depend on your age, overall health, lifestyle risk factors, and family history of disease. Counseling  Your health care provider may ask you questions about your: Alcohol use. Tobacco use. Drug use. Emotional well-being. Home and relationship well-being. Sexual activity. Eating habits. History of falls. Memory and ability to understand (cognition). Work and work  Statistician. Screening  You may have the following tests or measurements: Height, weight, and BMI. Blood pressure. Lipid and cholesterol levels. These may be checked every 5 years, or more frequently if you are over 33 years old. Skin check. Lung cancer screening. You may have this screening every year starting at age 56 if you have a 30-pack-year history of smoking and currently smoke or have quit within the past 15 years. Fecal occult blood test (FOBT) of the stool. You may have this test every year starting at age 6. Flexible sigmoidoscopy or colonoscopy. You may have a sigmoidoscopy every 5 years or a colonoscopy every 10 years starting at age 38. Prostate cancer screening. Recommendations will vary depending on your family history and other risks. Hepatitis C blood test. Hepatitis B blood test. Sexually transmitted disease (STD) testing. Diabetes screening. This is done by checking your blood sugar (glucose) after you have not eaten for a while (fasting). You may have this done every 1-3 years. Abdominal aortic aneurysm (AAA) screening. You may need this if you are a current or former smoker. Osteoporosis. You may be screened starting at age 69 if you are at high risk. Talk with your health care provider about your test results, treatment options, and if necessary, the need for more tests. Vaccines  Your health care provider may recommend certain vaccines, such as: Influenza vaccine. This is recommended every year. Tetanus, diphtheria, and acellular pertussis (Tdap, Td) vaccine. You may need a Td booster every 10 years. Zoster vaccine. You may need this after age 77. Pneumococcal 13-valent conjugate (PCV13) vaccine. One dose is recommended after age 64. Pneumococcal polysaccharide (PPSV23) vaccine. One dose is recommended after age 67. Talk to your health care provider about which screenings and vaccines you need and how often you  need them. This information is not intended to replace  advice given to you by your health care provider. Make sure you discuss any questions you have with your health care provider. Document Released: 12/31/2015 Document Revised: 08/23/2016 Document Reviewed: 10/05/2015 Elsevier Interactive Patient Education  2017 [place] Prevention in the Home Falls can cause injuries. They can happen to people of all ages. There are many things you can do to make your home safe and to help prevent falls. What can I do on the outside of my home? Regularly fix the edges of walkways and driveways and fix any cracks. Remove anything that might make you trip as you walk through a door, such as a raised step or threshold. Trim any bushes or trees on the path to your home. Use bright outdoor lighting. Clear any walking paths of anything that might make someone trip, such as rocks or tools. Regularly check to see if handrails are loose or broken. Make sure that both sides of any steps have handrails. Any raised decks and porches should have guardrails on the edges. Have any leaves, snow, or ice cleared regularly. Use sand or salt on walking paths during winter. Clean up any spills in your garage right away. This includes oil or grease spills. What can I do in the bathroom? Use night lights. Install grab bars by the toilet and in the tub and shower. Do not use towel bars as grab bars. Use non-skid mats or decals in the tub or shower. If you need to sit down in the shower, use a plastic, non-slip stool. Keep the floor dry. Clean up any water that spills on the floor as soon as it happens. Remove soap buildup in the tub or shower regularly. Attach bath mats securely with double-sided non-slip rug tape. Do not have throw rugs and other things on the floor that can make you trip. What can I do in the bedroom? Use night lights. Make sure that you have a light by your bed that is easy to reach. Do not use any sheets or blankets that are too big for your bed.  They should not hang down onto the floor. Have a firm chair that has side arms. You can use this for support while you get dressed. Do not have throw rugs and other things on the floor that can make you trip. What can I do in the kitchen? Clean up any spills right away. Avoid walking on wet floors. Keep items that you use a lot in easy-to-reach places. If you need to reach something above you, use a strong step stool that has a grab bar. Keep electrical cords out of the way. Do not use floor polish or wax that makes floors slippery. If you must use wax, use non-skid floor wax. Do not have throw rugs and other things on the floor that can make you trip. What can I do with my stairs? Do not leave any items on the stairs. Make sure that there are handrails on both sides of the stairs and use them. Fix handrails that are broken or loose. Make sure that handrails are as long as the stairways. Check any carpeting to make sure that it is firmly attached to the stairs. Fix any carpet that is loose or worn. Avoid having throw rugs at the top or bottom of the stairs. If you do have throw rugs, attach them to the floor with carpet tape. Make sure that you have a light switch  at the top of the stairs and the bottom of the stairs. If you do not have them, ask someone to add them for you. What else can I do to help prevent falls? Wear shoes that: Do not have high heels. Have rubber bottoms. Are comfortable and fit you well. Are closed at the toe. Do not wear sandals. If you use a stepladder: Make sure that it is fully opened. Do not climb a closed stepladder. Make sure that both sides of the stepladder are locked into place. Ask someone to hold it for you, if possible. Clearly mark and make sure that you can see: Any grab bars or handrails. First and last steps. Where the edge of each step is. Use tools that help you move around (mobility aids) if they are needed. These  include: Canes. Walkers. Scooters. Crutches. Turn on the lights when you go into a dark area. Replace any light bulbs as soon as they burn out. Set up your furniture so you have a clear path. Avoid moving your furniture around. If any of your floors are uneven, fix them. If there are any pets around you, be aware of where they are. Review your medicines with your doctor. Some medicines can make you feel dizzy. This can increase your chance of falling. Ask your doctor what other things that you can do to help prevent falls. This information is not intended to replace advice given to you by your health care provider. Make sure you discuss any questions you have with your health care provider. Document Released: 09/30/2009 Document Revised: 05/11/2016 Document Reviewed: 01/08/2015 Elsevier Interactive Patient Education  2017 [employer].

## 2022-07-07 NOTE — Progress Notes (Signed)
Subjective:   Henry Ellis is a 66 y.o. male who presents for an subsequent Medicare Annual Wellness Visit.   I connected with Henry Ellis  today by telephone and verified that I am speaking with the correct person using two identifiers. Location patient: home Location provider: work Persons participating in the virtual visit: patient, provider.   I discussed the limitations, risks, security and privacy concerns of performing an evaluation and management service by telephone and the availability of in person appointments. I also discussed with the patient that there may be a patient responsible charge related to this service. The patient expressed understanding and verbally consented to this telephonic visit.    Interactive audio and video telecommunications were attempted between this provider and patient, however failed, due to patient having technical difficulties OR patient did not have access to video capability.  We continued and completed visit with audio only.    Review of Systems     Cardiac Risk Factors include: advanced age (>18mn, >>76women);male gender;dyslipidemia     Objective:    Today's Vitals   There is no height or weight on file to calculate BMI.     07/07/2022    1:39 PM 06/29/2021   11:56 AM 07/09/2017    9:31 AM 06/25/2017    9:56 AM 10/18/2016    6:46 AM 10/16/2016   11:21 AM  Advanced Directives  Does Patient Have a Medical Advance Directive? Yes Yes Yes Yes Yes Yes  Type of [place] will   Living will;Healthcare Power of [place] will [place]asc[place] will  Does patient want to make changes to medical advance directive?  No - Patient declined      Copy of [place]? No - copy requested    Yes     Current Medications (verified) Outpatient Encounter Medications as of 07/07/2022  Medication Sig   amLODipine (NORVASC) 2.5  MG tablet Take 1 tablet (2.5 mg total) by mouth daily.   Cholecalciferol (VITAMIN D3 PO) Take by mouth.   dapagliflozin propanediol (FARXIGA) 5 MG TABS tablet Take 1 tablet (5 mg total) by mouth daily before breakfast.   docusate sodium (COLACE) 100 MG capsule Take 100 mg by mouth daily.   doxycycline (VIBRA-TABS) 100 MG tablet Take 1 tablet (100 mg total) by mouth 2 (two) times daily.   esomeprazole (NEXIUM) 20 MG capsule Take 1 capsule (20 mg total) by mouth daily at 12 noon.   ferrous sulfate 325 (65 FE) MG tablet Take 325 mg by mouth daily with breakfast.   finasteride (PROSCAR) 5 MG tablet Take 5 mg by mouth daily.   Psyllium 30.9 % POWD Take 1 packet by mouth daily.   rivaroxaban (XARELTO) 20 MG TABS tablet Take 1 tablet (20 mg total) by mouth daily with supper.   solifenacin (VESICARE) 5 MG tablet Take 1 tablet (5 mg total) by mouth daily.   atorvastatin (LIPITOR) 40 MG tablet Take 1 tablet (40 mg total) by mouth daily.   canagliflozin (INVOKANA) 100 MG TABS tablet Take 1 tablet (100 mg total) by mouth daily before breakfast. (Patient not taking: Reported on 07/07/2022)   No facility-administered encounter medications on file as of 07/07/2022.    Allergies (verified) Bee venom, Aspirin, Amoxicillin, Apixaban, and Other   History: Past Medical History:  Diagnosis Date   Allergy    seasonal allergies   Anemia    on meds  Anxiety    hx of   Arthritis    generalized   Barrett's esophagus    Cancer ([place]) 10/17/2016   kidney cancer(removed left kidney in 2017)   Carpal tunnel syndrome, right 04/21/2011   Chronic bronchitis    Chronic kidney disease 2017   2017-had left kidney removed   ED (erectile dysfunction)    Esophageal reflux    on meds   Esophageal stricture    Essential hypertension 10/19/2015   on meds   H/O adenomatous polyp of colon 06/05/2012   Hiatal hernia    High cholesterol    on meds   History of Bell's palsy    IBS (irritable bowel syndrome)    Personal  history of colonic polyps 04/15/2008 & 04/22/2012   TUBULAR ADENOMA   Ulcerative esophagitis    Past Surgical History:  Procedure Laterality Date   CARPAL TUNNEL RELEASE     left   INGUINAL HERNIA REPAIR     bilateral   KNEE ARTHROSCOPY     right [name]   ROBOT ASSISTED LAPAROSCOPIC NEPHRECTOMY Left 10/18/2016   Procedure: XI ROBOTIC ASSISTED LAPAROSCOPIC RADICAL NEPHRECTOMY;  Surgeon: Alexis Frock, MD;  Location: [facility];  Service: Urology;  Laterality: Left;   WISDOM TOOTH EXTRACTION     Family History  Problem Relation Age of Onset   Heart disease Father    Lung cancer Father    Hypertension Brother        THROUGHOUT FAMILY   Colon cancer Neg Hx    Stomach cancer Neg Hx    Esophageal cancer Neg Hx    Rectal cancer Neg Hx    Colon polyps Neg Hx    Social History   Socioeconomic History   Marital status: Single    Spouse name: Not on file   Number of children: Not on file   Years of education: Not on file   Highest education level: Not on file  Occupational History   Occupation: Dealer    Employer: [employer]  Tobacco Use   Smoking status: Former    Packs/day: 1.00    Types: Cigarettes    Quit date: 10/17/2016    Years since quitting: 5.7   Smokeless tobacco: Never   Tobacco comments:    Counseling sheet given 03-2012   Vaping Use   Vaping Use: Former  Substance and Sexual Activity   Alcohol use: Yes    Alcohol/week: 3.0 standard drinks of alcohol    Types: 3 Shots of liquor per week   Drug use: Not Currently    Frequency: 2.0 times per week    Types: Marijuana    Comment: last use June 05, 2016   Sexual activity: Not on file  Other Topics Concern   Not on file  Social History Narrative   Not on file   Social Determinants of Health   Financial Resource Strain: Low Risk  (07/07/2022)   Overall Financial Resource Strain (CARDIA)    Difficulty of Paying Living Expenses: Not hard at all  Food Insecurity: No Food Insecurity (07/07/2022)    Hunger Vital Sign    Worried About Running Out of Food in the Last Year: Never true    [place] in the Last Year: Never true  Transportation Needs: No Transportation Needs (07/07/2022)   PRAPARE - Hydrologist (Medical): No    Lack of Transportation (Non-Medical): No  Physical Activity: Sufficiently Active (07/07/2022)   Exercise Vital Sign  Days of Exercise per Week: 5 days    Minutes of Exercise per Session: 30 min  Stress: No Stress Concern Present (07/07/2022)   [place]    Feeling of Stress : Not at all  Social Connections: Socially Isolated (07/07/2022)   Social Connection and Isolation Panel [NHANES]    Frequency of Communication with Friends and Family: Three times a week    Frequency of Social Gatherings with Friends and Family: Three times a week    Attends Religious Services: Never    Active Member of Clubs or Organizations: No    Attends Archivist Meetings: Never    Marital Status: Never married    Tobacco Counseling Counseling given: Not Answered Tobacco comments: Counseling sheet given 03-2012    Clinical Intake:  Pre-visit preparation completed: Yes  Pain : No/denies pain     Nutritional Risks: None Diabetes: No  How often do you need to have someone help you when you read instructions, pamphlets, or other written materials from your doctor or pharmacy?: 1 - Never What is the last grade level you completed in school?: [place]   Interpreter Needed?: No  Information entered by :: [place] of Daily Living    07/07/2022    1:43 PM  In your present state of health, do you have any difficulty performing the following activities:  Hearing? 0  Vision? 0  Difficulty concentrating or making decisions? 0  Walking or climbing stairs? 0  Dressing or bathing? 0  Doing errands, shopping? 0  Preparing Food and eating  ? N  Using the Toilet? N  In the past six months, have you accidently leaked urine? N  Do you have problems with loss of bowel control? N  Managing your Medications? N  Managing your Finances? N  Housekeeping or managing your Housekeeping? N    Patient Care Team: Biagio Borg, MD as PCP - General Werner Lean, MD as PCP - Cardiology (Cardiology)  Indicate any recent Medical Services you may have received from other than [facility] providers in the past year (date may be approximate).     Assessment:   This is a routine wellness examination for [place].  Hearing/Vision screen Vision Screening - Comments:: Annual eye exams wears glasses   Dietary issues and exercise activities discussed: Current Exercise Habits: Home exercise routine, Type of exercise: walking, Time (Minutes): 30, Frequency (Times/Week): 5, Weekly Exercise (Minutes/Week): 150, Intensity: Mild, Exercise limited by: None identified   Goals Addressed   None    Depression Screen    07/07/2022    1:43 PM 07/07/2022    1:40 PM 07/07/2022    1:38 PM 05/17/2022    8:45 AM 02/24/2022    8:57 AM 02/24/2022    8:12 AM 08/26/2021    9:53 AM  PHQ 2/9 Scores  PHQ - 2 Score 0 0 0 0 0 0 1  PHQ- 9 Score    0       Fall Risk    07/07/2022    1:40 PM 05/17/2022    8:44 AM 02/24/2022    8:57 AM 02/24/2022    8:12 AM 08/26/2021    9:53 AM  [place] in the past year? 0 0 0 0 0  Number falls in past yr: 0 0 0 0 0  Injury with Fall? 0 0 0 0 0  Risk for fall due to : No Fall  Risks      Follow up Falls evaluation completed;Education provided        FALL RISK PREVENTION PERTAINING TO THE HOME:  Any stairs in or around the home? No  If so, are there any without handrails? No  Home free of loose throw rugs in walkways, pet beds, electrical cords, etc? Yes  Adequate lighting in your home to reduce risk of falls? Yes   ASSISTIVE DEVICES UTILIZED TO PREVENT FALLS:  Life alert? No  Use of a cane, walker or w/c? No   Grab bars in the bathroom? No  Shower chair or bench in shower? No  Elevated toilet seat or a handicapped toilet? No     Cognitive Function:  Normal cognitive status assessed by telephone conversation  by this Nurse Health Advisor. No abnormalities found.        Immunizations Immunization History  Administered Date(s) Administered   Fluad Quad(high Dose 65+) 09/16/2020, 08/26/2021   Influenza Split 09/27/2012   Influenza Whole 09/07/2008, 12/04/2011   Influenza,inj,Quad PF,6+ Mos 10/03/2013, 10/09/2014, 10/19/2016, 01/25/2018   Influenza-Unspecified 08/30/2018   PFIZER(Purple Top)SARS-COV-2 Vaccination 01/09/2020, 01/30/2020, 01/25/2021   Pneumococcal Conjugate-13 08/01/2019   Pneumococcal Polysaccharide-23 03/05/2009, 10/19/2016, 02/24/2022   Td 03/05/2009   Tdap 08/01/2019   Zoster, Live 04/16/2015    TDAP status: Up to date  Flu Vaccine status: Up to date  Pneumococcal vaccine status: Up to date  Covid-19 vaccine status: Completed vaccines  Qualifies for Shingles Vaccine? Yes   Zostavax completed No   Shingrix Completed?: No.    Education has been provided regarding the importance of this vaccine. Patient has been advised to call insurance company to determine out of pocket expense if they have not yet received this vaccine. Advised may also receive vaccine at local pharmacy or Health Dept. Verbalized acceptance and understanding.  Screening Tests Health Maintenance  Topic Date Due   Zoster Vaccines- Shingrix (1 of 2) Never done   Diabetic kidney evaluation - Urine ACR  02/12/2021   COVID-19 Vaccine (4 - Booster for Pfizer series) 03/22/2021   INFLUENZA VACCINE  07/18/2022   HEMOGLOBIN A1C  08/23/2022   Diabetic kidney evaluation - GFR measurement  02/21/2023   FOOT EXAM  02/25/2023   OPHTHALMOLOGY EXAM  03/18/2023   COLONOSCOPY (Pts 45-102yr Insurance coverage will need to be confirmed)  09/24/2023   TETANUS/TDAP  07/31/2029   Pneumonia Vaccine 66 Years old   Completed   Hepatitis C Screening  Completed   HPV VACCINES  Aged Out    Health Maintenance  Health Maintenance Due  Topic Date Due   Zoster Vaccines- Shingrix (1 of 2) Never done   Diabetic kidney evaluation - Urine ACR  02/12/2021   COVID-19 Vaccine (4 - Booster for Pfizer series) 03/22/2021    Colorectal cancer screening: Type of screening: Colonoscopy. Completed 09/23/2020. Repeat every 3 years  Lung Cancer Screening: (Low Dose CT Chest recommended if Age 68-80years, 30 pack-year currently smoking OR have quit w/in 15years.) does not qualify.   Lung Cancer Screening Referral: n/a  Additional Screening:  Hepatitis C Screening: does not qualify;   Vision Screening: Recommended annual ophthalmology exams for early detection of glaucoma and other disorders of the eye. Is the patient up to date with their annual eye exam?  Yes  Who is the provider or what is the name of the office in which the patient attends annual eye exams? Dr.Miller  If pt is not established with a provider, would they like to be  referred to a provider to establish care? No .   Dental Screening: Recommended annual dental exams for proper oral hygiene  Community Resource Referral / Chronic Care Management: CRR required this visit?  No   CCM required this visit?  No      Plan:     I have personally reviewed and noted the following in the patient's chart:   Medical and social history Use of alcohol, tobacco or illicit drugs  Current medications and supplements including opioid prescriptions. Patient is not currently taking opioid prescriptions. Functional ability and status Nutritional status Physical activity Advanced directives List of other physicians Hospitalizations, surgeries, and ER visits in previous 12 months Vitals Screenings to include cognitive, depression, and falls Referrals and appointments  In addition, I have reviewed and discussed with patient certain preventive protocols,  quality metrics, and best practice recommendations. A written personalized care plan for preventive services as well as general preventive health recommendations were provided to patient.     Randel Pigg, LPN   [id number]/50/[id number]   Nurse Notes: none

## 2022-07-27 DIAGNOSIS — R972 Elevated prostate specific antigen [PSA]: Secondary | ICD-10-CM | POA: Diagnosis not present

## 2022-07-28 DIAGNOSIS — L600 Ingrowing nail: Secondary | ICD-10-CM | POA: Diagnosis not present

## 2022-07-28 DIAGNOSIS — B351 Tinea unguium: Secondary | ICD-10-CM | POA: Diagnosis not present

## 2022-07-28 DIAGNOSIS — M79675 Pain in left toe(s): Secondary | ICD-10-CM | POA: Diagnosis not present

## 2022-07-28 DIAGNOSIS — M79674 Pain in right toe(s): Secondary | ICD-10-CM | POA: Diagnosis not present

## 2022-08-03 DIAGNOSIS — C642 Malignant neoplasm of left kidney, except renal pelvis: Secondary | ICD-10-CM | POA: Diagnosis not present

## 2022-08-03 DIAGNOSIS — R972 Elevated prostate specific antigen [PSA]: Secondary | ICD-10-CM | POA: Diagnosis not present

## 2022-08-03 DIAGNOSIS — Z905 Acquired absence of kidney: Secondary | ICD-10-CM | POA: Diagnosis not present

## 2022-08-23 ENCOUNTER — Other Ambulatory Visit (INDEPENDENT_AMBULATORY_CARE_PROVIDER_SITE_OTHER): Payer: Medicare Other

## 2022-08-23 DIAGNOSIS — E559 Vitamin D deficiency, unspecified: Secondary | ICD-10-CM | POA: Diagnosis not present

## 2022-08-23 DIAGNOSIS — R7302 Impaired glucose tolerance (oral): Secondary | ICD-10-CM | POA: Diagnosis not present

## 2022-08-23 LAB — HEPATIC FUNCTION PANEL
ALT: 15 U/L (ref 0–53)
AST: 14 U/L (ref 0–37)
Albumin: 3.7 g/dL (ref 3.5–5.2)
Alkaline Phosphatase: 83 U/L (ref 39–117)
Bilirubin, Direct: 0.2 mg/dL (ref 0.0–0.3)
Total Bilirubin: 0.6 mg/dL (ref 0.2–1.2)
Total Protein: 7.1 g/dL (ref 6.0–8.3)

## 2022-08-23 LAB — LIPID PANEL
Cholesterol: 108 mg/dL (ref 0–200)
HDL: 46.2 mg/dL (ref >39.00)
LDL Cholesterol: 49 mg/dL (ref 0–99)
NonHDL: 61.37
Total CHOL/HDL Ratio: 2
Triglycerides: 60 mg/dL (ref 0.0–149.0)
VLDL: 12 mg/dL (ref 0.0–40.0)

## 2022-08-23 LAB — BASIC METABOLIC PANEL
BUN: 23 mg/dL (ref 6–23)
CO2: 28 mEq/L (ref 19–32)
Calcium: 9.1 mg/dL (ref 8.4–10.5)
Chloride: 102 mEq/L (ref 96–112)
Creatinine, Ser: 1.49 mg/dL (ref 0.40–1.50)
GFR: 48.02 mL/min — ABNORMAL LOW (ref >60.00)
Glucose, Bld: 127 mg/dL — ABNORMAL HIGH (ref 70–99)
Potassium: 4.2 mEq/L (ref 3.5–5.1)
Sodium: 137 mEq/L (ref 135–145)

## 2022-08-23 LAB — HEMOGLOBIN A1C: Hgb A1c MFr Bld: 6.3 % (ref 4.6–6.5)

## 2022-08-23 LAB — VITAMIN D 25 HYDROXY (VIT D DEFICIENCY, FRACTURES): VITD: 58.33 ng/mL (ref 30.00–100.00)

## 2022-08-28 ENCOUNTER — Ambulatory Visit (INDEPENDENT_AMBULATORY_CARE_PROVIDER_SITE_OTHER): Payer: Medicare Other | Admitting: Internal Medicine

## 2022-08-28 VITALS — BP 118/72 | HR 60 | Temp 97.9°F | Ht 73.0 in | Wt 299.0 lb

## 2022-08-28 DIAGNOSIS — Z23 Encounter for immunization: Secondary | ICD-10-CM

## 2022-08-28 DIAGNOSIS — E7800 Pure hypercholesterolemia, unspecified: Secondary | ICD-10-CM

## 2022-08-28 DIAGNOSIS — N1831 Chronic kidney disease, stage 3a: Secondary | ICD-10-CM | POA: Diagnosis not present

## 2022-08-28 DIAGNOSIS — I700 Atherosclerosis of aorta: Secondary | ICD-10-CM

## 2022-08-28 DIAGNOSIS — I10 Essential (primary) hypertension: Secondary | ICD-10-CM

## 2022-08-28 DIAGNOSIS — R9431 Abnormal electrocardiogram [ECG] [EKG]: Secondary | ICD-10-CM

## 2022-08-28 DIAGNOSIS — E538 Deficiency of other specified B group vitamins: Secondary | ICD-10-CM

## 2022-08-28 DIAGNOSIS — E559 Vitamin D deficiency, unspecified: Secondary | ICD-10-CM

## 2022-08-28 DIAGNOSIS — Z125 Encounter for screening for malignant neoplasm of prostate: Secondary | ICD-10-CM

## 2022-08-28 DIAGNOSIS — R7302 Impaired glucose tolerance (oral): Secondary | ICD-10-CM

## 2022-08-28 NOTE — Progress Notes (Unsigned)
Patient ID: Henry Ellis, male   DOB: 20-Jan-1956, 66 y.o.   MRN: [id number]        Chief Complaint: follow up HTN, HLD and hyperglycemia, ckd, arotic atherosclerosis       HPI:  Henry Ellis is a 66 y.o. male here overall doing ok;  Pt denies chest pain, increased sob or doe, wheezing, orthopnea, PND, increased LE swelling, palpitations, dizziness or syncope.   Pt denies polydipsia, polyuria, or new focal neuro s/s.    Pt denies fever, wt loss, night sweats, loss of appetite, or other constitutional symptoms  Last saw urology approx 2 wks ago, with stable PSA, no procedures planned , no longer having yearly CT renal.   Due for shingrix.  Willing for Cardiac CT score.   Wt Readings from Last 3 Encounters:  08/28/22 299 lb (135.6 kg)  05/17/22 (!) 301 lb (136.5 kg)  03/16/22 (!) 308 lb (139.7 kg)   BP Readings from Last 3 Encounters:  08/28/22 118/72  05/17/22 118/70  03/16/22 119/80         Past Medical History:  Diagnosis Date   Allergy    seasonal allergies   Anemia    on meds   Anxiety    hx of   Arthritis    generalized   Barrett's esophagus    Cancer ([place]) 10/17/2016   kidney cancer(removed left kidney in 2017)   Carpal tunnel syndrome, right 04/21/2011   Chronic bronchitis    Chronic kidney disease 2017   2017-had left kidney removed   ED (erectile dysfunction)    Esophageal reflux    on meds   Esophageal stricture    Essential hypertension 10/19/2015   on meds   H/O adenomatous polyp of colon 06/05/2012   Hiatal hernia    High cholesterol    on meds   History of Bell's palsy    IBS (irritable bowel syndrome)    Personal history of colonic polyps 04/15/2008 & 04/22/2012   TUBULAR ADENOMA   Ulcerative esophagitis    Past Surgical History:  Procedure Laterality Date   CARPAL TUNNEL RELEASE     left   INGUINAL HERNIA REPAIR     bilateral   KNEE ARTHROSCOPY     right [name]   ROBOT ASSISTED LAPAROSCOPIC NEPHRECTOMY Left 10/18/2016   Procedure: XI  ROBOTIC ASSISTED LAPAROSCOPIC RADICAL NEPHRECTOMY;  Surgeon: Alexis Frock, MD;  Location: [facility];  Service: Urology;  Laterality: Left;   WISDOM TOOTH EXTRACTION      reports that he quit smoking about 5 years ago. His smoking use included cigarettes. He smoked an average of 1 pack per day. He has never used smokeless tobacco. He reports current alcohol use of about 3.0 standard drinks of alcohol per week. He reports that he does not currently use drugs after having used the following drugs: Marijuana. Frequency: 2.00 times per week. family history includes Heart disease in his father; Hypertension in his brother; Lung cancer in his father. Allergies  Allergen Reactions   Bee Venom Anaphylaxis   Aspirin Nausea Only   Amoxicillin Rash    Did PCN reaction causing immediate rash, facial/tongue/throat swelling, SOB or lightheadedness with hypotension: no Did PCN reaction causing severe rash involving mucus membranes or skin necrosis: no Did PCN reaction that required hospitalization : no Did pcn reaction occurring within the last 10 years: no If all of the above answers are "NO", then may proceed with Cephalosporin use. Pt states he can take low doses  of amoxicillin, has taken for dental procedures without reaction   Apixaban Itching   Other Rash    Aspirin cream    Current Outpatient Medications on File Prior to Visit  Medication Sig Dispense Refill   amLODipine (NORVASC) 2.5 MG tablet Take 1 tablet (2.5 mg total) by mouth daily. 90 tablet 3   Cholecalciferol (VITAMIN D3 PO) Take by mouth.     dapagliflozin propanediol (FARXIGA) 5 MG TABS tablet Take 1 tablet (5 mg total) by mouth daily before breakfast. 90 tablet 3   docusate sodium (COLACE) 100 MG capsule Take 100 mg by mouth daily.     esomeprazole (NEXIUM) 20 MG capsule Take 1 capsule (20 mg total) by mouth daily at 12 noon. 90 capsule 3   ferrous sulfate 325 (65 FE) MG tablet Take 325 mg by mouth daily with breakfast.     finasteride  (PROSCAR) 5 MG tablet Take 5 mg by mouth daily.     Psyllium 30.9 % POWD Take 1 packet by mouth daily.     rivaroxaban (XARELTO) 20 MG TABS tablet Take 1 tablet (20 mg total) by mouth daily with supper. 90 tablet 3   solifenacin (VESICARE) 5 MG tablet Take 1 tablet (5 mg total) by mouth daily. 90 tablet 3   atorvastatin (LIPITOR) 40 MG tablet Take 1 tablet (40 mg total) by mouth daily. 90 tablet 3   No current facility-administered medications on file prior to visit.        ROS:  All others reviewed and negative.  Objective        PE:  BP 118/72 (BP Location: Right Arm, Patient Position: Sitting, Cuff Size: Large)   Pulse 60   Temp 97.9 F (36.6 C) (Oral)   Ht '6\' 1"'$  (1.854 m)   Wt 299 lb (135.6 kg)   SpO2 98%   BMI 39.45 kg/m                 Constitutional: Pt appears in NAD               HENT: Head: NCAT.                Right Ear: External ear normal.                 Left Ear: External ear normal.                Eyes: . Pupils are equal, round, and reactive to light. Conjunctivae and EOM are normal               Nose: without d/c or deformity               Neck: Neck supple. Gross normal ROM               Cardiovascular: Normal rate and regular rhythm.                 Pulmonary/Chest: Effort normal and breath sounds without rales or wheezing.                Abd:  Soft, NT, ND, + BS, no organomegaly               Neurological: Pt is alert. At baseline orientation, motor grossly intact               Skin: Skin is warm. No rashes, no other new lesions, LE edema - none  Psychiatric: Pt behavior is normal without agitation   Micro: none  Cardiac tracings I have personally interpreted today:  none  Pertinent Radiological findings (summarize): none   Lab Results  Component Value Date   WBC 5.2 03/31/2022   HGB 12.8 (L) 03/31/2022   HCT 38.9 03/31/2022   PLT 194 03/31/2022   GLUCOSE 127 (H) 08/23/2022   CHOL 108 08/23/2022   TRIG 60.0 08/23/2022   HDL 46.20  08/23/2022   LDLDIRECT 151.7 03/25/2010   LDLCALC 49 08/23/2022   ALT 15 08/23/2022   AST 14 08/23/2022   NA 137 08/23/2022   K 4.2 08/23/2022   CL 102 08/23/2022   CREATININE 1.49 08/23/2022   BUN 23 08/23/2022   CO2 28 08/23/2022   TSH 0.83 02/20/2022   PSA 1.57 02/20/2022   HGBA1C 6.3 08/23/2022   MICROALBUR 2.4 (H) 02/13/2020   Assessment/Plan:  Henry Ellis is a 66 y.o. White or Caucasian [1] male with  has a past medical history of Allergy, Anemia, Anxiety, Arthritis, Barrett's esophagus, Cancer ([place]) (10/17/2016), Carpal tunnel syndrome, right (04/21/2011), Chronic bronchitis, Chronic kidney disease (2017), ED (erectile dysfunction), Esophageal reflux, Esophageal stricture, Essential hypertension (10/19/2015), H/O adenomatous polyp of colon (06/05/2012), Hiatal hernia, High cholesterol, History of Bell's palsy, IBS (irritable bowel syndrome), Personal history of colonic polyps (04/15/2008 & 04/22/2012), and Ulcerative esophagitis.  Aortic atherosclerosis (HCC) Pt to continue lipitor 40 mg, low chol diet, excercise  CKD (chronic kidney disease) Lab Results  Component Value Date   CREATININE 1.49 08/23/2022   Stable overall, cont to avoid nephrotoxins   Essential hypertension BP Readings from Last 3 Encounters:  08/28/22 118/72  05/17/22 118/70  03/16/22 119/80   Stable, pt to continue medical treatment norvasc 2.5 mg qd   HYPERCHOLESTEROLEMIA Lab Results  Component Value Date   LDLCALC 49 08/23/2022   Stable, pt to continue current statin lipitor 40 mg qd, also for cardiac CT score testing   Impaired glucose tolerance Lab Results  Component Value Date   HGBA1C 6.3 08/23/2022   Stable, pt to continue current medical treatment farxiga 5 mg qd  Followup: No follow-ups on file.  Cathlean Cower, MD 08/30/2022 9:22 PM [facility]

## 2022-08-28 NOTE — Patient Instructions (Addendum)
You had the flu shot today  Please have your Shingrix (shingles) shots done at your local pharmacy.  You will be contacted regarding the referral for: Cardiac CT score  Please continue all other medications as before, and refills have been done if requested.  Please have the pharmacy call with any other refills you may need.  Please continue your efforts at being more active, low cholesterol diet, and weight control.  You are otherwise up to date with prevention measures today.  Please keep your appointments with your specialists as you may have planned  Please make an Appointment to return in 6 months, or sooner if needed, also with Lab Appointment for testing done 3-5 days before at the [facility] (so this is for TWO appointments - please see the scheduling desk as you leave)

## 2022-08-30 NOTE — Assessment & Plan Note (Addendum)
Lab Results  Component Value Date   LDLCALC 49 08/23/2022   Stable, pt to continue current statin lipitor 40 mg qd, also for cardiac CT score testing

## 2022-08-30 NOTE — Assessment & Plan Note (Signed)
Lab Results  Component Value Date   HGBA1C 6.3 08/23/2022   Stable, pt to continue current medical treatment farxiga 5 mg qd

## 2022-08-30 NOTE — Assessment & Plan Note (Signed)
BP Readings from Last 3 Encounters:  08/28/22 118/72  05/17/22 118/70  03/16/22 119/80   Stable, pt to continue medical treatment norvasc 2.5 mg qd

## 2022-08-30 NOTE — Assessment & Plan Note (Signed)
Pt to continue lipitor 40 mg, low chol diet, excercise

## 2022-08-30 NOTE — Assessment & Plan Note (Signed)
Lab Results  Component Value Date   CREATININE 1.49 08/23/2022   Stable overall, cont to avoid nephrotoxins

## 2022-09-09 NOTE — Progress Notes (Unsigned)
Cardiology Office Note:    Date:  09/11/2022   ID:  Henry Ellis, Henry Ellis 1956-09-13, MRN [id number]  PCP:  Biagio Borg, MD   [place]  Cardiologist:  Werner Lean, MD  Advanced Practice Provider:  No care team member to display Electrophysiologist:  None      CC: AFL f/u  History of Present Illness:    Henry Ellis is a 66 y.o. male with a hx of HTN, Aortic Atherosclerosis HLD, Distant smoking in 2017; morbid obesiity who presents fore evaluation 03/11/21.  2022: In interim of this visit, patient had normal echo with stable leg swelling; he retired. 2023: Echo with normal LV function, mild LA dilation, had itching with eliquis and needed Xarelto  Patient notes that he is doing well.   Since last visit notes no issues with Xarelto . There are no interval hospital/ED visit.    No chest pain or pressure.  No SOB/DOE and no PND/Orthopnea.  No weight gain or leg swelling.  No palpitations or syncope.   Past Medical History:  Diagnosis Date   Allergy    seasonal allergies   Anemia    on meds   Anxiety    hx of   Arthritis    generalized   Barrett's esophagus    Cancer ([place]) 10/17/2016   kidney cancer(removed left kidney in 2017)   Carpal tunnel syndrome, right 04/21/2011   Chronic bronchitis    Chronic kidney disease 2017   2017-had left kidney removed   ED (erectile dysfunction)    Esophageal reflux    on meds   Esophageal stricture    Essential hypertension 10/19/2015   on meds   H/O adenomatous polyp of colon 06/05/2012   Hiatal hernia    High cholesterol    on meds   History of Bell's palsy    IBS (irritable bowel syndrome)    Personal history of colonic polyps 04/15/2008 & 04/22/2012   TUBULAR ADENOMA   Ulcerative esophagitis     Past Surgical History:  Procedure Laterality Date   CARPAL TUNNEL RELEASE     left   INGUINAL HERNIA REPAIR     bilateral   KNEE ARTHROSCOPY     right [name]   ROBOT ASSISTED  LAPAROSCOPIC NEPHRECTOMY Left 10/18/2016   Procedure: XI ROBOTIC ASSISTED LAPAROSCOPIC RADICAL NEPHRECTOMY;  Surgeon: Alexis Frock, MD;  Location: [facility];  Service: Urology;  Laterality: Left;   WISDOM TOOTH EXTRACTION      Current Medications: Current Meds  Medication Sig   amLODipine (NORVASC) 2.5 MG tablet Take 1 tablet (2.5 mg total) by mouth daily.   atorvastatin (LIPITOR) 40 MG tablet Take 1 tablet (40 mg total) by mouth daily.   Cholecalciferol (VITAMIN D3 PO) Take by mouth.   dapagliflozin propanediol (FARXIGA) 5 MG TABS tablet Take 1 tablet (5 mg total) by mouth daily before breakfast.   docusate sodium (COLACE) 100 MG capsule Take 100 mg by mouth daily.   esomeprazole (NEXIUM) 20 MG capsule Take 1 capsule (20 mg total) by mouth daily at 12 noon.   ferrous sulfate 325 (65 FE) MG tablet Take 325 mg by mouth daily with breakfast.   finasteride (PROSCAR) 5 MG tablet Take 5 mg by mouth daily.   Psyllium 30.9 % POWD Take 1 packet by mouth daily.   rivaroxaban (XARELTO) 20 MG TABS tablet Take 1 tablet (20 mg total) by mouth daily with supper.   solifenacin (VESICARE) 5 MG tablet  Take 1 tablet (5 mg total) by mouth daily.     Allergies:   Bee venom, Amoxicillin, Apixaban, Aspirin, and Other   Social History   Socioeconomic History   Marital status: Single    Spouse name: Not on file   Number of children: Not on file   Years of education: Not on file   Highest education level: Not on file  Occupational History   Occupation: Dealer    Employer: [employer]  Tobacco Use   Smoking status: Former    Packs/day: 1.00    Types: Cigarettes    Quit date: 10/17/2016    Years since quitting: 5.9   Smokeless tobacco: Never   Tobacco comments:    Counseling sheet given 03-2012   Vaping Use   Vaping Use: Former  Substance and Sexual Activity   Alcohol use: Yes    Alcohol/week: 3.0 standard drinks of alcohol    Types: 3 Shots of liquor per week   Drug use: Not Currently     Frequency: 2.0 times per week    Types: Marijuana    Comment: last use June 05, 2016   Sexual activity: Not on file  Other Topics Concern   Not on file  Social History Narrative   Not on file   Social Determinants of Health   Financial Resource Strain: Low Risk  (07/07/2022)   Overall Financial Resource Strain (CARDIA)    Difficulty of Paying Living Expenses: Not hard at all  Food Insecurity: No Food Insecurity (07/07/2022)   Hunger Vital Sign    Worried About Running Out of Food in the Last Year: Never true    [place] in the Last Year: Never true  Transportation Needs: No Transportation Needs (07/07/2022)   PRAPARE - Hydrologist (Medical): No    Lack of Transportation (Non-Medical): No  Physical Activity: Sufficiently Active (07/07/2022)   Exercise Vital Sign    Days of Exercise per Week: 5 days    Minutes of Exercise per Session: 30 min  Stress: No Stress Concern Present (07/07/2022)   [place]    Feeling of Stress : Not at all  Social Connections: Socially Isolated (07/07/2022)   Social Connection and Isolation Panel [NHANES]    Frequency of Communication with Friends and Family: Three times a week    Frequency of Social Gatherings with Friends and Family: Three times a week    Attends Religious Services: Never    Active Member of Clubs or Organizations: No    Attends Music therapist: Never    Marital Status: Never married    Social:  Retired; goes to [employer] with his nieces and nephews every summer (may stop as his sister had health problems)  Family History: The patient's family history includes Heart disease in his father; Hypertension in his brother; Lung cancer in his father. There is no history of Colon cancer, Stomach cancer, Esophageal cancer, Rectal cancer, or Colon polyps.  History of coronary artery disease notable for father and oldest  brother. History of heart failure notable for no members. History of arrhythmia notable for sister has pacemaker.  ROS:   Please see the history of present illness.     All other systems reviewed and are negative.  EKGs/Labs/Other Studies Reviewed:    The following studies were reviewed today:  EKG:   09/11/22: AFL rare PVC rate 60 03/16/22:  AFL rare PVC  02/18/21: SR 1st HB rate 82   ECHO COMPLETE WITH IMAGING ENHANCING AGENT 04/03/2022  Narrative ECHOCARDIOGRAM REPORT    Patient Name:   Cordae Mccarey Date of Exam: 03/31/2022 Medical Rec #:  [id number]         Height:       73.0 in Accession #:    [id number]        Weight:       308.0 lb Date of Birth:  [birth year]         BSA:          2.585 m Patient Age:    37 years          BP:           119/80 mmHg Patient Gender: M                 HR:           56 bpm. Exam Location:  [place]  Procedure: 2D Echo, Cardiac Doppler, Color Doppler and Intracardiac Opacification Agent  Indications:    I48.92* Unspecified atrial flutter  History:        Patient has prior history of Echocardiogram examinations, most recent 04/12/2021. COPD; Risk Factors:Hypertension, Dyslipidemia and Sleep Apnea. Aortic atherosclerosis. Norbid obesity. Peripheral edema. Chronic bronchitis.  Sonographer:    Diamond Nickel RCS Referring Phys: [facility] A Marco Raper  IMPRESSIONS   1. Left ventricular ejection fraction, by estimation, is 60 to 65%. The left ventricle has normal function. The left ventricle has no regional wall motion abnormalities. There is mild left ventricular hypertrophy. Left ventricular diastolic parameters are indeterminate. 2. Right ventricular systolic function is mildly reduced. The right ventricular size is mildly enlarged. There is normal pulmonary artery systolic pressure. 3. Left atrial size was mildly dilated. 4. The mitral valve is grossly normal. No evidence of mitral valve regurgitation. 5. The aortic valve is  grossly normal. Aortic valve regurgitation is not visualized. 6. The inferior vena cava is normal in size with greater than 50% respiratory variability, suggesting right atrial pressure of 3 mmHg.  Comparison(s): No significant change from prior study.  FINDINGS Left Ventricle: Left ventricular ejection fraction, by estimation, is 60 to 65%. The left ventricle has normal function. The left ventricle has no regional wall motion abnormalities. The left ventricular internal cavity size was normal in size. There is mild left ventricular hypertrophy. Left ventricular diastolic parameters are indeterminate.  Right Ventricle: The right ventricular size is mildly enlarged. Right vetricular wall thickness was not well visualized. Right ventricular systolic function is mildly reduced. There is normal pulmonary artery systolic pressure. The tricuspid regurgitant velocity is 2.06 m/s, and with an assumed right atrial pressure of 3 mmHg, the estimated right ventricular systolic pressure is 78.5 mmHg.  Left Atrium: Left atrial size was mildly dilated.  Right Atrium: Right atrial size was normal in size.  Pericardium: Trivial pericardial effusion is present.  Mitral Valve: The mitral valve is grossly normal. No evidence of mitral valve regurgitation.  Tricuspid Valve: The tricuspid valve is grossly normal. Tricuspid valve regurgitation is mild.  Aortic Valve: The aortic valve is grossly normal. Aortic valve regurgitation is not visualized.  Pulmonic Valve: The pulmonic valve was not well visualized. Pulmonic valve regurgitation is not visualized.  Aorta: The aortic root and ascending aorta are structurally normal, with no evidence of dilitation.  Venous: The inferior vena cava is normal in size with greater than 50% respiratory variability, suggesting right atrial pressure of 3 mmHg.  IAS/Shunts:  No atrial level shunt detected by color flow Doppler.   LEFT VENTRICLE PLAX 2D LVIDd:         5.20 cm    Diastology LVIDs:         3.50 cm   LV e' medial:    7.15 cm/s LV PW:         1.20 cm   LV E/e' medial:  8.8 LV IVS:        1.10 cm   LV e' lateral:   8.52 cm/s LVOT diam:     2.35 cm   LV E/e' lateral: 7.4 LV SV:         85 LV SV Index:   33 LVOT Area:     4.34 cm   RIGHT VENTRICLE RV S prime:     6.39 cm/s RVSP:           20.0 mmHg  LEFT ATRIUM              Index        RIGHT ATRIUM           Index LA diam:        4.30 cm  1.66 cm/m   RA Pressure: 3.00 mmHg LA Vol (A2C):   105.0 ml 40.62 ml/m  RA Area:     19.30 cm LA Vol (A4C):   78.6 ml  30.41 ml/m  RA Volume:   39.30 ml  15.20 ml/m LA Biplane Vol: 91.4 ml  35.36 ml/m AORTIC VALVE LVOT Vmax:   88.80 cm/s LVOT Vmean:  53.300 cm/s LVOT VTI:    0.196 m  AORTA Ao Root diam: 3.30 cm Ao Asc diam:  3.60 cm  MITRAL VALVE               TRICUSPID VALVE MV Area (PHT): 3.48 cm    TR Peak grad:   17.0 mmHg MV Decel Time: 218 msec    TR Vmax:        206.00 cm/s MV E velocity: 62.80 cm/s  Estimated RAP:  3.00 mmHg MV A velocity: 57.50 cm/s  RVSP:           20.0 mmHg MV E/A ratio:  1.09 SHUNTS Systemic VTI:  0.20 m Systemic Diam: 2.35 cm  Phineas Inches Electronically signed by Phineas Inches Signature Date/Time: 03/31/2022/10:11:49 AM    Final     NonCardiac CT : Date: 10/01/20 Results: Aortic Atherosclerosis without CAC  NM Stress Testing: Date: 04/05/2012 Results:  Negative per report  Recent Labs: 02/20/2022: TSH 0.83 03/31/2022: Hemoglobin 12.8; Platelets 194 08/23/2022: ALT 15; BUN 23; Creatinine, Ser 1.49; Potassium 4.2; Sodium 137  Recent Lipid Panel    Component Value Date/Time   CHOL 108 08/23/2022 0746   TRIG 60.0 08/23/2022 0746   HDL 46.20 08/23/2022 0746   CHOLHDL 2 08/23/2022 0746   VLDL 12.0 08/23/2022 0746   LDLCALC 49 08/23/2022 0746   LDLDIRECT 151.7 03/25/2010 1108    Physical Exam:    VS:  BP 122/70   Pulse 60   Ht '6\' 1"'$  (1.854 m)   Wt (!) 303 lb (137.4 kg)   SpO2 97%   BMI 39.98  kg/m     Wt Readings from Last 3 Encounters:  09/11/22 (!) 303 lb (137.4 kg)  08/28/22 299 lb (135.6 kg)  05/17/22 (!) 301 lb (136.5 kg)    Gen: no distress, morbid obesity   Neck: No JVD, thick neck Cardiac: No Rubs or Gallops, no Murmur, RRR distant  heart sounds  Respiratory: Clear to auscultation bilaterally, normal effort, normal  respiratory rate GI: Soft, nontender, non-distended  MS: Trace edema;  moves all extremities Integument: Skin feels well Neuro:  At time of evaluation, alert and oriented to person/place/time/situation  Psych: Normal affect, patient feels fine  ASSESSMENT:    1. Atrial flutter, unspecified type ([place])     PLAN:    Typical AFL, long standing persistent CHADSVASC 2, HASBLED 2, acquired thrombophilia - Xarelto (he has an itching allergy to Eliquis) - rare controled without AV nodal supression - we have discussed that if he were to have any asymptoms (CP, DOE, Palpitations, fatigue), we would offer DCCV - Echo in April 2024, if worsening LVEF would proceed with DCCV  Aortic atherosclerosis LCX CAC HLD LDL < 70 - LDL at goal <70 on atorvastatin 40 mg - reviewed lung cancer screening with patient, I do not feel strongly that he needs CAC score given his lung cancer assessments and CAC evaluation   CKD IIIa with one kidney LE Swelling Morbid Obesity COPD HTN - he has been hesitant to start lasix in 2022 and 2023, if leg swelling gets worse recommend lasix 20 mg Po daily - discussed compression stocking - continue invokana - on norvasc 2.5 mg PO daily, BP at goal (if worsening leg swelling may also transition to ACEi to ARB)  Six month APP (post April Echo) One year with me    Medication Adjustments/Labs and Tests Ordered: Current medicines are reviewed at length with the patient today.  Concerns regarding medicines are outlined above.  Orders Placed This Encounter  Procedures   EKG 12-Lead   ECHOCARDIOGRAM COMPLETE    No orders of  the defined types were placed in this encounter.    Patient Instructions  Medication Instructions:  Your physician recommends that you continue on your current medications as directed. Please refer to the Current Medication list given to you today.  *If you need a refill on your cardiac medications before your next appointment, please call your pharmacy*   Lab Work: NONE If you have labs (blood work) drawn today and your tests are completely normal, you will receive your results only by: [place] (if you have MyChart) OR A paper copy in the mail If you have any lab test that is abnormal or we need to change your treatment, we will call you to review the results.   Testing/Procedures: APRIL 2024: Your physician has requested that you have an echocardiogram. Echocardiography is a painless test that uses sound waves to create images of your heart. It provides your doctor with information about the size and shape of your heart and how well your heart's chambers and valves are working. This procedure takes approximately one hour. There are no restrictions for this procedure.    Follow-Up: At [facility], you and your health needs are our priority.  As part of our continuing mission to provide you with exceptional heart care, we have created designated Provider Care Teams.  These Care Teams include your primary Cardiologist (physician) and Advanced Practice Providers (APPs -  Physician Assistants and Nurse Practitioners) who all work together to provide you with the care you need, when you need it.  We recommend signing up for the patient portal called "MyChart".  Sign up information is provided on this After Visit Summary.  MyChart is used to connect with patients for Virtual Visits (Telemedicine).  Patients are able to view lab/test results, encounter notes, upcoming appointments, etc.  Non-urgent messages can be sent to your provider as well.   To learn more about what you  can do with MyChart, go to [email or web address].    Your next appointment:   8 month(s)  The format for your next appointment:   In Person  Provider:   Melina Copa, PA-C, Ermalinda Barrios, PA-C, or Christen Bame, NP     Then, Werner Lean, MD will plan to see you again in 1 year(s).    Important Information About Sugar         Signed, Werner Lean, MD  09/11/2022 8:17 AM    [facility]

## 2022-09-11 ENCOUNTER — Encounter: Payer: Self-pay | Admitting: Internal Medicine

## 2022-09-11 ENCOUNTER — Ambulatory Visit: Payer: Medicare Other | Attending: Internal Medicine | Admitting: Internal Medicine

## 2022-09-11 VITALS — BP 122/70 | HR 60 | Ht 73.0 in | Wt 303.0 lb

## 2022-09-11 DIAGNOSIS — I2584 Coronary atherosclerosis due to calcified coronary lesion: Secondary | ICD-10-CM

## 2022-09-11 DIAGNOSIS — I2510 Atherosclerotic heart disease of native coronary artery without angina pectoris: Secondary | ICD-10-CM

## 2022-09-11 DIAGNOSIS — I4892 Unspecified atrial flutter: Secondary | ICD-10-CM | POA: Diagnosis not present

## 2022-09-11 DIAGNOSIS — E7800 Pure hypercholesterolemia, unspecified: Secondary | ICD-10-CM

## 2022-09-11 DIAGNOSIS — I10 Essential (primary) hypertension: Secondary | ICD-10-CM

## 2022-09-11 DIAGNOSIS — I700 Atherosclerosis of aorta: Secondary | ICD-10-CM | POA: Diagnosis not present

## 2022-09-11 DIAGNOSIS — Z905 Acquired absence of kidney: Secondary | ICD-10-CM

## 2022-09-11 DIAGNOSIS — J449 Chronic obstructive pulmonary disease, unspecified: Secondary | ICD-10-CM

## 2022-09-11 DIAGNOSIS — N1831 Chronic kidney disease, stage 3a: Secondary | ICD-10-CM

## 2022-09-11 NOTE — Patient Instructions (Signed)
Medication Instructions:  Your physician recommends that you continue on your current medications as directed. Please refer to the Current Medication list given to you today.  *If you need a refill on your cardiac medications before your next appointment, please call your pharmacy*   Lab Work: NONE If you have labs (blood work) drawn today and your tests are completely normal, you will receive your results only by: [place] (if you have MyChart) OR A paper copy in the mail If you have any lab test that is abnormal or we need to change your treatment, we will call you to review the results.   Testing/Procedures: APRIL 2024: Your physician has requested that you have an echocardiogram. Echocardiography is a painless test that uses sound waves to create images of your heart. It provides your doctor with information about the size and shape of your heart and how well your heart's chambers and valves are working. This procedure takes approximately one hour. There are no restrictions for this procedure.    Follow-Up: At [facility], you and your health needs are our priority.  As part of our continuing mission to provide you with exceptional heart care, we have created designated Provider Care Teams.  These Care Teams include your primary Cardiologist (physician) and Advanced Practice Providers (APPs -  Physician Assistants and Nurse Practitioners) who all work together to provide you with the care you need, when you need it.  We recommend signing up for the patient portal called "MyChart".  Sign up information is provided on this After Visit Summary.  MyChart is used to connect with patients for Virtual Visits (Telemedicine).  Patients are able to view lab/test results, encounter notes, upcoming appointments, etc.  Non-urgent messages can be sent to your provider as well.   To learn more about what you can do with MyChart, go to [email or web address].    Your next appointment:    8 month(s)  The format for your next appointment:   In Person  Provider:   Melina Copa, PA-C, Ermalinda Barrios, PA-C, or Christen Bame, NP     Then, Werner Lean, MD will plan to see you again in 1 year(s).    Important Information About Sugar

## 2022-09-14 ENCOUNTER — Inpatient Hospital Stay (HOSPITAL_COMMUNITY): Admission: RE | Admit: 2022-09-14 | Payer: Medicare Other | Source: Ambulatory Visit

## 2022-09-20 ENCOUNTER — Ambulatory Visit (HOSPITAL_COMMUNITY)
Admission: RE | Admit: 2022-09-20 | Discharge: 2022-09-20 | Disposition: A | Discharge status: Home / Self Care | Payer: Medicare Other | Source: Ambulatory Visit | Attending: Internal Medicine | Admitting: Internal Medicine

## 2022-09-20 ENCOUNTER — Encounter: Payer: Self-pay | Admitting: Internal Medicine

## 2022-09-20 DIAGNOSIS — N1831 Chronic kidney disease, stage 3a: Secondary | ICD-10-CM | POA: Insufficient documentation

## 2022-09-20 DIAGNOSIS — E7800 Pure hypercholesterolemia, unspecified: Secondary | ICD-10-CM | POA: Insufficient documentation

## 2022-09-20 DIAGNOSIS — R9431 Abnormal electrocardiogram [ECG] [EKG]: Secondary | ICD-10-CM | POA: Insufficient documentation

## 2022-09-20 DIAGNOSIS — I10 Essential (primary) hypertension: Secondary | ICD-10-CM | POA: Insufficient documentation

## 2022-10-31 DIAGNOSIS — B351 Tinea unguium: Secondary | ICD-10-CM | POA: Diagnosis not present

## 2022-10-31 DIAGNOSIS — M79674 Pain in right toe(s): Secondary | ICD-10-CM | POA: Diagnosis not present

## 2022-10-31 DIAGNOSIS — L600 Ingrowing nail: Secondary | ICD-10-CM | POA: Diagnosis not present

## 2022-10-31 DIAGNOSIS — M79675 Pain in left toe(s): Secondary | ICD-10-CM | POA: Diagnosis not present

## 2022-11-28 ENCOUNTER — Ambulatory Visit (HOSPITAL_COMMUNITY)
Admission: RE | Admit: 2022-11-28 | Discharge: 2022-11-28 | Disposition: A | Discharge status: Home / Self Care | Payer: Medicare Other | Source: Ambulatory Visit | Attending: Acute Care | Admitting: Acute Care

## 2022-11-28 DIAGNOSIS — J432 Centrilobular emphysema: Secondary | ICD-10-CM | POA: Insufficient documentation

## 2022-11-28 DIAGNOSIS — Z122 Encounter for screening for malignant neoplasm of respiratory organs: Secondary | ICD-10-CM | POA: Insufficient documentation

## 2022-11-28 DIAGNOSIS — I700 Atherosclerosis of aorta: Secondary | ICD-10-CM | POA: Diagnosis not present

## 2022-11-28 DIAGNOSIS — Z87891 Personal history of nicotine dependence: Secondary | ICD-10-CM

## 2022-11-30 ENCOUNTER — Telehealth: Payer: Self-pay | Admitting: Acute Care

## 2022-11-30 ENCOUNTER — Other Ambulatory Visit: Payer: Self-pay

## 2022-11-30 DIAGNOSIS — Z87891 Personal history of nicotine dependence: Secondary | ICD-10-CM

## 2022-11-30 DIAGNOSIS — Z122 Encounter for screening for malignant neoplasm of respiratory organs: Secondary | ICD-10-CM

## 2022-11-30 NOTE — Telephone Encounter (Signed)
I have called the patient with the results of his low dose CT Chest. I explained that his scan was read as a Lung RADS 2: nodules that are benign in appearance and behavior with a very low likelihood of becoming a clinically active cancer due to size or lack of growth. Recommendation per radiology is for a repeat LDCT in 12 months.  I told him I have reviewed his scan and the nodule of concern in the right upper lobe continues to  get slightly bigger over time, and that since 2016 it has increased in size by 2 mm. This is slow growth, but with his history of renal cancer in 2017, I did offer him the Nodify blood test to help [place] to risk stratify him. He is interested in having this done, and I will place the order . He wants  to have the labs drawn here in the office if possible. I will check with Jinny Blossom to see if this is something we can do. After his results are complete, we will set up a video visit to review results.  Langley Gauss, for now we will do a 12 month follow up , but this may change after the blood test. Please fax results to PCP. Thanks so much

## 2022-11-30 NOTE — Telephone Encounter (Signed)
Results faxed to PCP and new order placed for 2024 annual LDCT

## 2022-12-01 ENCOUNTER — Telehealth: Payer: Self-pay | Admitting: Acute Care

## 2022-12-01 DIAGNOSIS — R911 Solitary pulmonary nodule: Secondary | ICD-10-CM

## 2022-12-01 NOTE — Telephone Encounter (Signed)
Patient risk stratification pre assessment came back at 53%, Henry Ellis does not qualify for Nodify blood test  as his pre test risk of malignancy was greater than 50%.  I have discussed this with Dr. Valeta Harms, and we will PET scan this RUL nodule that has been slowly growing for several years. With patient's previous history of renal cancer in  2017 ( Kidney removed in 2017)  , we will PET scan him to better evaluate this for a slow growing  carcinoma. I have called the patient to explain the above. He verbalized understanding that he does not qualify for the blood test, and that we will do a PET scan instead. I explained that he will get a call to get this scheduled. Langley Gauss, please place on tickle list to watch for PET results, I will also place within the order , the need for OV with me after PET has been completed. Thanks so much

## 2022-12-04 NOTE — Telephone Encounter (Signed)
See other telephone note from 12/01/2022

## 2022-12-04 NOTE — Telephone Encounter (Signed)
OV has been scheduled 12/26/22 to review PET results with Eric Form, NP

## 2022-12-20 ENCOUNTER — Ambulatory Visit (HOSPITAL_COMMUNITY)
Admission: RE | Admit: 2022-12-20 | Discharge: 2022-12-20 | Disposition: A | Discharge status: Home / Self Care | Payer: Medicare Other | Source: Ambulatory Visit | Attending: Acute Care | Admitting: Acute Care

## 2022-12-20 DIAGNOSIS — R918 Other nonspecific abnormal finding of lung field: Secondary | ICD-10-CM | POA: Diagnosis not present

## 2022-12-20 DIAGNOSIS — R911 Solitary pulmonary nodule: Secondary | ICD-10-CM | POA: Diagnosis not present

## 2022-12-20 LAB — GLUCOSE, CAPILLARY: Glucose-Capillary: 99 mg/dL (ref 70–99)

## 2022-12-20 MED ORDER — FLUDEOXYGLUCOSE F - 18 (FDG) INJECTION
15.1000 | Freq: Once | INTRAVENOUS | Status: AC | PRN
  Administered 2022-12-20: 15.1 via INTRAVENOUS

## 2022-12-26 ENCOUNTER — Ambulatory Visit: Payer: Medicare Other | Admitting: Acute Care

## 2022-12-26 ENCOUNTER — Telehealth: Payer: Self-pay | Admitting: Internal Medicine

## 2022-12-26 ENCOUNTER — Encounter: Payer: Self-pay | Admitting: Acute Care

## 2022-12-26 VITALS — BP 142/78 | HR 60 | Temp 97.8°F | Ht 73.0 in | Wt 304.0 lb

## 2022-12-26 DIAGNOSIS — R911 Solitary pulmonary nodule: Secondary | ICD-10-CM | POA: Diagnosis not present

## 2022-12-26 DIAGNOSIS — Z87891 Personal history of nicotine dependence: Secondary | ICD-10-CM | POA: Diagnosis not present

## 2022-12-26 DIAGNOSIS — K6289 Other specified diseases of anus and rectum: Secondary | ICD-10-CM

## 2022-12-26 NOTE — Patient Instructions (Addendum)
It is good to see you today. The lung nodule we were concerned about does not show any hypermetabolic activity, which is great. We will do a follow up LDCT in 12 months. There was an incidental finding of an area in your anorectal junction that does have increased metabolic activity, and is concerning for malignancy. I have done an urgent referral to GI, Dr. Nash Shearer or first available provider for follow up. You should get a call from their office to schedule an appointment. If you have not heard from them tomorrow , call and see if they can get you scheduled.  Follow up as needed  Please contact office for sooner follow up if symptoms do not improve or worsen or seek emergency care

## 2022-12-26 NOTE — Progress Notes (Signed)
History of Present Illness Henry Ellis is a 67 y.o. male former smoker ( Quit 2017 with a 35+ pack year smoking history)with a history of left renal cell cancer diagnosed in 2017( incidental finding on lung cancer screening) , and pulmonary nodules. He is followed through the lung screening program.    12/26/2022 Pt. Presents for follow up. He had a slowly growing lung nodule. It had grown from 7 mm to 11 mm since 2016. While his scan was read as a Lung RADS 2: nodules that are benign in appearance and behavior with a very low likelihood of becoming a clinically active cancer Recommendation per radiology was for a repeat LDCT in 12 months, due to the slow growth, we determined it was best to PET scan the nodule to ensure it was not a slow growing neoplasm.  PET scan was completed 12/20/2022. SUV max was 1.74. There were no enlarged or hypermetabolic mediastinal hilar lymph nodes. No chest wall masses, supraclavicular or axillary adenopathy.  There was an incidental finding, however,  of marked hypermetabolism in the anorectal junction., with  moderate soft tissue thickening. SUV max is 18.22 on PET imaging. Findings are  highly worrisome for anal cancer.  Last colonoscopy 5 years ago . Patient had renal cancer that was also an incidental finding on a Lung Cancer Screening Scan. I explained we needed to get him to GI asap for evaluation , colonoscopy and biopsy. He is in agreement with this plan. He is followed by Dr. Nash Shearer, but is willing to see first available provider to expedite care.  He has no breathing complaints today. He feels well, and has not had any weight loss.   Test Results: PET Scan 12/21/2022 CHEST: 11 mm smoothly marginated well circumscribed right upper lobe pulmonary nodule does not demonstrate any hypermetabolism. SUV max is 1.74. Looking back at has slowly enlarged mass 2016 where it measured 7 mm. No enlarged or hypermetabolic mediastinal hilar lymph nodes. No chest wall  masses, supraclavicular or axillary adenopathy.   Incidental CT findings: Stable mild bilateral gynecomastia. Small pericardial fluid. Scattered aortic calcifications.   ABDOMEN/PELVIS: No abnormal hypermetabolic activity within the liver, pancreas, adrenal glands, or spleen. No hypermetabolic lymph nodes in the abdomen or pelvis.   Marked hypermetabolism is noted in the main is to the anorectal junction. There appears to be moderate soft tissue thickening. SUV max is 18.22. Findings highly worrisome for anal cancer. Recommend correlation with physical examination and biopsy is indicated. No mesorectal, ischiorectal fossa or inguinal adenopathy.   Incidental CT findings: Status post left nephrectomy. Stable vascular calcifications. Stable hepatic cysts. Prostate gland enlargement.   SKELETON: No focal hypermetabolic activity to suggest skeletal metastasis.   Incidental CT findings: None.   IMPRESSION: 1. 11 mm right upper lobe pulmonary nodule does not show any hypermetabolism. It has slowly increased in size since 2016. Most likely benign. Could not totally exclude the possibility of a well-differentiated carcinoid tumor. Recommend continued observation versus bronchoscopic biopsy. 2. No evidence of metastatic disease. 3. Diffuse lower rectal and anal soft tissue thickening and marked hypermetabolism worrisome for neoplasm. Recommend correlation with physical examination and biopsy as indicated.      Latest Ref Rng & Units 03/31/2022    7:09 AM 02/20/2022    7:45 AM 08/19/2021    8:48 AM  CBC  WBC 3.4 - 10.8 x10E3/uL 5.2  5.6  6.4   Hemoglobin 13.0 - 17.7 g/dL 12.8  13.4  12.8   Hematocrit 37.5 -  51.0 % 38.9  40.3  38.4   Platelets 150 - 450 x10E3/uL 194  216.0  218.0        Latest Ref Rng & Units 08/23/2022    7:46 AM 02/20/2022    7:45 AM 08/19/2021    8:48 AM  BMP  Glucose 70 - 99 mg/dL 127  88  79   BUN 6 - 23 mg/dL '23  30  24   '$ Creatinine 0.40 - 1.50 mg/dL 1.49   1.54  1.71   Sodium 135 - 145 mEq/L 137  138  138   Potassium 3.5 - 5.1 mEq/L 4.2  4.8  5.0   Chloride 96 - 112 mEq/L 102  101  101   CO2 19 - 32 mEq/L '28  28  31   '$ Calcium 8.4 - 10.5 mg/dL 9.1  9.3  9.1    9.2     BNP    Component Value Date/Time   BNP 96 08/20/2020 1107    ProBNP No results found for: "PROBNP"  PFT No results found for: "FEV1PRE", "FEV1POST", "FVCPRE", "FVCPOST", "TLC", "DLCOUNC", "PREFEV1FVCRT", "PSTFEV1FVCRT"  NM PET Image Initial (PI) Skull Base To Thigh (F-18 FDG)  Result Date: 12/21/2022 CLINICAL DATA:  Initial treatment strategy for pulmonary nodule. EXAM: NUCLEAR MEDICINE PET SKULL BASE TO THIGH TECHNIQUE: 15.0 mCi F-18 FDG was injected intravenously. Full-ring PET imaging was performed from the skull base to thigh after the radiotracer. CT data was obtained and used for attenuation correction and anatomic localization. Fasting blood glucose: 90 mg/dl COMPARISON:  None Available. FINDINGS: Mediastinal blood pool activity: SUV max 3.05 Liver activity: SUV max NA NECK: No hypermetabolic lymph nodes in the neck. Incidental CT findings: None. CHEST: 11 mm smoothly marginated well circumscribed right upper lobe pulmonary nodule does not demonstrate any hypermetabolism. SUV max is 1.74. Looking back at has slowly enlarged mass 2016 where it measured 7 mm. No enlarged or hypermetabolic mediastinal hilar lymph nodes. No chest wall masses, supraclavicular or axillary adenopathy. Incidental CT findings: Stable mild bilateral gynecomastia. Small pericardial fluid. Scattered aortic calcifications. ABDOMEN/PELVIS: No abnormal hypermetabolic activity within the liver, pancreas, adrenal glands, or spleen. No hypermetabolic lymph nodes in the abdomen or pelvis. Marked hypermetabolism is noted in the main is to the anorectal junction. There appears to be moderate soft tissue thickening. SUV max is 18.22. Findings highly worrisome for anal cancer. Recommend correlation with physical  examination and biopsy is indicated. No mesorectal, ischiorectal fossa or inguinal adenopathy. Incidental CT findings: Status post left nephrectomy. Stable vascular calcifications. Stable hepatic cysts. Prostate gland enlargement. SKELETON: No focal hypermetabolic activity to suggest skeletal metastasis. Incidental CT findings: None. IMPRESSION: 1. 11 mm right upper lobe pulmonary nodule does not show any hypermetabolism. It has slowly increased in size since 2016. Most likely benign. Could not totally exclude the possibility of a well-differentiated carcinoid tumor. Recommend continued observation versus bronchoscopic biopsy. 2. No evidence of metastatic disease. 3. Diffuse lower rectal and anal soft tissue thickening and marked hypermetabolism worrisome for neoplasm. Recommend correlation with physical examination and biopsy as indicated. Electronically Signed   By: Marijo Sanes M.D.   On: 12/21/2022 12:37   CT CHEST LUNG CA SCREEN LOW DOSE W/O CM  Result Date: 11/30/2022 CLINICAL DATA:  67 year old male former smoker (quit in 2017) with greater than 20 pack-year history of smoking. Lung cancer screening examination. EXAM: CT CHEST WITHOUT CONTRAST LOW-DOSE FOR LUNG CANCER SCREENING TECHNIQUE: Multidetector CT imaging of the chest was performed following the standard protocol without  IV contrast. RADIATION DOSE REDUCTION: This exam was performed according to the departmental dose-optimization program which includes automated exposure control, adjustment of the mA and/or kV according to patient size and/or use of iterative reconstruction technique. COMPARISON:  Low-dose lung cancer screening chest CT 11/28/2021. FINDINGS: Cardiovascular: Heart size is normal. There is no significant pericardial fluid, thickening or pericardial calcification. Aortic atherosclerosis. No definite coronary artery calcifications. Mediastinum/Nodes: No pathologically enlarged mediastinal or hilar lymph nodes. Please note that  accurate exclusion of hilar adenopathy is limited on noncontrast CT scans. Esophagus is unremarkable in appearance. No axillary lymphadenopathy. Lungs/Pleura: Multiple small pulmonary nodules are again noted in the lungs bilaterally, largest of which is in the central aspect of the right upper lobe (axial image 132) with a volume derived mean diameter of 12.6 mm, similar to the prior examinations. No larger more suspicious appearing pulmonary nodules or masses are noted. No acute consolidative airspace disease. No pleural effusions. Mild diffuse bronchial wall thickening with very mild centrilobular and paraseptal emphysema. Upper Abdomen: 3 cm low-attenuation lesion in segment 8 of the liver, incompletely characterized on today's noncontrast CT examination, but statistically likely to represent a cyst (no imaging follow-up recommended). Musculoskeletal: There are no aggressive appearing lytic or blastic lesions noted in the visualized portions of the skeleton. IMPRESSION: 1. Lung-RADS 2, benign appearance or behavior. Continue annual screening with low-dose chest CT without contrast in 12 months. 2. Aortic atherosclerosis. 3. Mild diffuse bronchial wall thickening with very mild centrilobular and paraseptal emphysema; imaging findings suggestive of underlying COPD. Aortic Atherosclerosis (ICD10-I70.0) and Emphysema (ICD10-J43.9). Electronically Signed   By: Vinnie Langton M.D.   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   Chronic bronchitis    Chronic kidney disease 2017   2017-had left kidney removed   ED (erectile dysfunction)    Esophageal reflux    on meds   Esophageal stricture    Essential hypertension 10/19/2015   on  meds   H/O adenomatous polyp of colon 06/05/2012   Hiatal hernia    High cholesterol    on meds   History of Bell's palsy    IBS (irritable bowel syndrome)    Personal history of colonic polyps 04/15/2008 & 04/22/2012   TUBULAR ADENOMA   Ulcerative esophagitis      Social History   Tobacco Use   Smoking status: Former    Packs/day: 1.00    Types: Cigarettes    Quit date: 10/17/2016    Years since quitting: 6.1   Smokeless tobacco: Never   Tobacco comments:    Counseling sheet given 03-2012   Vaping Use   Vaping Use: Former  Substance Use Topics   Alcohol use: Yes    Alcohol/week: 3.0 standard drinks of alcohol    Types: 3 Shots of liquor per week   Drug use: Not Currently    Frequency: 2.0 times per week    Types: Marijuana    Comment: last use June 05, 2016    Mr.Tanori reports that he quit smoking about 6 years ago. His smoking use included cigarettes. He smoked an average of 1 pack per day. He  has never used smokeless tobacco. He reports current alcohol use of about 3.0 standard drinks of alcohol per week. He reports that he does not currently use drugs after having used the following drugs: Marijuana. Frequency: 2.00 times per week.  Tobacco Cessation: Former smoker, Quit 2017 with a 35+ pack year smoking history  Past surgical hx, Family hx, Social hx all reviewed.  Current Outpatient Medications on File Prior to Visit  Medication Sig   amLODipine (NORVASC) 2.5 MG tablet Take 1 tablet (2.5 mg total) by mouth daily.   Cholecalciferol (VITAMIN D3 PO) Take by mouth.   dapagliflozin propanediol (FARXIGA) 5 MG TABS tablet Take 1 tablet (5 mg total) by mouth daily before breakfast.   docusate sodium (COLACE) 100 MG capsule Take 100 mg by mouth daily.   esomeprazole (NEXIUM) 20 MG capsule Take 1 capsule (20 mg total) by mouth daily at 12 noon.   ferrous sulfate 325 (65 FE) MG tablet Take 325 mg by mouth daily with breakfast.   finasteride (PROSCAR) 5 MG tablet Take 5 mg by  mouth daily.   Psyllium 30.9 % POWD Take 1 packet by mouth daily.   rivaroxaban (XARELTO) 20 MG TABS tablet Take 1 tablet (20 mg total) by mouth daily with supper.   solifenacin (VESICARE) 5 MG tablet Take 1 tablet (5 mg total) by mouth daily.   atorvastatin (LIPITOR) 40 MG tablet Take 1 tablet (40 mg total) by mouth daily.   No current facility-administered medications on file prior to visit.     Allergies  Allergen Reactions   Bee Venom Anaphylaxis   Amoxicillin Rash    Did PCN reaction causing immediate rash, facial/tongue/throat swelling, SOB or lightheadedness with hypotension: no Did PCN reaction causing severe rash involving mucus membranes or skin necrosis: no Did PCN reaction that required hospitalization : no Did pcn reaction occurring within the last 10 years: no If all of the above answers are "NO", then may proceed with Cephalosporin use. Pt states he can take low doses of amoxicillin, has taken for dental procedures without reaction   Apixaban Itching   Aspirin Nausea Only   Other Rash    Aspirin cream     Review Of Systems:  Constitutional:   No  weight loss, night sweats,  Fevers, chills, fatigue, or  lassitude.  HEENT:   No headaches,  Difficulty swallowing,  Tooth/dental problems, or  Sore throat,                No sneezing, itching, ear ache, nasal congestion, post nasal drip,   CV:  No chest pain,  Orthopnea, PND, swelling in lower extremities, anasarca, dizziness, palpitations, syncope.   GI  No heartburn, indigestion, abdominal pain, nausea, vomiting, diarrhea, change in bowel habits, loss of appetite, bloody stools.   Resp: No shortness of breath with exertion or at rest.  No excess mucus, no productive cough,  No non-productive cough,  No coughing up of blood.  No change in color of mucus.  No wheezing.  No chest wall deformity  Skin: no rash or lesions.  GU: no dysuria, change in color of urine, no urgency or frequency.  No flank pain, no hematuria    MS:  No joint pain or swelling.  No decreased range of motion.  No back pain.  Psych:  No change in mood or affect. No depression or anxiety.  No memory loss.   Vital Signs BP (!) 142/78 (BP Location: Left Arm, Patient Position: Sitting, Cuff Size: Normal)  Pulse 60   Temp 97.8 F (36.6 C) (Oral)   Ht '6\' 1"'$  (1.854 m)   Wt (!) 304 lb (137.9 kg)   SpO2 99%   BMI 40.11 kg/m    Physical Exam:  General- No distress,  A&Ox3, pleasant ENT: No sinus tenderness, TM clear, pale nasal mucosa, no oral exudate,no post nasal drip, no LAN Cardiac: S1, S2, regular rate and rhythm, no murmur Chest: No wheeze/ rales/ dullness; no accessory muscle use, no nasal flaring, no sternal retractions Abd.: Soft Non-tender, ND, BS + Ext: No clubbing cyanosis, edema Neuro:  normal strength, MAE x 4, A&O x 3, appropriate Skin: No rashes, warm and dry, no lesions  Psych: normal mood and behavior   Assessment/Plan  Slowly growing 11 mm ( has grown from 7 mm -11 mm since 2017) smoothly marginated well circumscribed right upper lobe pulmonary nodule, not hypermetabolic on PET 19/5093 Plan Follow up Low Dose CT Chest in 12 months.  Incidental Finding of  Diffuse lower rectal and anal soft tissue thickening and marked hypermetabolism worrisome for neoplasm. Last colonoscopy 5 years ago Plan I have placed  an urgent referral to GI, Dr. Nash Shearer or first available provider for follow up. You should get a call from their office to schedule an appointment. You will need colonoscopy with biopsy to better evaluate this area.  If you have not heard from them tomorrow , call and see if they can get you scheduled.  Follow up as needed  Please contact office for sooner follow up if symptoms do not improve or worsen or seek emergency care     I spent 40 minutes dedicated to the care of this patient on the date of this encounter to include pre-visit review of records, face-to-face time with the patient discussing  conditions above, post visit ordering of testing, clinical documentation with the electronic health record, making appropriate referrals as documented, and communicating necessary information to the patient's healthcare team.    Magdalen Spatz, NP 12/26/2022  9:08 AM

## 2022-12-26 NOTE — Telephone Encounter (Signed)
Urgent referral in [facility].  Patient had a PET scan to evaluate a pulmonary nodule. Pulmonary Nodule was not hypermetabolic. However there was an incidental finding of a hypermetabolic area in the anorectal junction. Possible rectal mass.  Patient last saw Dr. Hilarie Fredrickson in 2021.  Please advise scheduling?

## 2022-12-27 NOTE — Telephone Encounter (Signed)
Pt scheduled to see Tye Savoy NP 12/28/22 at 10am. Please let pt know about the appt.

## 2022-12-27 NOTE — Telephone Encounter (Signed)
Patient was made aware of appointment. I advised him that it had just been made and we had not contacted him. Patient stated he will be here for his appointment.

## 2022-12-27 NOTE — Telephone Encounter (Signed)
He was not aware because it was just scheduled and sent to [facility] to notify him of the appt. Can he keep this appt?

## 2022-12-27 NOTE — Telephone Encounter (Signed)
Patient called stating he needed to make an appointment. I advised patient of the appointment with Nevin Bloodgood for tomorrow at 10:00. Patient stated he was not aware.

## 2022-12-28 ENCOUNTER — Encounter: Payer: Self-pay | Admitting: Nurse Practitioner

## 2022-12-28 ENCOUNTER — Ambulatory Visit: Payer: Medicare Other | Admitting: Nurse Practitioner

## 2022-12-28 ENCOUNTER — Telehealth: Payer: Self-pay

## 2022-12-28 VITALS — BP 140/78 | HR 71 | Ht 73.0 in | Wt 306.0 lb

## 2022-12-28 DIAGNOSIS — Z7901 Long term (current) use of anticoagulants: Secondary | ICD-10-CM

## 2022-12-28 DIAGNOSIS — R948 Abnormal results of function studies of other organs and systems: Secondary | ICD-10-CM | POA: Diagnosis not present

## 2022-12-28 DIAGNOSIS — Z86010 Personal history of colonic polyps: Secondary | ICD-10-CM | POA: Diagnosis not present

## 2022-12-28 MED ORDER — NA SULFATE-K SULFATE-MG SULF 17.5-3.13-1.6 GM/177ML PO SOLN: 1.0000 | Freq: Once | ORAL | 0 refills | Status: AC

## 2022-12-28 NOTE — Telephone Encounter (Signed)
[facility] Pre-operative Risk Assessment     Request for surgical clearance:     Endoscopy Procedure  What type of surgery is being performed?     Colonoscopy  When is this surgery scheduled?     01-23-2023  What type of clearance is required ?   Pharmacy  Are there any medications that need to be held prior to surgery and how long? Xarelto 2 day hold  Practice name and name of physician performing surgery?      [facility]  What is your office phone and fax number?      Phone- [phone]  [phone]  Anesthesia type (None, local, MAC, general) ?       MAC

## 2022-12-28 NOTE — Progress Notes (Signed)
Addendum: Reviewed and agree with assessment and management plan. Eugenie Harewood M, MD  

## 2022-12-28 NOTE — Telephone Encounter (Signed)
[place], MD   Preoperative team, please contact this patient and set up a phone call appointment for further preoperative risk assessment. Please obtain consent and complete medication review. Thank you for your help.   I confirm that guidance regarding antiplatelet and oral anticoagulation therapy has been completed and, if necessary, noted below.   Emmaline Life, NP-C  12/28/2022, 1:42 PM [address], [address] Office [phone] Fax [phone]

## 2022-12-28 NOTE — Telephone Encounter (Signed)
Patient with diagnosis of atrial fibrillation on Xarelto for anticoagulation.    Procedure: colonoscopy Date of procedure: 01-23-2023   CHA2DS2-VASc Score = 2   This indicates a 2.2% annual risk of stroke. The patient's score is based upon: CHF History: 0 HTN History: 1 Diabetes History: 0 Stroke History: 0 Vascular Disease History: 0 Age Score: 1 Gender Score: 0   CrCl 93 Platelet count 194  Per office protocol, patient can hold xarelto for 2 days prior to procedure.   Patient will not need bridging with Lovenox (enoxaparin) around procedure.  **This guidance is not considered finalized until pre-operative APP has relayed final recommendations.**

## 2022-12-28 NOTE — Progress Notes (Signed)
Assessment    Patient profile:  Henry Ellis is a 67 y.o. year old male, known to Dr. Hilarie Fredrickson, with a past medical history of GERD, hiatal hernia, colon polyps, left renal cell cancer, lung nodule being followed by pulmonary, CKD, Aflutter on Xarelto, HTN . See PMH / [place] for additional history. Referred by PCP for abnormal PET scan  # Anorectal wall thickening with marked hypermetabolism on PET scan done for evaluation of pulmonary nodule. No bowel changes, rectal pain or rectal bleeding. Rule out neoplasm vrs proctitis.   # History of colon polyps. Due for recommended 3 year surveillance colonoscopy until October 2024   # Aflutter, on Xarelto. CHA2DS2-VASc Score = 2  }   # CKD3. Has one kidney  # Hx of left RCC s/p nephrectomy. Found incidentally on imaging for [facility] cancer screening.   # Chronic normocytic anemia. May be secondary to CKD  Plan:    Schedule for colonoscopy.to evaluate anorectal findings on PET scan and also history of colon polyps. The risks and benefits of colonoscopy with possible polypectomy / biopsies were discussed and the patient agrees to proceed. Suprep okay. Cr 1.49 / GFR 48 Hold Xarelto for 2 days before procedure - will instruct when and how to resume after procedure. Patient understands that there is a low but real risk of cardiovascular event such as heart attack, stroke, or embolism /  thrombosis while off blood thinner. The patient consents to proceed. Will communicate by phone or EMR with patient's prescribing provider to confirm that holding Xarelto is reasonable in this case.   HPI:    Chief Complaint: abnormal PET scan  Henry Ellis was last seen at time of polyp surveillance colonoscopy in 2021.  He has an enlarging lung nodule being followed by pulmonary.  Recent PET scan showed an 11 mm right upper lobe pulmonary nodule which does not show any hypermetabolism. It has slowly increased in size since 2016. Most likely benign. Could not totally exclude  the possibility of a well-differentiated carcinoid tumor. No evidence of metastatic disease.   PET scan also showed diffuse lower rectal and anal soft tissue thickening and marked hypermetabolism worrisome for neoplasm.   Patient has no Gi symptoms. Gets diarrhea sometimes after eating at certain restaurants but that is not new. No blood in stool. No rectal pain.    Previous Labs / Imaging::    Latest Ref Rng & Units 03/31/2022    7:09 AM 02/20/2022    7:45 AM 08/19/2021    8:48 AM  CBC  WBC 3.4 - 10.8 x10E3/uL 5.2  5.6  6.4   Hemoglobin 13.0 - 17.7 g/dL 12.8  13.4  12.8   Hematocrit 37.5 - 51.0 % 38.9  40.3  38.4   Platelets 150 - 450 x10E3/uL 194  216.0  218.0     No results found for: "LIPASE"    Latest Ref Rng & Units 08/23/2022    7:46 AM 02/20/2022    7:45 AM 08/19/2021    8:48 AM  CMP  Glucose 70 - 99 mg/dL 127  88  79   BUN 6 - 23 mg/dL '23  30  24   '$ Creatinine 0.40 - 1.50 mg/dL 1.49  1.54  1.71   Sodium 135 - 145 mEq/L 137  138  138   Potassium 3.5 - 5.1 mEq/L 4.2  4.8  5.0   Chloride 96 - 112 mEq/L 102  101  101   CO2 19 - 32 mEq/L 28  28  31   Calcium 8.4 - 10.5 mg/dL 9.1  9.3  9.1    9.2   Total Protein 6.0 - 8.3 g/dL 7.1  6.9  6.8   Total Bilirubin 0.2 - 1.2 mg/dL 0.6  0.6  0.6   Alkaline Phos 39 - 117 U/L 83  71  88   AST 0 - 37 U/L '14  11  11   '$ ALT 0 - 53 U/L '15  9  9     '$ Previous GI Evaluation  Most recent colonoscopy Oct 2021 - One 4 mm transverse colon polyp removed. Two 3 to 4 mmo polyps removed in the descending colon. A 3 mm polyp removed from the distal rectum.  Diverticulosis.  Hemorrhoids Surgical [P], colon, transverse, descending, polyps (3) - TUBULAR ADENOMA (THREE). - NO HIGH GRADE DYSPLASIA OR CARCINOMA. 2. Surgical [P], colon, rectal, polyp (1) - HYPERPLASTIC POLYP (ONE). - NO ADENOMATOUS CHANGE OR CARCINOMA  Path- 3 TAs. A 3 year colonoscopy was recommended for Oct 2024  Imaging:  NM PET Image Initial (PI) Skull Base To Thigh (F-18  FDG) CLINICAL DATA:  Initial treatment strategy for pulmonary nodule.  EXAM: NUCLEAR MEDICINE PET SKULL BASE TO THIGH  TECHNIQUE: 15.0 mCi F-18 FDG was injected intravenously. Full-ring PET imaging was performed from the skull base to thigh after the radiotracer. CT data was obtained and used for attenuation correction and anatomic localization.  Fasting blood glucose: 90 mg/dl  COMPARISON:  None Available.  FINDINGS: Mediastinal blood pool activity: SUV max 3.05  Liver activity: SUV max NA  NECK: No hypermetabolic lymph nodes in the neck.  Incidental CT findings: None.  CHEST: 11 mm smoothly marginated well circumscribed right upper lobe pulmonary nodule does not demonstrate any hypermetabolism. SUV max is 1.74. Looking back at has slowly enlarged mass 2016 where it measured 7 mm. No enlarged or hypermetabolic mediastinal hilar lymph nodes. No chest wall masses, supraclavicular or axillary adenopathy.  Incidental CT findings: Stable mild bilateral gynecomastia. Small pericardial fluid. Scattered aortic calcifications.  ABDOMEN/PELVIS: No abnormal hypermetabolic activity within the liver, pancreas, adrenal glands, or spleen. No hypermetabolic lymph nodes in the abdomen or pelvis.  Marked hypermetabolism is noted in the main is to the anorectal junction. There appears to be moderate soft tissue thickening. SUV max is 18.22. Findings highly worrisome for anal cancer. Recommend correlation with physical examination and biopsy is indicated. No mesorectal, ischiorectal fossa or inguinal adenopathy.  Incidental CT findings: Status post left nephrectomy. Stable vascular calcifications. Stable hepatic cysts. Prostate gland enlargement.  SKELETON: No focal hypermetabolic activity to suggest skeletal metastasis.  Incidental CT findings: None.  IMPRESSION: 1. 11 mm right upper lobe pulmonary nodule does not show any hypermetabolism. It has slowly increased in size since  2016. Most likely benign. Could not totally exclude the possibility of a well-differentiated carcinoid tumor. Recommend continued observation versus bronchoscopic biopsy. 2. No evidence of metastatic disease. 3. Diffuse lower rectal and anal soft tissue thickening and marked hypermetabolism worrisome for neoplasm. Recommend correlation with physical examination and biopsy as indicated.  Electronically Signed   By: Marijo Sanes M.D.   On: 12/21/2022 12:37   2023: Echo with normal LV function, mild LA dilation  Past Medical History:  Diagnosis Date   Allergy    seasonal allergies   Anemia    on meds   Anxiety    hx of   Arthritis    generalized   Barrett's esophagus    Cancer ([place]) 10/17/2016  kidney cancer(removed left kidney in 2017)   Carpal tunnel syndrome, right 04/21/2011   Chronic bronchitis    Chronic kidney disease 2017   2017-had left kidney removed   ED (erectile dysfunction)    Esophageal reflux    on meds   Esophageal stricture    Essential hypertension 10/19/2015   on meds   H/O adenomatous polyp of colon 06/05/2012   Hiatal hernia    High cholesterol    on meds   History of Bell's palsy    IBS (irritable bowel syndrome)    Personal history of colonic polyps 04/15/2008 & 04/22/2012   TUBULAR ADENOMA   Ulcerative esophagitis    Past Surgical History:  Procedure Laterality Date   CARPAL TUNNEL RELEASE     left   INGUINAL HERNIA REPAIR     bilateral   KNEE ARTHROSCOPY     right [name]   ROBOT ASSISTED LAPAROSCOPIC NEPHRECTOMY Left 10/18/2016   Procedure: XI ROBOTIC ASSISTED LAPAROSCOPIC RADICAL NEPHRECTOMY;  Surgeon: Alexis Frock, MD;  Location: [facility];  Service: Urology;  Laterality: Left;   WISDOM TOOTH EXTRACTION     Family History  Problem Relation Age of Onset   Heart disease Father    Lung cancer Father    Hypertension Brother        THROUGHOUT FAMILY   Colon cancer Neg Hx    Stomach cancer Neg Hx    Esophageal cancer Neg Hx     Rectal cancer Neg Hx    Colon polyps Neg Hx    Social History   Tobacco Use   Smoking status: Former    Packs/day: 1.00    Types: Cigarettes    Quit date: 10/17/2016    Years since quitting: 6.2   Smokeless tobacco: Never   Tobacco comments:    Counseling sheet given 03-2012   Vaping Use   Vaping Use: Former  Substance Use Topics   Alcohol use: Yes    Alcohol/week: 3.0 standard drinks of alcohol    Types: 3 Shots of liquor per week   Drug use: Not Currently    Frequency: 2.0 times per week    Types: Marijuana    Comment: last use June 05, 2016   Current Outpatient Medications  Medication Sig Dispense Refill   amLODipine (NORVASC) 2.5 MG tablet Take 1 tablet (2.5 mg total) by mouth daily. 90 tablet 3   atorvastatin (LIPITOR) 40 MG tablet Take 1 tablet (40 mg total) by mouth daily. 90 tablet 3   Cholecalciferol (VITAMIN D3 PO) Take by mouth.     dapagliflozin propanediol (FARXIGA) 5 MG TABS tablet Take 1 tablet (5 mg total) by mouth daily before breakfast. 90 tablet 3   docusate sodium (COLACE) 100 MG capsule Take 100 mg by mouth daily.     esomeprazole (NEXIUM) 20 MG capsule Take 1 capsule (20 mg total) by mouth daily at 12 noon. 90 capsule 3   ferrous sulfate 325 (65 FE) MG tablet Take 325 mg by mouth daily with breakfast.     finasteride (PROSCAR) 5 MG tablet Take 5 mg by mouth daily.     Psyllium 30.9 % POWD Take 1 packet by mouth daily.     rivaroxaban (XARELTO) 20 MG TABS tablet Take 1 tablet (20 mg total) by mouth daily with supper. 90 tablet 3   solifenacin (VESICARE) 5 MG tablet Take 1 tablet (5 mg total) by mouth daily. 90 tablet 3   No current facility-administered medications for this visit.  Allergies  Allergen Reactions   Bee Venom Anaphylaxis   Amoxicillin Rash    Did PCN reaction causing immediate rash, facial/tongue/throat swelling, SOB or lightheadedness with hypotension: no Did PCN reaction causing severe rash involving mucus membranes or skin necrosis:  no Did PCN reaction that required hospitalization : no Did pcn reaction occurring within the last 10 years: no If all of the above answers are "NO", then may proceed with Cephalosporin use. Pt states he can take low doses of amoxicillin, has taken for dental procedures without reaction   Apixaban Itching   Aspirin Nausea Only   Other Rash    Aspirin cream     Review of Systems: Positive for chronic shortness of breath. No chest pain. No urinary problems. .   Wt Readings from Last 3 Encounters:  12/26/22 (!) 304 lb (137.9 kg)  09/11/22 (!) 303 lb (137.4 kg)  08/28/22 299 lb (135.6 kg)    Physical Exam   BP (!) 140/78   Pulse 71   Ht '6\' 1"'$  (1.854 m)   Wt (!) 306 lb (138.8 kg)   SpO2 97%   BMI 40.37 kg/m  Constitutional:  Generally well appearing  male in no acute distress. Psychiatric: Pleasant. Normal mood and affect. Behavior is normal. EENT: Pupils normal.  Conjunctivae are normal. No scleral icterus. Neck supple.  Cardiovascular: Normal rate, regular rhythm.  Pulmonary/chest: Effort normal and breath sounds normal. No wheezing, rales or rhonchi. Abdominal: Soft, nondistended, nontender. Bowel sounds active throughout. There are no masses palpable. No hepatomegaly. Neurological: Alert and oriented to person place and time. Extremities: No edema Skin: Skin is warm and dry. No rashes noted.  Tye Savoy, NP  12/28/2022, 8:28 AM  Cc:  Referring Provider Biagio Borg, MD

## 2022-12-28 NOTE — Patient Instructions (Addendum)
_______________________________________________________  If you are age 69 or older, your body mass index should be between 23-30. Your Body mass index is 40.37 kg/m. If this is out of the aforementioned range listed, please consider follow up with your Primary Care Provider.  If you are age 77 or younger, your body mass index should be between 19-25. Your Body mass index is 40.37 kg/m. If this is out of the aformentioned range listed, please consider follow up with your Primary Care Provider.   ________________________________________________________  The [facility] providers would like to encourage you to use [facility] to communicate with providers for non-urgent requests or questions.  Due to long hold times on the telephone, sending your provider a message by [facility] may be a faster and more efficient way to get a response.  Please allow 48 business hours for a response.  Please remember that this is for non-urgent requests.  _______________________________________________________  Dennis Bast have been scheduled for a colonoscopy. Please follow written instructions given to you at your visit today.  Please pick up your prep supplies at the pharmacy within the next 1-3 days. If you use inhalers (even only as needed), please bring them with you on the day of your procedure.  We have sent the following medications to your pharmacy for you to pick up at your convenience: Suprep  Hold iron tablet 10 days prior to the procedure  You will be contacted by our office prior to your procedure for directions on holding your Xarelto.  If you do not hear from our office 2 week prior to your scheduled procedure, please call [phone] to discuss.   Please call with any questions or concerns.  It was a pleasure to see you today!  Thank you for trusting me with your gastrointestinal care!

## 2022-12-29 ENCOUNTER — Other Ambulatory Visit: Payer: Medicare Other | Admitting: Internal Medicine

## 2022-12-29 ENCOUNTER — Telehealth: Payer: Self-pay | Admitting: *Deleted

## 2022-12-29 ENCOUNTER — Other Ambulatory Visit: Payer: Self-pay | Admitting: *Deleted

## 2022-12-29 DIAGNOSIS — I700 Atherosclerosis of aorta: Secondary | ICD-10-CM

## 2022-12-29 DIAGNOSIS — I2510 Atherosclerotic heart disease of native coronary artery without angina pectoris: Secondary | ICD-10-CM

## 2022-12-29 DIAGNOSIS — E7800 Pure hypercholesterolemia, unspecified: Secondary | ICD-10-CM

## 2022-12-29 MED ORDER — ATORVASTATIN CALCIUM 40 MG PO TABS: 40.0000 mg | ORAL_TABLET | Freq: Every day | ORAL | 3 refills | Status: DC

## 2022-12-29 NOTE — Telephone Encounter (Signed)
Pt agreeable to plan of care for tele pre op appt 01/15/23 @ 9:20. Med rec and consent are done.    Pt said he needs a refill on his Lipitor. I sent in  refill for his Lipitor.

## 2022-12-29 NOTE — Telephone Encounter (Signed)
Pt agreeable to plan of care for tele pre op appt 01/15/23 @ 9:20. Med rec and consent are done.   Pt said he needs a refill on his Lipitor. I sent in  refill for his Lipitor.      Patient Consent for Virtual Visit        Henry Ellis has provided verbal consent on 12/29/2022 for a virtual visit (video or telephone).   CONSENT FOR VIRTUAL VISIT FOR:  Henry Ellis  By participating in this virtual visit I agree to the following:  I hereby voluntarily request, consent and authorize [facility] and its employed or contracted physicians, physician assistants, nurse practitioners or other licensed health care professionals (the Practitioner), to provide me with telemedicine health care services (the "Services") as deemed necessary by the treating Practitioner. I acknowledge and consent to receive the Services by the Practitioner via telemedicine. I understand that the telemedicine visit will involve communicating with the Practitioner through live audiovisual communication technology and the disclosure of certain medical information by electronic transmission. I acknowledge that I have been given the opportunity to request an in-person assessment or other available alternative prior to the telemedicine visit and am voluntarily participating in the telemedicine visit.  I understand that I have the right to withhold or withdraw my consent to the use of telemedicine in the course of my care at any time, without affecting my right to future care or treatment, and that the Practitioner or I may terminate the telemedicine visit at any time. I understand that I have the right to inspect all information obtained and/or recorded in the course of the telemedicine visit and may receive copies of available information for a reasonable fee.  I understand that some of the potential risks of receiving the Services via telemedicine include:  Delay or interruption in medical evaluation due to  technological equipment failure or disruption; Information transmitted may not be sufficient (e.g. poor resolution of images) to allow for appropriate medical decision making by the Practitioner; and/or  In rare instances, security protocols could fail, causing a breach of personal health information.  Furthermore, I acknowledge that it is my responsibility to provide information about my medical history, conditions and care that is complete and accurate to the best of my ability. I acknowledge that Practitioner's advice, recommendations, and/or decision may be based on factors not within their control, such as incomplete or inaccurate data provided by me or distortions of diagnostic images or specimens that may result from electronic transmissions. I understand that the practice of medicine is not an exact science and that Practitioner makes no warranties or guarantees regarding treatment outcomes. I acknowledge that a copy of this consent can be made available to me via my patient portal ([facility]), or I can request a printed copy by calling the office of [facility].    I understand that my insurance will be billed for this visit.   I have read or had this consent read to me. I understand the contents of this consent, which adequately explains the benefits and risks of the Services being provided via telemedicine.  I have been provided ample opportunity to ask questions regarding this consent and the Services and have had my questions answered to my satisfaction. I give my informed consent for the services to be provided through the use of telemedicine in my medical care

## 2023-01-02 DIAGNOSIS — N183 Chronic kidney disease, stage 3 unspecified: Secondary | ICD-10-CM | POA: Diagnosis not present

## 2023-01-08 DIAGNOSIS — K080 Exfoliation of teeth due to systemic causes: Secondary | ICD-10-CM | POA: Diagnosis not present

## 2023-01-08 NOTE — Progress Notes (Signed)
Virtual Visit via Telephone Note   Because of Henry Ellis's co-morbid illnesses, he is at least at moderate risk for complications without adequate follow up.  This format is felt to be most appropriate for this patient at this time.  The patient did not have access to video technology/had technical difficulties with video requiring transitioning to audio format only (telephone).  All issues noted in this document were discussed and addressed.  No physical exam could be performed with this format.  Please refer to the patient's chart for his consent to telehealth for [facility].  Evaluation Performed:  Preoperative cardiovascular risk assessment _____________   Date:  01/08/2023   Patient ID:  Henry Ellis, Henry Ellis 03-11-1956, MRN [id number] Patient Location:  Home Provider location:   Office  Primary Care Provider:  Biagio Borg, MD Primary Cardiologist:  Werner Lean, MD  Chief Complaint / Patient Profile   67 y.o. y/o male with a h/o atrial flutter on chronic anticoagulation, CKD with history of nephrectomy, HTN, HLD, aortic atherosclerosis, former tobacco abuse, morbid obesity who is pending colonoscopy and presents today for telephonic preoperative cardiovascular risk assessment.  History of Present Illness    Henry Ellis is a 67 y.o. male who presents via audio/video conferencing for a telehealth visit today.  Pt was last seen in cardiology clinic on 09/11/22 by Dr. Gasper Sells.  At that time [name] was doing well.  The patient is now pending procedure as outlined above. Since his last visit, he denies chest pain, lower extremity edema, fatigue, palpitations, melena, hematuria, hemoptysis, diaphoresis, weakness, presyncope, syncope, orthopnea, and PND. He reports chronic shortness of breath that he feels is stable. He walks for exercise almost daily for 30-60 minutes and is not having any cardiac symptoms with exertion.   Past Medical  History    Past Medical History:  Diagnosis Date   Allergy    seasonal allergies   Anemia    on meds   Anxiety    hx of   Arthritis    generalized   Barrett's esophagus    Cancer (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/2011   Chronic bronchitis    Chronic kidney disease 2017   2017-had left kidney removed   ED (erectile dysfunction)    Esophageal reflux    on meds   Esophageal stricture    Essential hypertension 10/19/2015   on meds   H/O adenomatous polyp of colon 06/05/2012   Hiatal hernia    High cholesterol    on meds   History of Bell's palsy    IBS (irritable bowel syndrome)    Personal history of colonic polyps 04/15/2008 & 04/22/2012   TUBULAR ADENOMA   Ulcerative esophagitis    Past Surgical History:  Procedure Laterality Date   CARPAL TUNNEL RELEASE     left   INGUINAL HERNIA REPAIR     bilateral   KNEE ARTHROSCOPY     right [name]   ROBOT ASSISTED LAPAROSCOPIC NEPHRECTOMY Left 10/18/2016   Procedure: XI ROBOTIC ASSISTED LAPAROSCOPIC RADICAL NEPHRECTOMY;  Surgeon: Alexis Frock, MD;  Location: [facility];  Service: Urology;  Laterality: Left;   WISDOM TOOTH EXTRACTION      Allergies  Allergies  Allergen Reactions   Bee Venom Anaphylaxis   Amoxicillin Rash    Did PCN reaction causing immediate rash, facial/tongue/throat swelling, SOB or lightheadedness with hypotension: no Did PCN reaction causing  severe rash involving mucus membranes or skin necrosis: no Did PCN reaction that required hospitalization : no Did pcn reaction occurring within the last 10 years: no If all of the above answers are "NO", then may proceed with Cephalosporin use. Pt states he can take low doses of amoxicillin, has taken for dental procedures without reaction   Apixaban Itching   Aspirin Nausea Only   Other Rash    Aspirin cream     Home Medications    Prior to Admission medications   Medication Sig Start Date  End Date Taking? Authorizing Provider  amLODipine (NORVASC) 2.5 MG tablet Take 1 tablet (2.5 mg total) by mouth daily. 02/24/22   Biagio Borg, MD  atorvastatin (LIPITOR) 40 MG tablet Take 1 tablet (40 mg total) by mouth daily. 12/29/22 12/24/23  Werner Lean, MD  Cholecalciferol (VITAMIN D3 PO) Take by mouth.    [provider]  dapagliflozin propanediol (FARXIGA) 5 MG TABS tablet Take 1 tablet (5 mg total) by mouth daily before breakfast. 02/24/22   Biagio Borg, MD  docusate sodium (COLACE) 100 MG capsule Take 100 mg by mouth daily.    [provider]  esomeprazole (NEXIUM) 20 MG capsule Take 1 capsule (20 mg total) by mouth daily at 12 noon. 02/24/22   Biagio Borg, MD  ferrous sulfate 325 (65 FE) MG tablet Take 325 mg by mouth daily with breakfast.    [provider]  finasteride (PROSCAR) 5 MG tablet Take 5 mg by mouth daily.    [provider]  Psyllium 30.9 % POWD Take 1 packet by mouth daily.    [provider]  rivaroxaban (XARELTO) 20 MG TABS tablet Take 1 tablet (20 mg total) by mouth daily with supper. 03/28/22   Chandrasekhar, Lyda Kalata A, MD  solifenacin (VESICARE) 5 MG tablet Take 1 tablet (5 mg total) by mouth daily. 02/24/22   Biagio Borg, MD    Physical Exam    Vital Signs:  [name] does not have vital signs available for review today.  Given telephonic nature of communication, physical exam is limited. AAOx3. NAD. Normal affect.  Speech and respirations are unlabored.  Accessory Clinical Findings    None  Assessment & Plan    1.  Preoperative Cardiovascular Risk Assessment: The patient is doing well from a cardiac perspective. Therefore, based on ACC/AHA guidelines, the patient would be at acceptable risk for the planned procedure without further cardiovascular testing. According to the Revised Cardiac Risk Index (RCRI), his Perioperative Risk of Major Cardiac Event is (%): 0.4 His Functional Capacity in METs  is: 5.38 according to the [place] Activity Status Index (DASI).  The patient was advised that if he develops new symptoms prior to surgery to contact our office to arrange for a follow-up visit, and he verbalized understanding.  Per office protocol, patient can hold Xarelto for 2 days prior to procedure.   Patient will not need bridging with Lovenox (enoxaparin) around procedure.  A copy of this note will be routed to requesting surgeon.  Time:   Today, I have spent 10 minutes with the patient with telehealth technology discussing medical history, symptoms, and management plan.    Emmaline Life, NP-C  01/15/2023, 9:12 AM [address], [address] Office [phone] Fax [phone]

## 2023-01-09 DIAGNOSIS — I129 Hypertensive chronic kidney disease with stage 1 through stage 4 chronic kidney disease, or unspecified chronic kidney disease: Secondary | ICD-10-CM | POA: Diagnosis not present

## 2023-01-09 DIAGNOSIS — E1122 Type 2 diabetes mellitus with diabetic chronic kidney disease: Secondary | ICD-10-CM | POA: Diagnosis not present

## 2023-01-09 DIAGNOSIS — N183 Chronic kidney disease, stage 3 unspecified: Secondary | ICD-10-CM | POA: Diagnosis not present

## 2023-01-09 DIAGNOSIS — I4891 Unspecified atrial fibrillation: Secondary | ICD-10-CM | POA: Diagnosis not present

## 2023-01-15 ENCOUNTER — Encounter: Payer: Self-pay | Admitting: Internal Medicine

## 2023-01-15 ENCOUNTER — Ambulatory Visit: Payer: Medicare Other | Attending: Cardiology | Admitting: Nurse Practitioner

## 2023-01-15 ENCOUNTER — Encounter: Payer: Self-pay | Admitting: Nurse Practitioner

## 2023-01-15 DIAGNOSIS — Z01810 Encounter for preprocedural cardiovascular examination: Secondary | ICD-10-CM

## 2023-01-16 NOTE — Telephone Encounter (Signed)
Patient has been advised

## 2023-01-23 ENCOUNTER — Encounter: Payer: Self-pay | Admitting: Internal Medicine

## 2023-01-23 ENCOUNTER — Ambulatory Visit (AMBULATORY_SURGERY_CENTER): Payer: Medicare Other | Admitting: Internal Medicine

## 2023-01-23 VITALS — BP 132/87 | HR 55 | Temp 96.3°F | Resp 11 | Ht 73.0 in | Wt 306.0 lb

## 2023-01-23 DIAGNOSIS — Z09 Encounter for follow-up examination after completed treatment for conditions other than malignant neoplasm: Secondary | ICD-10-CM

## 2023-01-23 DIAGNOSIS — R933 Abnormal findings on diagnostic imaging of other parts of digestive tract: Secondary | ICD-10-CM

## 2023-01-23 DIAGNOSIS — D123 Benign neoplasm of transverse colon: Secondary | ICD-10-CM

## 2023-01-23 DIAGNOSIS — K6289 Other specified diseases of anus and rectum: Secondary | ICD-10-CM | POA: Diagnosis not present

## 2023-01-23 DIAGNOSIS — K635 Polyp of colon: Secondary | ICD-10-CM

## 2023-01-23 DIAGNOSIS — R948 Abnormal results of function studies of other organs and systems: Secondary | ICD-10-CM

## 2023-01-23 DIAGNOSIS — Z86010 Personal history of colonic polyps: Secondary | ICD-10-CM

## 2023-01-23 DIAGNOSIS — K629 Disease of anus and rectum, unspecified: Secondary | ICD-10-CM | POA: Diagnosis not present

## 2023-01-23 DIAGNOSIS — K644 Residual hemorrhoidal skin tags: Secondary | ICD-10-CM

## 2023-01-23 MED ORDER — SODIUM CHLORIDE 0.9 % IV SOLN: 500.0000 mL | INTRAVENOUS | Status: DC

## 2023-01-23 NOTE — Progress Notes (Signed)
Called to room to assist during endoscopic procedure.  Patient ID and intended procedure confirmed with present staff. Received instructions for my participation in the procedure from the performing physician.  

## 2023-01-23 NOTE — Progress Notes (Signed)
Pt's states no medical or surgical changes since previsit or office visit. 

## 2023-01-23 NOTE — Progress Notes (Signed)
To pacu, VSS. Report to Rn.tb 

## 2023-01-23 NOTE — Patient Instructions (Signed)
   [place] ( 01/24/23)  Handout on polyps given to you today  AWAIT PATHOLOGY RESULTS OF POLYPS REMOVED AND OF BIOPSIES DONE     YOU HAD AN ENDOSCOPIC PROCEDURE TODAY AT [place]:   Refer to the procedure report that was given to you for any specific questions about what was found during the examination.  If the procedure report does not answer your questions, please call your gastroenterologist to clarify.  If you requested that your care partner not be given the details of your procedure findings, then the procedure report has been included in a sealed envelope for you to review at your convenience later.  YOU SHOULD EXPECT: Some feelings of bloating in the abdomen. Passage of more gas than usual.  Walking can help get rid of the air that was put into your GI tract during the procedure and reduce the bloating. If you had a lower endoscopy (such as a colonoscopy or flexible sigmoidoscopy) you may notice spotting of blood in your stool or on the toilet paper. If you underwent a bowel prep for your procedure, you may not have a normal bowel movement for a few days.  Please Note:  You might notice some irritation and congestion in your nose or some drainage.  This is from the oxygen used during your procedure.  There is no need for concern and it should clear up in a day or so.  SYMPTOMS TO REPORT IMMEDIATELY:  Following lower endoscopy (colonoscopy or flexible sigmoidoscopy):  Excessive amounts of blood in the stool  Significant tenderness or worsening of abdominal pains  Swelling of the abdomen that is new, acute  Fever of 100F or higher   For urgent or emergent issues, a gastroenterologist can be reached at any hour by calling [phone]. Do not use MyChart messaging for urgent concerns.    DIET:  We do recommend a small meal at first, but then you may proceed to your regular diet.  Drink plenty of fluids but you should avoid  alcoholic beverages for 24 hours.  ACTIVITY:  You should plan to take it easy for the rest of today and you should NOT DRIVE or use heavy machinery until tomorrow (because of the sedation medicines used during the test).    FOLLOW UP: Our staff will call the number listed on your records the next business day following your procedure.  We will call around 7:15- 8:00 am to check on you and address any questions or concerns that you may have regarding the information given to you following your procedure. If we do not reach you, we will leave a message.     If any biopsies were taken you will be contacted by phone or by letter within the next 1-3 weeks.  Please call us at [phone] if you have not heard about the biopsies in 3 weeks.    SIGNATURES/CONFIDENTIALITY: You and/or your care partner have signed paperwork which will be entered into your electronic medical record.  These signatures attest to the fact that that the information above on your After Visit Summary has been reviewed and is understood.  Full responsibility of the confidentiality of this discharge information lies with you and/or your care-partner.

## 2023-01-23 NOTE — Progress Notes (Signed)
See office note dated 12/28/2022 for details and current H&P  Patient presenting for colonoscopy to evaluate abnormal PET scan revealing anorectal wall thickening  Xarelto on hold x 2 days  Last colonoscopy October 21 with 4 polyps removed 3 of which were adenomatous, 1 hyperplastic.  He remains appropriate for [facility] colonoscopy today.

## 2023-01-23 NOTE — Op Note (Signed)
[place] Patient Name: Henry Ellis Procedure Date: 01/23/2023 2:01 PM MRN: [id number] Endoscopist: Jerene Bears , MD, [id number] Age: 67 Referring MD:  Date of Birth: [birth year] Gender: Male Account #: [id number] Procedure:                Colonoscopy Indications:              Abnormal PET scan of the GI tract (anorectal wall                            thickening); personal hx of adenomatous colon                            polyps (last colonoscopy Oct 2021) Medicines:                Monitored Anesthesia Care Procedure:                Pre-Anesthesia Assessment:                           - Prior to the procedure, a History and Physical                            was performed, and patient medications and                            allergies were reviewed. The patient's tolerance of                            previous anesthesia was also reviewed. The risks                            and benefits of the procedure and the sedation                            options and risks were discussed with the patient.                            All questions were answered, and informed consent                            was obtained. Prior Anticoagulants: The patient has                            taken Xarelto (rivaroxaban), last dose was 2 days                            prior to procedure. ASA Grade Assessment: III - A                            patient with severe systemic disease. After                            reviewing the risks and benefits, the patient was  deemed in satisfactory condition to undergo the                            procedure.                           After obtaining informed consent, the colonoscope                            was passed under direct vision. Throughout the                            procedure, the patient's blood pressure, pulse, and                            oxygen saturations were monitored continuously. The                             Olympus SN [id number] was introduced through the anus                            and advanced to the cecum, identified by                            appendiceal orifice and ileocecal valve. The                            colonoscopy was performed without difficulty. The                            patient tolerated the procedure well. The quality                            of the bowel preparation was good. The ileocecal                            valve, appendiceal orifice, and rectum were                            photographed. Scope In: 2:09:57 PM Scope Out: 2:26:33 PM Scope Withdrawal Time: 0 hours 14 minutes 52 seconds  Total Procedure Duration: 0 hours 16 minutes 36 seconds  Findings:                 Skin tags were found on perianal exam.                           A 4 mm polyp was found in the hepatic flexure. The                            polyp was sessile. The polyp was removed with a                            cold snare. Resection and retrieval were complete.  Two sessile polyps were found in the transverse                            colon. The polyps were 3 to 5 mm in size. These                            polyps were removed with a cold snare. Resection                            and retrieval were complete.                           A few small-mouthed diverticula were found in the                            sigmoid colon.                           A 5 mm linear lesion with central umbilication was                            found in the distal rectum and extending to dentate                            line (query healing fissure, rule out neoplasia).                            No bleeding was present. Mucosa was biopsied with a                            cold forceps for histology. The tissue did not feel                            overly firm on DRE.                           Internal hemorrhoids were found during                             retroflexion. The hemorrhoids were small. Complications:            No immediate complications. Estimated Blood Loss:     Estimated blood loss was minimal. Impression:               - Perianal skin tags found on perianal exam.                           - One 4 mm polyp at the hepatic flexure, removed                            with a cold snare. Resected and retrieved.                           - Two 3 to 5 mm polyps  in the transverse colon,                            removed with a cold snare. Resected and retrieved.                           - Diverticulosis in the sigmoid colon.                           - Linear mucosal irregularity in the distal rectum                            at dentate line. Biopsied.                           - Small internal hemorrhoids. Recommendation:           - Patient has a contact number available for                            emergencies. The signs and symptoms of potential                            delayed complications were discussed with the                            patient. Return to normal activities tomorrow.                            Written discharge instructions were provided to the                            patient.                           - Resume previous diet.                           - Continue present medications.                           - Await pathology results.                           - Repeat colonoscopy is recommended for                            surveillance. The colonoscopy date will be                            determined after pathology results from today's                            exam become available for review.                           - Resume Xarelto (rivaroxaban) at prior dose  tomorrow. Refer to managing physician for further                            adjustment of therapy. Jerene Bears, MD 01/23/2023 2:33:52 PM This report has been signed electronically.

## 2023-01-24 ENCOUNTER — Telehealth: Payer: Self-pay

## 2023-01-24 NOTE — Telephone Encounter (Signed)
  Follow up Call-     01/23/2023    1:19 PM 09/23/2020   10:44 AM  Call back number  Post procedure Call Back phone  # [phone]  Permission to leave phone message Yes Yes     Patient questions:  Do you have a fever, pain , or abdominal swelling? No. Pain Score  0 *  Have you tolerated food without any problems? Yes.    Have you been able to return to your normal activities? Yes.    Do you have any questions about your discharge instructions: Diet   No. Medications  No. Follow up visit  No.  Do you have questions or concerns about your Care? No.  Actions: * If pain score is 4 or above: No action needed, pain <4.

## 2023-01-30 ENCOUNTER — Encounter: Payer: Self-pay | Admitting: Internal Medicine

## 2023-01-30 DIAGNOSIS — L84 Corns and callosities: Secondary | ICD-10-CM | POA: Diagnosis not present

## 2023-01-30 DIAGNOSIS — I739 Peripheral vascular disease, unspecified: Secondary | ICD-10-CM | POA: Diagnosis not present

## 2023-01-30 DIAGNOSIS — L603 Nail dystrophy: Secondary | ICD-10-CM | POA: Diagnosis not present

## 2023-02-22 ENCOUNTER — Other Ambulatory Visit (INDEPENDENT_AMBULATORY_CARE_PROVIDER_SITE_OTHER): Payer: Medicare Other

## 2023-02-22 DIAGNOSIS — E559 Vitamin D deficiency, unspecified: Secondary | ICD-10-CM

## 2023-02-22 DIAGNOSIS — E538 Deficiency of other specified B group vitamins: Secondary | ICD-10-CM

## 2023-02-22 DIAGNOSIS — Z125 Encounter for screening for malignant neoplasm of prostate: Secondary | ICD-10-CM

## 2023-02-22 DIAGNOSIS — R7302 Impaired glucose tolerance (oral): Secondary | ICD-10-CM | POA: Diagnosis not present

## 2023-02-22 DIAGNOSIS — E7800 Pure hypercholesterolemia, unspecified: Secondary | ICD-10-CM

## 2023-02-22 LAB — HEPATIC FUNCTION PANEL
ALT: 12 U/L (ref 0–53)
AST: 12 U/L (ref 0–37)
Albumin: 3.7 g/dL (ref 3.5–5.2)
Alkaline Phosphatase: 73 U/L (ref 39–117)
Bilirubin, Direct: 0.2 mg/dL (ref 0.0–0.3)
Total Bilirubin: 0.8 mg/dL (ref 0.2–1.2)
Total Protein: 6.5 g/dL (ref 6.0–8.3)

## 2023-02-22 LAB — LIPID PANEL
Cholesterol: 126 mg/dL (ref 0–200)
HDL: 56.5 mg/dL (ref >39.00)
LDL Cholesterol: 52 mg/dL (ref 0–99)
NonHDL: 69.57
Total CHOL/HDL Ratio: 2
Triglycerides: 88 mg/dL (ref 0.0–149.0)
VLDL: 17.6 mg/dL (ref 0.0–40.0)

## 2023-02-22 LAB — URINALYSIS, ROUTINE W REFLEX MICROSCOPIC
Bilirubin Urine: NEGATIVE
Hgb urine dipstick: NEGATIVE
Ketones, ur: NEGATIVE
Leukocytes,Ua: NEGATIVE
Nitrite: NEGATIVE
RBC / HPF: NONE SEEN (ref 0–?)
Specific Gravity, Urine: 1.01 (ref 1.000–1.030)
Total Protein, Urine: NEGATIVE
Urine Glucose: >=1000 — AB
Urobilinogen, UA: 0.2 (ref 0.0–1.0)
pH: 6 (ref 5.0–8.0)

## 2023-02-22 LAB — CBC WITH DIFFERENTIAL/PLATELET
Basophils Absolute: 0.1 10*3/uL (ref 0.0–0.1)
Basophils Relative: 1.1 % (ref 0.0–3.0)
Eosinophils Absolute: 0.1 10*3/uL (ref 0.0–0.7)
Eosinophils Relative: 1.8 % (ref 0.0–5.0)
HCT: 37 % — ABNORMAL LOW (ref 39.0–52.0)
Hemoglobin: 12.4 g/dL — ABNORMAL LOW (ref 13.0–17.0)
Lymphocytes Relative: 18.7 % (ref 12.0–46.0)
Lymphs Abs: 0.9 10*3/uL (ref 0.7–4.0)
MCHC: 33.4 g/dL (ref 30.0–36.0)
MCV: 97.6 fl (ref 78.0–100.0)
Monocytes Absolute: 0.5 10*3/uL (ref 0.1–1.0)
Monocytes Relative: 9.6 % (ref 3.0–12.0)
Neutro Abs: 3.5 10*3/uL (ref 1.4–7.7)
Neutrophils Relative %: 68.8 % (ref 43.0–77.0)
Platelets: 193 10*3/uL (ref 150.0–400.0)
RBC: 3.8 Mil/uL — ABNORMAL LOW (ref 4.22–5.81)
RDW: 17.3 % — ABNORMAL HIGH (ref 11.5–15.5)
WBC: 5 10*3/uL (ref 4.0–10.5)

## 2023-02-22 LAB — BASIC METABOLIC PANEL
BUN: 21 mg/dL (ref 6–23)
CO2: 28 mEq/L (ref 19–32)
Calcium: 8.7 mg/dL (ref 8.4–10.5)
Chloride: 104 mEq/L (ref 96–112)
Creatinine, Ser: 1.49 mg/dL (ref 0.40–1.50)
GFR: 47.85 mL/min — ABNORMAL LOW (ref >60.00)
Glucose, Bld: 85 mg/dL (ref 70–99)
Potassium: 4.3 mEq/L (ref 3.5–5.1)
Sodium: 139 mEq/L (ref 135–145)

## 2023-02-22 LAB — MICROALBUMIN / CREATININE URINE RATIO
Creatinine,U: 59.4 mg/dL
Microalb Creat Ratio: 5.3 mg/g (ref 0.0–30.0)
Microalb, Ur: 3.1 mg/dL — ABNORMAL HIGH (ref 0.0–1.9)

## 2023-02-22 LAB — HEMOGLOBIN A1C: Hgb A1c MFr Bld: 6 % (ref 4.6–6.5)

## 2023-02-22 LAB — VITAMIN B12: Vitamin B-12: 186 pg/mL — ABNORMAL LOW (ref 211–911)

## 2023-02-22 LAB — PSA: PSA: 1.31 ng/mL (ref 0.10–4.00)

## 2023-02-22 LAB — VITAMIN D 25 HYDROXY (VIT D DEFICIENCY, FRACTURES): VITD: 39.24 ng/mL (ref 30.00–100.00)

## 2023-02-22 LAB — TSH: TSH: 0.95 u[IU]/mL (ref 0.35–5.50)

## 2023-02-26 ENCOUNTER — Encounter: Payer: Self-pay | Admitting: Internal Medicine

## 2023-02-26 ENCOUNTER — Telehealth: Payer: Self-pay | Admitting: Internal Medicine

## 2023-02-26 ENCOUNTER — Ambulatory Visit (INDEPENDENT_AMBULATORY_CARE_PROVIDER_SITE_OTHER): Payer: Medicare Other | Admitting: Internal Medicine

## 2023-02-26 VITALS — BP 130/78 | HR 60 | Temp 97.9°F | Ht 73.0 in | Wt 315.0 lb

## 2023-02-26 DIAGNOSIS — Z0001 Encounter for general adult medical examination with abnormal findings: Secondary | ICD-10-CM

## 2023-02-26 DIAGNOSIS — I700 Atherosclerosis of aorta: Secondary | ICD-10-CM

## 2023-02-26 DIAGNOSIS — R7302 Impaired glucose tolerance (oral): Secondary | ICD-10-CM

## 2023-02-26 DIAGNOSIS — I10 Essential (primary) hypertension: Secondary | ICD-10-CM | POA: Diagnosis not present

## 2023-02-26 DIAGNOSIS — N1831 Chronic kidney disease, stage 3a: Secondary | ICD-10-CM

## 2023-02-26 DIAGNOSIS — E559 Vitamin D deficiency, unspecified: Secondary | ICD-10-CM

## 2023-02-26 DIAGNOSIS — Z0000 Encounter for general adult medical examination without abnormal findings: Secondary | ICD-10-CM

## 2023-02-26 DIAGNOSIS — E538 Deficiency of other specified B group vitamins: Secondary | ICD-10-CM | POA: Diagnosis not present

## 2023-02-26 DIAGNOSIS — E7800 Pure hypercholesterolemia, unspecified: Secondary | ICD-10-CM

## 2023-02-26 MED ORDER — AMLODIPINE BESYLATE 2.5 MG PO TABS: 2.5000 mg | ORAL_TABLET | Freq: Every day | ORAL | 3 refills | Status: DC

## 2023-02-26 MED ORDER — DAPAGLIFLOZIN PROPANEDIOL 5 MG PO TABS: 5.0000 mg | ORAL_TABLET | Freq: Every day | ORAL | 3 refills | Status: DC

## 2023-02-26 MED ORDER — ESOMEPRAZOLE MAGNESIUM 20 MG PO CPDR: 20.0000 mg | DELAYED_RELEASE_CAPSULE | Freq: Every day | ORAL | 3 refills | Status: DC

## 2023-02-26 NOTE — Progress Notes (Unsigned)
Patient ID: Henry Ellis, male   DOB: 1956/06/25, 67 y.o.   MRN: [id number]         Chief Complaint:: wellness exam and low b12, low Vit d, ckd, hyperglycemia, htn, aortic atherosclerosis       HPI:  Henry Ellis is a 67 y.o. male here for wellness exam; for shingrix at pharmacy, declines covid booster, o/w up to date  Had recent colon polyps at colonoscopy - no cancer                Also Pt denies chest pain, increased sob or doe, wheezing, orthopnea, PND, increased LE swelling, palpitations, dizziness or syncope.   Pt denies polydipsia, polyuria, or new focal neuro s/s.    Pt denies fever, wt loss, night sweats, loss of appetite, or other constitutional symptoms  Not taking B12.  Taking 4000 u vit d. Wt increased recently with being less active.    Wt Readings from Last 3 Encounters:  02/26/23 (!) 315 lb (142.9 kg)  01/23/23 (!) 306 lb (138.8 kg)  12/28/22 (!) 306 lb (138.8 kg)   BP Readings from Last 3 Encounters:  02/26/23 130/78  01/23/23 132/87  12/28/22 (!) 140/78   Immunization History  Administered Date(s) Administered   Fluad Quad(high Dose 65+) 09/16/2020, 08/26/2021, 08/28/2022   Influenza Split 09/27/2012   Influenza Whole 09/07/2008, 12/04/2011   Influenza,inj,Quad PF,6+ Mos 10/03/2013, 10/09/2014, 10/19/2016, 01/25/2018   Influenza-Unspecified 08/30/2018   PFIZER(Purple Top)SARS-COV-2 Vaccination 01/09/2020, 01/30/2020, 01/25/2021   Pneumococcal Conjugate-13 08/01/2019   Pneumococcal Polysaccharide-23 03/05/2009, 10/19/2016, 02/24/2022   Td 03/05/2009   Td (Adult), 2 Lf Tetanus Toxid, Preservative Free 03/05/2009   Tdap 08/01/2019   Zoster, Live 04/16/2015   Health Maintenance Due  Topic Date Due   Zoster Vaccines- Shingrix (1 of 2) Never done      Past Medical History:  Diagnosis Date   Allergy    seasonal allergies   Anemia    on meds   Anxiety    hx of   Arthritis    generalized   Barrett's esophagus    Cancer ([place]) 10/17/2016   kidney  cancer(removed left kidney in 2017)   Carpal tunnel syndrome, right 04/21/2011   Chronic bronchitis    Chronic kidney disease 2017   2017-had left kidney removed   ED (erectile dysfunction)    Esophageal reflux    on meds   Esophageal stricture    Essential hypertension 10/19/2015   on meds   H/O adenomatous polyp of colon 06/05/2012   Hiatal hernia    High cholesterol    on meds   History of Bell's palsy    IBS (irritable bowel syndrome)    Personal history of colonic polyps 04/15/2008 & 04/22/2012   TUBULAR ADENOMA   Ulcerative esophagitis    Past Surgical History:  Procedure Laterality Date   CARPAL TUNNEL RELEASE     left   INGUINAL HERNIA REPAIR     bilateral   KNEE ARTHROSCOPY     right [name]   ROBOT ASSISTED LAPAROSCOPIC NEPHRECTOMY Left 10/18/2016   Procedure: XI ROBOTIC ASSISTED LAPAROSCOPIC RADICAL NEPHRECTOMY;  Surgeon: Alexis Frock, MD;  Location: [facility];  Service: Urology;  Laterality: Left;   WISDOM TOOTH EXTRACTION      reports that he quit smoking about 6 years ago. His smoking use included cigarettes. He smoked an average of 1 pack per day. He has never used smokeless tobacco. He reports current alcohol use of about 3.0 standard  drinks of alcohol per week. He reports that he does not currently use drugs after having used the following drugs: Marijuana. Frequency: 2.00 times per week. family history includes Heart disease in his father; Hypertension in his brother; Lung cancer in his father. Allergies  Allergen Reactions   Bee Venom Anaphylaxis   Amoxicillin Rash    Did PCN reaction causing immediate rash, facial/tongue/throat swelling, SOB or lightheadedness with hypotension: no Did PCN reaction causing severe rash involving mucus membranes or skin necrosis: no Did PCN reaction that required hospitalization : no Did pcn reaction occurring within the last 10 years: no If all of the above answers are "NO", then may proceed with Cephalosporin use. Pt states  he can take low doses of amoxicillin, has taken for dental procedures without reaction   Apixaban Itching   Aspirin Nausea Only   Other Rash    Aspirin cream    Current Outpatient Medications on File Prior to Visit  Medication Sig Dispense Refill   atorvastatin (LIPITOR) 40 MG tablet Take 1 tablet (40 mg total) by mouth daily. 90 tablet 3   Cholecalciferol (VITAMIN D3 PO) Take by mouth.     docusate sodium (COLACE) 100 MG capsule Take 100 mg by mouth daily.     ferrous sulfate 325 (65 FE) MG tablet Take 325 mg by mouth daily with breakfast.     finasteride (PROSCAR) 5 MG tablet Take 5 mg by mouth daily.     Psyllium 30.9 % POWD Take 1 packet by mouth daily.     rivaroxaban (XARELTO) 20 MG TABS tablet Take 1 tablet (20 mg total) by mouth daily with supper. 90 tablet 3   solifenacin (VESICARE) 5 MG tablet Take 1 tablet (5 mg total) by mouth daily. 90 tablet 3   No current facility-administered medications on file prior to visit.        ROS:  All others reviewed and negative.  Objective        PE:  BP 130/78   Pulse 60   Temp 97.9 F (36.6 C) (Oral)   Ht '6\' 1"'$  (1.854 m)   Wt (!) 315 lb (142.9 kg)   SpO2 97%   BMI 41.56 kg/m                 Constitutional: Pt appears in NAD               HENT: Head: NCAT.                Right Ear: External ear normal.                 Left Ear: External ear normal.                Eyes: . Pupils are equal, round, and reactive to light. Conjunctivae and EOM are normal               Nose: without d/c or deformity               Neck: Neck supple. Gross normal ROM               Cardiovascular: Normal rate and regular rhythm.                 Pulmonary/Chest: Effort normal and breath sounds without rales or wheezing.                Abd:  Soft, NT, ND, + BS, no organomegaly  Neurological: Pt is alert. At baseline orientation, motor grossly intact               Skin: Skin is warm. No rashes, no other new lesions, LE edema - trace  bilateral               Psychiatric: Pt behavior is normal without agitation   Micro: none  Cardiac tracings I have personally interpreted today:  none  Pertinent Radiological findings (summarize): none   Lab Results  Component Value Date   WBC 5.0 02/22/2023   HGB 12.4 (L) 02/22/2023   HCT 37.0 (L) 02/22/2023   PLT 193.0 02/22/2023   GLUCOSE 85 02/22/2023   CHOL 126 02/22/2023   TRIG 88.0 02/22/2023   HDL 56.50 02/22/2023   LDLDIRECT 151.7 03/25/2010   LDLCALC 52 02/22/2023   ALT 12 02/22/2023   AST 12 02/22/2023   NA 139 02/22/2023   K 4.3 02/22/2023   CL 104 02/22/2023   CREATININE 1.49 02/22/2023   BUN 21 02/22/2023   CO2 28 02/22/2023   TSH 0.95 02/22/2023   PSA 1.31 02/22/2023   HGBA1C 6.0 02/22/2023   MICROALBUR 3.1 (H) 02/22/2023   Assessment/Plan:  Shelly Raga is a 67 y.o. White or Caucasian [1] male with  has a past medical history of Allergy, Anemia, Anxiety, Arthritis, Barrett's esophagus, Cancer ([place]) (10/17/2016), Carpal tunnel syndrome, right (04/21/2011), Chronic bronchitis, Chronic kidney disease (2017), ED (erectile dysfunction), Esophageal reflux, Esophageal stricture, Essential hypertension (10/19/2015), H/O adenomatous polyp of colon (06/05/2012), Hiatal hernia, High cholesterol, History of Bell's palsy, IBS (irritable bowel syndrome), Personal history of colonic polyps (04/15/2008 & 04/22/2012), and Ulcerative esophagitis.  Encounter for well adult exam with abnormal findings Age and sex appropriate education and counseling updated with regular exercise and diet Referrals for preventative services - none needed Immunizations addressed - declines covid booster, for shingrix at pharmacy Smoking counseling  - none needed Evidence for depression or other mood disorder - none significant Most recent labs reviewed. I have personally reviewed and have noted: 1) the patient's medical and social history 2) The patient's current medications and  supplements 3) The patient's height, weight, and BMI have been recorded in the chart   Aortic atherosclerosis (HCC) Pt to continue liptor, low chol diet, excercise  CKD (chronic kidney disease) Lab Results  Component Value Date   CREATININE 1.49 02/22/2023   Stable overall, cont to avoid nephrotoxins   Essential hypertension BP Readings from Last 3 Encounters:  02/26/23 130/78  01/23/23 132/87  12/28/22 (!) 140/78   Stable, pt to continue medical treatment norvasc 2.5 mg qd   HYPERCHOLESTEROLEMIA Lab Results  Component Value Date   LDLCALC 52 02/22/2023   Stable, pt to continue current statin lipitor 40 mg qd   Impaired glucose tolerance Lab Results  Component Value Date   HGBA1C 6.0 02/22/2023   Stable, pt to continue current medical treatment farxiga 5 mg qd   Vitamin D deficiency Last vitamin D Lab Results  Component Value Date   VD25OH 39.24 02/22/2023   Low, to increase vit d3 to 5000 u qd  B12 deficiency Lab Results  Component Value Date   VITAMINB12 186 (L) 02/22/2023   Low, to start oral replacement - b12 1000 mcg qd  Followup: Return in about 6 months (around 08/29/2023).  Cathlean Cower, MD 02/28/2023 1:00 PM [facility]

## 2023-02-26 NOTE — Telephone Encounter (Signed)
PT visits today post visit and was scheduled for their follow up. PT was advised to have labs prior to their follow up with Dr.John which have been scheduled. However the lab order has not been placed for these upcoming labs.

## 2023-02-26 NOTE — Telephone Encounter (Signed)
Please place lab orders for today's visit

## 2023-02-26 NOTE — Telephone Encounter (Signed)
Patient informed. 

## 2023-02-26 NOTE — Telephone Encounter (Signed)
No further labs needed today as they were done mar 7  I will place the future orders for next time soon

## 2023-02-26 NOTE — Patient Instructions (Signed)
Please take OTC Vitamin D3 at 5000 units per day, indefinitely  Please take all new medication as prescribed - the OTC B12 1000 mcg per day for at least 6 months  Please continue all other medications as before, and refills have been done if requested.  Please have the pharmacy call with any other refills you may need.  Please continue your efforts at being more active, low cholesterol diet, and weight control.  You are otherwise up to date with prevention measures today.  Please keep your appointments with your specialists as you may have planned  Please make an Appointment to return in 6 months, or sooner if needed, also with Lab Appointment for testing done 3-5 days before at the [facility] (so this is for TWO appointments - please see the scheduling desk as you leave)

## 2023-02-28 ENCOUNTER — Encounter: Payer: Self-pay | Admitting: Internal Medicine

## 2023-02-28 DIAGNOSIS — E538 Deficiency of other specified B group vitamins: Secondary | ICD-10-CM | POA: Insufficient documentation

## 2023-02-28 NOTE — Assessment & Plan Note (Signed)
Lab Results  Component Value Date   CREATININE 1.49 02/22/2023   Stable overall, cont to avoid nephrotoxins

## 2023-02-28 NOTE — Assessment & Plan Note (Signed)
Last vitamin D Lab Results  Component Value Date   VD25OH 39.24 02/22/2023   Low, to increase vit d3 to 5000 u qd

## 2023-02-28 NOTE — Assessment & Plan Note (Signed)
Lab Results  Component Value Date   HGBA1C 6.0 02/22/2023   Stable, pt to continue current medical treatment farxiga 5 mg qd

## 2023-02-28 NOTE — Assessment & Plan Note (Signed)
Lab Results  Component Value Date   VITAMINB12 186 (L) 02/22/2023   Low, to start oral replacement - b12 1000 mcg qd

## 2023-02-28 NOTE — Assessment & Plan Note (Signed)
Lab Results  Component Value Date   LDLCALC 52 02/22/2023   Stable, pt to continue current statin lipitor 40 mg qd

## 2023-02-28 NOTE — Assessment & Plan Note (Signed)
Age and sex appropriate education and counseling updated with regular exercise and diet Referrals for preventative services - none needed Immunizations addressed - declines covid booster, for shingrix at pharmacy Smoking counseling  - none needed Evidence for depression or other mood disorder - none significant Most recent labs reviewed. I have personally reviewed and have noted: 1) the patient's medical and social history 2) The patient's current medications and supplements 3) The patient's height, weight, and BMI have been recorded in the chart  

## 2023-02-28 NOTE — Assessment & Plan Note (Signed)
Pt to continue liptor, low chol diet, excercise °

## 2023-02-28 NOTE — Assessment & Plan Note (Signed)
BP Readings from Last 3 Encounters:  02/26/23 130/78  01/23/23 132/87  12/28/22 (!) 140/78   Stable, pt to continue medical treatment norvasc 2.5 mg qd

## 2023-03-19 ENCOUNTER — Other Ambulatory Visit: Payer: Self-pay | Admitting: Internal Medicine

## 2023-03-19 DIAGNOSIS — I4892 Unspecified atrial flutter: Secondary | ICD-10-CM

## 2023-03-19 NOTE — Telephone Encounter (Signed)
Prescription refill request for Xarelto received.  Indication: a flutter Last office visit: 01/15/23 Weight: 315 Age: 67 Scr: 1.49 epic 02/22/23 CrCl: 96 mL/min

## 2023-04-02 ENCOUNTER — Ambulatory Visit (HOSPITAL_COMMUNITY): Payer: Medicare Other | Attending: Internal Medicine

## 2023-04-02 DIAGNOSIS — I4892 Unspecified atrial flutter: Secondary | ICD-10-CM | POA: Diagnosis not present

## 2023-04-02 LAB — ECHOCARDIOGRAM COMPLETE
Area-P 1/2: 3.84 cm2
S' Lateral: 3.7 cm

## 2023-04-02 MED ORDER — PERFLUTREN LIPID MICROSPHERE
1.0000 mL | INTRAVENOUS | Status: AC | PRN
  Administered 2023-04-02: 2 mL via INTRAVENOUS

## 2023-04-05 DIAGNOSIS — H5213 Myopia, bilateral: Secondary | ICD-10-CM | POA: Diagnosis not present

## 2023-04-09 DIAGNOSIS — K080 Exfoliation of teeth due to systemic causes: Secondary | ICD-10-CM | POA: Diagnosis not present

## 2023-04-18 DIAGNOSIS — K080 Exfoliation of teeth due to systemic causes: Secondary | ICD-10-CM | POA: Diagnosis not present

## 2023-05-01 DIAGNOSIS — L603 Nail dystrophy: Secondary | ICD-10-CM | POA: Diagnosis not present

## 2023-05-01 DIAGNOSIS — I739 Peripheral vascular disease, unspecified: Secondary | ICD-10-CM | POA: Diagnosis not present

## 2023-05-01 DIAGNOSIS — L84 Corns and callosities: Secondary | ICD-10-CM | POA: Diagnosis not present

## 2023-05-16 ENCOUNTER — Telehealth: Payer: Self-pay | Admitting: Internal Medicine

## 2023-05-16 ENCOUNTER — Encounter: Payer: Self-pay | Admitting: Internal Medicine

## 2023-05-16 NOTE — Telephone Encounter (Signed)
Patient needs a letter for the court to get him dismissed from jury duty.  Patient feels that with his current medical conditions he is not able.  Please call patient when letter is ready to be picked up.  Patient's number:  [phone]

## 2023-05-16 NOTE — Telephone Encounter (Signed)
Ok to print and send letter from today, thanks

## 2023-05-16 NOTE — Telephone Encounter (Signed)
Called and let Pt know form has been placed up front.

## 2023-06-11 ENCOUNTER — Ambulatory Visit: Payer: Medicare Other | Admitting: Physician Assistant

## 2023-06-12 NOTE — Progress Notes (Unsigned)
Cardiology Office Note:  .   Date:  06/12/2023  ID:  Henry Ellis, Henry Ellis 1956-09-11, MRN [id number] PCP: Corwin Levins, MD  [facility] Providers Cardiologist:  Christell Constant, MD {  History of Present Illness: .   Henry Ellis is a 67 y.o. male with past medical history of atrial flutter on chronic anticoagulation, CKD with history of nephrectomy, HTN, HLD, aortic atherosclerosis, former tobacco abuse, morbid obesity who is here for follow-up appointment.  He was seen in the cardiology clinic 09/11/2022 by Dr. Izora Ribas.  At that time he was doing well.  He was then seen in follow-up by Clair Gulling, NP 01/15/2023.  At that time he denied chest pain, lower extremity edema, fatigue, palpitations, melena, hematuria, hemoptysis, diaphoresis, weakness, presyncope, syncope, orthopnea, and PND.  He did have some shortness of breath but he felt it was stable.  He was walking for exercise almost daily for 30 to 60 minutes.  No chest pain with exertion.  Today, he***  ROS: ***  Studies Reviewed: .       Echocardiogram 04/02/2023 IMPRESSIONS     1. Left ventricular ejection fraction, by estimation, is 60 to 65%. The  left ventricle has normal function. The left ventricle has no regional  wall motion abnormalities. There is mild left ventricular hypertrophy.  Left ventricular diastolic parameters  are indeterminate.   2. Right ventricular systolic function is normal. The right ventricular  size is normal. There is normal pulmonary artery systolic pressure.   3. The mitral valve is normal in structure. No evidence of mitral valve  regurgitation. No evidence of mitral stenosis.   4. The aortic valve is tricuspid. Aortic valve regurgitation is not  visualized. No aortic stenosis is present.   5. The inferior vena cava is normal in size with greater than 50%  respiratory variability, suggesting right atrial pressure of 3 mmHg.   Comparison(s): No significant change  from prior study. Prior images  reviewed side by side.   FINDINGS   Left Ventricle: Left ventricular ejection fraction, by estimation, is 60  to 65%. The left ventricle has normal function. The left ventricle has no  regional wall motion abnormalities. The left ventricular internal cavity  size was normal in size. There is   mild left ventricular hypertrophy. Left ventricular diastolic parameters  are indeterminate.   Right Ventricle: The right ventricular size is normal. No increase in  right ventricular wall thickness. Right ventricular systolic function is  normal. There is normal pulmonary artery systolic pressure. The tricuspid  regurgitant velocity is 1.33 m/s, and   with an assumed right atrial pressure of 3 mmHg, the estimated right  ventricular systolic pressure is 10.1 mmHg.   Left Atrium: Left atrial size was normal in size.   Right Atrium: Right atrial size was normal in size.   Pericardium: Trivial pericardial effusion is present. The pericardial  effusion is posterior to the left ventricle.   Mitral Valve: The mitral valve is normal in structure. No evidence of  mitral valve regurgitation. No evidence of mitral valve stenosis.   Tricuspid Valve: The tricuspid valve is normal in structure. Tricuspid  valve regurgitation is not demonstrated. No evidence of tricuspid  stenosis.   Aortic Valve: The aortic valve is tricuspid. Aortic valve regurgitation is  not visualized. No aortic stenosis is present.   Pulmonic Valve: The pulmonic valve was normal in structure. Pulmonic valve  regurgitation is trivial. No evidence of pulmonic stenosis.   Aorta: The  aortic root is normal in size and structure.   Venous: The inferior vena cava is normal in size with greater than 50%  respiratory variability, suggesting right atrial pressure of 3 mmHg.   IAS/Shunts: No atrial level shunt detected by color flow Doppler.   Risk Assessment/Calculations:   {Does this patient have  ATRIAL FIBRILLATION?:[id number]} No BP recorded.  {Refresh Note OR Click here to enter BP  :1}***       Physical Exam:   VS:  There were no vitals taken for this visit.   Wt Readings from Last 3 Encounters:  02/26/23 (!) 315 lb (142.9 kg)  01/23/23 (!) 306 lb (138.8 kg)  12/28/22 (!) 306 lb (138.8 kg)    GEN: Well nourished, well developed in no acute distress NECK: No JVD; No carotid bruits CARDIAC: ***RRR, no murmurs, rubs, gallops RESPIRATORY:  Clear to auscultation without rales, wheezing or rhonchi  ABDOMEN: Soft, non-tender, non-distended EXTREMITIES:  No edema; No deformity   ASSESSMENT AND PLAN: .   1.  Atrial flutter on chronic anticoagulation 2.  Hypertension 3.  Hyperlipidemia 4.  Aortic atherosclerosis 5.  CKD with history of nephrectomy 6.  Tobacco abuse 7.  Morbid obesity    {Are you ordering a CV Procedure (e.g. stress test, cath, DCCV, TEE, etc)?   Press F2        :[id number]}  Dispo: ***  Signed, Sharlene Dory, PA-C

## 2023-06-13 ENCOUNTER — Ambulatory Visit: Payer: Medicare Other | Attending: Physician Assistant | Admitting: Physician Assistant

## 2023-06-13 VITALS — HR 68 | Ht 73.0 in | Wt 312.0 lb

## 2023-06-13 DIAGNOSIS — I10 Essential (primary) hypertension: Secondary | ICD-10-CM

## 2023-06-13 DIAGNOSIS — E785 Hyperlipidemia, unspecified: Secondary | ICD-10-CM

## 2023-06-13 DIAGNOSIS — I700 Atherosclerosis of aorta: Secondary | ICD-10-CM

## 2023-06-13 DIAGNOSIS — E7800 Pure hypercholesterolemia, unspecified: Secondary | ICD-10-CM

## 2023-06-13 DIAGNOSIS — N1831 Chronic kidney disease, stage 3a: Secondary | ICD-10-CM | POA: Diagnosis not present

## 2023-06-13 DIAGNOSIS — I4892 Unspecified atrial flutter: Secondary | ICD-10-CM

## 2023-06-13 DIAGNOSIS — Z905 Acquired absence of kidney: Secondary | ICD-10-CM

## 2023-06-13 NOTE — Patient Instructions (Signed)
Medication Instructions:   Your physician recommends that you continue on your current medications as directed. Please refer to the Current Medication list given to you today.   *If you need a refill on your cardiac medications before your next appointment, please call your pharmacy*   Lab Work: NONE ORDERED  TODAY    If you have labs (blood work) drawn today and your tests are completely normal, you will receive your results only by: MyChart Message (if you have MyChart) OR A paper copy in the mail If you have any lab test that is abnormal or we need to change your treatment, we will call you to review the results.   Testing/Procedures: NONE ORDERED  TODAY      Follow-Up: At [facility], you and your health needs are our priority.  As part of our continuing mission to provide you with exceptional heart care, we have created designated Provider Care Teams.  These Care Teams include your primary Cardiologist (physician) and Advanced Practice Providers (APPs -  Physician Assistants and Nurse Practitioners) who all work together to provide you with the care you need, when you need it.  We recommend signing up for the patient portal called "MyChart".  Sign up information is provided on this After Visit Summary.  MyChart is used to connect with patients for Virtual Visits (Telemedicine).  Patients are able to view lab/test results, encounter notes, upcoming appointments, etc.  Non-urgent messages can be sent to your provider as well.   To learn more about what you can do with MyChart, go to [email or web address].    Your next appointment:    6 month(s)  Provider:   Christell Constant, MD     Other Instructions  Low-Sodium Eating Plan Salt (sodium) helps you keep a healthy balance of fluids in your body. Too much sodium can raise your blood pressure. It can also cause fluid and waste to be held in your body. Your health care provider or dietitian may recommend a  low-sodium eating plan if you have high blood pressure (hypertension), kidney disease, liver disease, or heart failure. Eating less sodium can help lower your blood pressure and reduce swelling. It can also protect your heart, liver, and kidneys. What are tips for following this plan? Reading food labels  Check food labels for the amount of sodium per serving. If you eat more than one serving, you must multiply the listed amount by the number of servings. Choose foods with less than 140 milligrams (mg) of sodium per serving. Avoid foods with 300 mg of sodium or more per serving. Always check how much sodium is in a product, even if the label says "unsalted" or "no salt added." Shopping  Buy products labeled as "low-sodium" or "no salt added." Buy fresh foods. Avoid canned foods and pre-made or frozen meals. Avoid canned, cured, or processed meats. Buy breads that have less than 80 mg of sodium per slice. Cooking  Eat more home-cooked food. Try to eat less restaurant, buffet, and fast food. Try not to add salt when you cook. Use salt-free seasonings or herbs instead of table salt or sea salt. Check with your provider or pharmacist before using salt substitutes. Cook with plant-based oils, such as canola, sunflower, or olive oil. Meal planning When eating at a restaurant, ask if your food can be made with less salt or no salt. Avoid dishes labeled as brined, pickled, cured, or smoked. Avoid dishes made with soy sauce, miso, or teriyaki sauce. Avoid foods  that have monosodium glutamate (MSG) in them. MSG may be added to some restaurant food, sauces, soups, bouillon, and canned foods. Make meals that can be grilled, baked, poached, roasted, or steamed. These are often made with less sodium. General information Try to limit your sodium intake to 1,500-2,300 mg each day, or the amount told by your provider. What foods should I eat? Fruits Fresh, frozen, or canned fruit. Fruit  juice. Vegetables Fresh or frozen vegetables. "No salt added" canned vegetables. "No salt added" tomato sauce and paste. Low-sodium or reduced-sodium tomato and vegetable juice. Grains Low-sodium cereals, such as oats, puffed wheat and rice, and shredded wheat. Low-sodium crackers. Unsalted rice. Unsalted pasta. Low-sodium bread. Whole grain breads and whole grain pasta. Meats and other proteins Fresh or frozen meat, poultry, seafood, and fish. These should have no added salt. Low-sodium canned tuna and salmon. Unsalted nuts. Dried peas, beans, and lentils without added salt. Unsalted canned beans. Eggs. Unsalted nut butters. Dairy Milk. Soy milk. Cheese that is naturally low in sodium, such as ricotta cheese, fresh mozzarella, or Swiss cheese. Low-sodium or reduced-sodium cheese. Cream cheese. Yogurt. Seasonings and condiments Fresh and dried herbs and spices. Salt-free seasonings. Low-sodium mustard and ketchup. Sodium-free salad dressing. Sodium-free light mayonnaise. Fresh or refrigerated horseradish. Lemon juice. Vinegar. Other foods Homemade, reduced-sodium, or low-sodium soups. Unsalted popcorn and pretzels. Low-salt or salt-free chips. The items listed above may not be all the foods and drinks you can have. Talk to a dietitian to learn more. What foods should I avoid? Vegetables Sauerkraut, pickled vegetables, and relishes. Olives. [place] fries. Onion rings. Regular canned vegetables, except low-sodium or reduced-sodium items. Regular canned tomato sauce and paste. Regular tomato and vegetable juice. Frozen vegetables in sauces. Grains Instant hot cereals. Bread stuffing, pancake, and biscuit mixes. Croutons. Seasoned rice or pasta mixes. Noodle soup cups. Boxed or frozen macaroni and cheese. Regular salted crackers. Self-rising flour. Meats and other proteins Meat or fish that is salted, canned, smoked, spiced, or pickled. Precooked or cured meat, such as sausages or meat loaves. Tomasa Blase.  Ham. Pepperoni. Hot dogs. Corned beef. Chipped beef. Salt pork. Jerky. Pickled herring, anchovies, and sardines. Regular canned tuna. Salted nuts. Dairy Processed cheese and cheese spreads. Hard cheeses. Cheese curds. Blue cheese. Feta cheese. String cheese. Regular cottage cheese. Buttermilk. Canned milk. Fats and oils Salted butter. Regular margarine. Ghee. Bacon fat. Seasonings and condiments Onion salt, garlic salt, seasoned salt, table salt, and sea salt. Canned and packaged gravies. Worcestershire sauce. Tartar sauce. Barbecue sauce. Teriyaki sauce. Soy sauce, including reduced-sodium soy sauce. Steak sauce. Fish sauce. Oyster sauce. Cocktail sauce. Horseradish that you find on the shelf. Regular ketchup and mustard. Meat flavorings and tenderizers. Bouillon cubes. Hot sauce. Pre-made or packaged marinades. Pre-made or packaged taco seasonings. Relishes. Regular salad dressings. Salsa. Other foods Salted popcorn and pretzels. Corn chips and puffs. Potato and tortilla chips. Canned or dried soups. Pizza. Frozen entrees and pot pies. The items listed above may not be all the foods and drinks you should avoid. Talk to a dietitian to learn more. This information is not intended to replace advice given to you by your health care provider. Make sure you discuss any questions you have with your health care provider. Document Revised: 12/21/2022 Document Reviewed: 12/21/2022 Elsevier Patient Education  2024 [employer].

## 2023-07-11 ENCOUNTER — Ambulatory Visit (INDEPENDENT_AMBULATORY_CARE_PROVIDER_SITE_OTHER): Payer: Medicare Other

## 2023-07-11 VITALS — Ht 73.0 in | Wt 312.0 lb

## 2023-07-11 DIAGNOSIS — Z0000 Encounter for general adult medical examination without abnormal findings: Secondary | ICD-10-CM

## 2023-07-11 NOTE — Progress Notes (Signed)
Subjective:   Henry Ellis is a 67 y.o. male who presents for Medicare Annual/Subsequent preventive examination.  Visit Complete: Virtual  I connected with  Henry Ellis on 07/11/23 by a audio enabled telemedicine application and verified that I am speaking with the correct person using two identifiers.  Patient Location: Home  Provider Location: Office/Clinic  I discussed the limitations of evaluation and management by telemedicine. The patient expressed understanding and agreed to proceed.  Per patient no change in vitals since last visit; unable to obtain new vitals due to this being a telehealth visit. Patient was unable to self-report vital signs via telehealth due to a lack of equipment at home.  Review of Systems     Cardiac Risk Factors include: advanced age (>57men, >24 women);dyslipidemia;family history of premature cardiovascular disease;hypertension;male gender;obesity (BMI >30kg/m2)     Objective:    Today's Vitals   07/11/23 0847  Weight: (!) 312 lb (141.5 kg)  Height: 6\' 1"  (1.854 m)  PainSc: 0-No pain   Body mass index is 41.16 kg/m.     07/11/2023    8:50 AM 07/07/2022    1:39 PM 06/29/2021   11:56 AM 07/09/2017    9:31 AM 06/25/2017    9:56 AM 10/18/2016    6:46 AM 10/16/2016   11:21 AM  Advanced Directives  Does Patient Have a Medical Advance Directive? Yes Yes Yes Yes Yes Yes Yes  Type of Estate agent of [place];Living will Healthcare Power of [place];Living will   Living will;Healthcare Power of [employer] Power of [place];Living will Healthcare Power of [place];Living will  Does patient want to make changes to medical advance directive? No - Patient declined  No - Patient declined      Copy of Healthcare Power of Attorney in Chart? Yes - validated most recent copy scanned in chart (See row information) No - copy requested    Yes --    Current Medications (verified) Outpatient Encounter Medications as of  07/11/2023  Medication Sig   amLODipine (NORVASC) 2.5 MG tablet Take 1 tablet (2.5 mg total) by mouth daily.   atorvastatin (LIPITOR) 40 MG tablet Take 1 tablet (40 mg total) by mouth daily.   Cholecalciferol (VITAMIN D3 PO) Take 5,000 Units by mouth daily.   Cyanocobalamin (VITAMIN B-12 PO) Take by mouth daily.   dapagliflozin propanediol (FARXIGA) 5 MG TABS tablet Take 1 tablet (5 mg total) by mouth daily before breakfast.   docusate sodium (COLACE) 100 MG capsule Take 100 mg by mouth daily.   esomeprazole (NEXIUM) 20 MG capsule Take 1 capsule (20 mg total) by mouth daily at 12 noon.   ferrous sulfate 325 (65 FE) MG tablet Take 325 mg by mouth daily with breakfast.   finasteride (PROSCAR) 5 MG tablet Take 5 mg by mouth daily.   Psyllium 30.9 % POWD Take 1 packet by mouth daily.   rivaroxaban (XARELTO) 20 MG TABS tablet TAKE 1 TABLET(20 MG) BY MOUTH DAILY WITH SUPPER   solifenacin (VESICARE) 5 MG tablet Take 1 tablet (5 mg total) by mouth daily.   No facility-administered encounter medications on file as of 07/11/2023.    Allergies (verified) Bee venom, Amoxicillin, Apixaban, Aspirin, and Other   History: Past Medical History:  Diagnosis Date   Allergy    seasonal allergies   Anemia    on meds   Anxiety    hx of   Arthritis    generalized   Barrett's esophagus    Cancer (HCC)  10/17/2016   kidney cancer(removed left kidney in 2017)   Carpal tunnel syndrome, right 04/21/2011   Chronic bronchitis    Chronic kidney disease 2017   2017-had left kidney removed   ED (erectile dysfunction)    Esophageal reflux    on meds   Esophageal stricture    Essential hypertension 10/19/2015   on meds   H/O adenomatous polyp of colon 06/05/2012   Hiatal hernia    High cholesterol    on meds   History of Bell's palsy    IBS (irritable bowel syndrome)    Personal history of colonic polyps 04/15/2008 & 04/22/2012   TUBULAR ADENOMA   Ulcerative esophagitis    Past Surgical History:   Procedure Laterality Date   CARPAL TUNNEL RELEASE     left   INGUINAL HERNIA REPAIR     bilateral   KNEE ARTHROSCOPY     right Dr Thomasena Edis   ROBOT ASSISTED LAPAROSCOPIC NEPHRECTOMY Left 10/18/2016   Procedure: XI ROBOTIC ASSISTED LAPAROSCOPIC RADICAL NEPHRECTOMY;  Surgeon: Sebastian Ache, MD;  Location: [facility];  Service: Urology;  Laterality: Left;   WISDOM TOOTH EXTRACTION     Family History  Problem Relation Age of Onset   Heart disease Father    Lung cancer Father    Hypertension Brother        THROUGHOUT FAMILY   Colon cancer Neg Hx    Stomach cancer Neg Hx    Esophageal cancer Neg Hx    Rectal cancer Neg Hx    Colon polyps Neg Hx    Social History   Socioeconomic History   Marital status: Single    Spouse name: Not on file   Number of children: Not on file   Years of education: Not on file   Highest education level: Not on file  Occupational History   Occupation: Curator    Employer: [employer]  Tobacco Use   Smoking status: Former    Current packs/day: 0.00    Types: Cigarettes    Quit date: 10/17/2016    Years since quitting: 6.7   Smokeless tobacco: Never   Tobacco comments:    Counseling sheet given 03-2012   Vaping Use   Vaping status: Former  Substance and Sexual Activity   Alcohol use: Yes    Alcohol/week: 3.0 standard drinks of alcohol    Types: 3 Shots of liquor per week   Drug use: Not Currently    Frequency: 2.0 times per week    Types: Marijuana    Comment: last use June 05, 2016   Sexual activity: Not on file  Other Topics Concern   Not on file  Social History Narrative   Not on file   Social Determinants of Health   Financial Resource Strain: Low Risk  (07/11/2023)   Overall Financial Resource Strain (CARDIA)    Difficulty of Paying Living Expenses: Not hard at all  Food Insecurity: No Food Insecurity (07/11/2023)   Hunger Vital Sign    Worried About Running Out of Food in the Last Year: Never true    Ran Out of Food in  the Last Year: Never true  Transportation Needs: No Transportation Needs (07/11/2023)   PRAPARE - Administrator, Civil Service (Medical): No    Lack of Transportation (Non-Medical): No  Physical Activity: Sufficiently Active (07/11/2023)   Exercise Vital Sign    Days of Exercise per Week: 5 days    Minutes of Exercise per Session: 30 min  Stress: No Stress Concern Present (07/11/2023)   [employer] of Occupational Health - Occupational Stress Questionnaire    Feeling of Stress : Not at all  Social Connections: Socially Isolated (07/11/2023)   Social Connection and Isolation Panel [NHANES]    Frequency of Communication with Friends and Family: Three times a week    Frequency of Social Gatherings with Friends and Family: Three times a week    Attends Religious Services: Never    Active Member of Clubs or Organizations: No    Attends Banker Meetings: Never    Marital Status: Never married    Tobacco Counseling Counseling given: Not Answered Tobacco comments: Counseling sheet given 03-2012    Clinical Intake:  Pre-visit preparation completed: Yes  Pain : No/denies pain Pain Score: 0-No pain     BMI - recorded: 41.16 Nutritional Risks: None Diabetes: No  How often do you need to have someone help you when you read instructions, pamphlets, or other written materials from your doctor or pharmacy?: 1 - Never What is the last grade level you completed in school?: HSG  Interpreter Needed?: No  Information entered by :: Donald Jacque N. Calinda Stockinger, LPN.   Activities of Daily Living    07/11/2023    8:52 AM  In your present state of health, do you have any difficulty performing the following activities:  Hearing? 0  Vision? 0  Difficulty concentrating or making decisions? 0  Walking or climbing stairs? 0  Dressing or bathing? 0  Doing errands, shopping? 0  Preparing Food and eating ? N  Using the Toilet? N  In the past six months, have you accidently  leaked urine? N  Do you have problems with loss of bowel control? N  Managing your Medications? N  Managing your Finances? N  Housekeeping or managing your Housekeeping? N    Patient Care Team: Corwin Levins, MD as PCP - General (Internal Medicine) Christell Constant, MD as PCP - Cardiology (Cardiology) Blima Ledger, OD as Consulting Physician (Optometry)  Indicate any recent Medical Services you may have received from other than [facility] providers in the past year (date may be approximate).     Assessment:   This is a routine wellness examination for [place].  Hearing/Vision screen Hearing Screening - Comments:: Denies hearing difficulties   Vision Screening - Comments:: Wears rx glasses - up to date with routine eye exams with Blima Ledger, OD.   Dietary issues and exercise activities discussed:     Goals Addressed             This Visit's Progress    My healthcare goal for 2024 is to maintain my current health status by continuing to eat healthy, stay independent, physically and socially active.        Depression Screen    07/11/2023    8:51 AM 08/28/2022    8:02 AM 07/07/2022    1:43 PM 07/07/2022    1:40 PM 07/07/2022    1:38 PM 05/17/2022    8:45 AM 02/24/2022    8:57 AM  PHQ 2/9 Scores  PHQ - 2 Score 0 0 0 0 0 0 0  PHQ- 9 Score 0 0    0     Fall Risk    07/11/2023    8:50 AM 02/26/2023    8:26 AM 08/28/2022    8:03 AM 07/07/2022    1:40 PM 05/17/2022    8:44 AM  Fall Risk   Falls in the past year?  0 0 0 0 0  Number falls in past yr: 0 0  0 0  Injury with Fall? 0 0 0 0 0  Risk for fall due to : No Fall Risks No Fall Risks No Fall Risks No Fall Risks   Follow up Falls prevention discussed Falls evaluation completed;Education provided Falls evaluation completed Falls evaluation completed;Education provided     MEDICARE RISK AT HOME:  Medicare Risk at Home - 07/11/23 0850     Any stairs in or around the home? No    If so, are there any without handrails?  No    Home free of loose throw rugs in walkways, pet beds, electrical cords, etc? Yes    Adequate lighting in your home to reduce risk of falls? Yes    Life alert? No    Use of a cane, walker or w/c? No    Grab bars in the bathroom? No    Shower chair or bench in shower? No    Elevated toilet seat or a handicapped toilet? Yes             TIMED UP AND GO:  Was the test performed?  No    Cognitive Function:        07/11/2023    8:51 AM  6CIT Screen  What Year? 0 points  What month? 0 points  What time? 0 points  Count back from 20 0 points  Months in reverse 0 points  Repeat phrase 0 points  Total Score 0 points    Immunizations Immunization History  Administered Date(s) Administered   Fluad Quad(high Dose 65+) 09/16/2020, 08/26/2021, 08/28/2022   Influenza Split 09/27/2012   Influenza Whole 09/07/2008, 12/04/2011   Influenza,inj,Quad PF,6+ Mos 10/03/2013, 10/09/2014, 10/19/2016, 01/25/2018   Influenza-Unspecified 08/30/2018   PFIZER(Purple Top)SARS-COV-2 Vaccination 01/09/2020, 01/30/2020, 01/25/2021   Pneumococcal Conjugate-13 08/01/2019   Pneumococcal Polysaccharide-23 03/05/2009, 10/19/2016, 02/24/2022   Td 03/05/2009   Td (Adult), 2 Lf Tetanus Toxid, Preservative Free 03/05/2009   Tdap 08/01/2019   Zoster, Live 04/16/2015    TDAP status: Up to date  Flu Vaccine status: Up to date  Pneumococcal vaccine status: Up to date  Covid-19 vaccine status: Completed vaccines  Qualifies for Shingles Vaccine? Yes   Zostavax completed Yes   Shingrix Completed?: No.    Education has been provided regarding the importance of this vaccine. Patient has been advised to call insurance company to determine out of pocket expense if they have not yet received this vaccine. Advised may also receive vaccine at local pharmacy or Health Dept. Verbalized acceptance and understanding.  Screening Tests Health Maintenance  Topic Date Due   Zoster Vaccines- Shingrix (1 of 2)  06[birth year]05/1973   COVID-19 Vaccine (4 - 2023-24 season) 08/18/2022   OPHTHALMOLOGY EXAM  03/18/2023   INFLUENZA VACCINE  07/19/2023   HEMOGLOBIN A1C  08/25/2023   Diabetic kidney evaluation - eGFR measurement  02/22/2024   Diabetic kidney evaluation - Urine ACR  02/22/2024   FOOT EXAM  02/26/2024   Medicare Annual Wellness (AWV)  07/10/2024   Colonoscopy  01/23/2026   DTaP/Tdap/Td (4 - Td or Tdap) 07/31/2029   Pneumonia Vaccine 50+ Years old  Completed   Hepatitis C Screening  Completed   HPV VACCINES  Aged Out    Health Maintenance  Health Maintenance Due  Topic Date Due   Zoster Vaccines- Shingrix (1 of 2) 06/05/1973   COVID-19 Vaccine (4 - 2023-24 season) 08/18/2022   OPHTHALMOLOGY EXAM  03/18/2023  Colorectal cancer screening: Type of screening: Colonoscopy. Completed 01/23/2023. Repeat every 3 years  Lung Cancer Screening: (Low Dose CT Chest recommended if Age 35-80 years, 20 pack-year currently smoking OR have quit w/in 15years.) does not qualify.   Lung Cancer Screening Referral: No  Additional Screening:  Hepatitis C Screening: does qualify; Completed 04/21/2016  Vision Screening: Recommended annual ophthalmology exams for early detection of glaucoma and other disorders of the eye. Is the patient up to date with their annual eye exam?  Yes  Who is the provider or what is the name of the office in which the patient attends annual eye exams? Blima Ledger, OD. If pt is not established with a provider, would they like to be referred to a provider to establish care? No .   Dental Screening: Recommended annual dental exams for proper oral hygiene  Diabetic Foot Exam: Diabetic Foot Exam: Completed 02/26/2023  Community Resource Referral / Chronic Care Management: CRR required this visit?  No   CCM required this visit?  No     Plan:     I have personally reviewed and noted the following in the patient's chart:   Medical and social history Use of alcohol, tobacco or  illicit drugs  Current medications and supplements including opioid prescriptions. Patient is not currently taking opioid prescriptions. Functional ability and status Nutritional status Physical activity Advanced directives List of other physicians Hospitalizations, surgeries, and ER visits in previous 12 months Vitals Screenings to include cognitive, depression, and falls Referrals and appointments  In addition, I have reviewed and discussed with patient certain preventive protocols, quality metrics, and best practice recommendations. A written personalized care plan for preventive services as well as general preventive health recommendations were provided to patient.     Mickeal Needy, LPN   1/61/0960   After Visit Summary: (Mail) Due to this being a telephonic visit, the after visit summary with patients personalized plan was offered to patient via mail   Nurse Notes: Normal cognitive status assessed by direct observation via telephone conversation by this Nurse Health Advisor. No abnormalities found.

## 2023-07-11 NOTE — Patient Instructions (Addendum)
Henry Ellis , Thank you for taking time to come for your Medicare Wellness Visit. I appreciate your ongoing commitment to your health goals. Please review the following plan we discussed and let me know if I can assist you in the future.   These are the goals we discussed:  Goals      My healthcare goal for 2024 is to maintain my current health status by continuing to eat healthy, stay independent, physically and socially active.        This is a list of the screening recommended for you and due dates:  Health Maintenance  Topic Date Due   Zoster (Shingles) Vaccine (1 of 2) 06/05/1973   COVID-19 Vaccine (4 - 2023-24 season) 08/18/2022   Eye exam for diabetics  03/18/2023   Flu Shot  07/19/2023   Hemoglobin A1C  08/25/2023   Yearly kidney function blood test for diabetes  02/22/2024   Yearly kidney health urinalysis for diabetes  02/22/2024   Complete foot exam   02/26/2024   Medicare Annual Wellness Visit  07/10/2024   Colon Cancer Screening  01/23/2026   DTaP/Tdap/Td vaccine (4 - Td or Tdap) 07/31/2029   Pneumonia Vaccine  Completed   Hepatitis C Screening  Completed   HPV Vaccine  Aged Out    Advanced directives: Yes; documents on file.  Conditions/risks identified: Yes  Next appointment: It was nice speaking with you today!  Please follow up in one year for your annual wellness visit via telephone call with Nurse Percell Miller on 07/14/2024 at 8:45 a.m.  If you need to cancel or reschedule please call [phone].  Preventive Care 18 Years and Older, Male  Preventive care refers to lifestyle choices and visits with your health care provider that can promote health and wellness. What does preventive care include? A yearly physical exam. This is also called an annual well check. Dental exams once or twice a year. Routine eye exams. Ask your health care provider how often you should have your eyes checked. Personal lifestyle choices, including: Daily care of your teeth and  gums. Regular physical activity. Eating a healthy diet. Avoiding tobacco and drug use. Limiting alcohol use. Practicing safe sex. Taking low doses of aspirin every day. Taking vitamin and mineral supplements as recommended by your health care provider. What happens during an annual well check? The services and screenings done by your health care provider during your annual well check will depend on your age, overall health, lifestyle risk factors, and family history of disease. Counseling  Your health care provider may ask you questions about your: Alcohol use. Tobacco use. Drug use. Emotional well-being. Home and relationship well-being. Sexual activity. Eating habits. History of falls. Memory and ability to understand (cognition). Work and work Astronomer. Screening  You may have the following tests or measurements: Height, weight, and BMI. Blood pressure. Lipid and cholesterol levels. These may be checked every 5 years, or more frequently if you are over 32 years old. Skin check. Lung cancer screening. You may have this screening every year starting at age 66 if you have a 30-pack-year history of smoking and currently smoke or have quit within the past 15 years. Fecal occult blood test (FOBT) of the stool. You may have this test every year starting at age 75. Flexible sigmoidoscopy or colonoscopy. You may have a sigmoidoscopy every 5 years or a colonoscopy every 10 years starting at age 13. Prostate cancer screening. Recommendations will vary depending on your family history and other risks.  Hepatitis C blood test. Hepatitis B blood test. Sexually transmitted disease (STD) testing. Diabetes screening. This is done by checking your blood sugar (glucose) after you have not eaten for a while (fasting). You may have this done every 1-3 years. Abdominal aortic aneurysm (AAA) screening. You may need this if you are a current or former smoker. Osteoporosis. You may be screened  starting at age 10 if you are at high risk. Talk with your health care provider about your test results, treatment options, and if necessary, the need for more tests. Vaccines  Your health care provider may recommend certain vaccines, such as: Influenza vaccine. This is recommended every year. Tetanus, diphtheria, and acellular pertussis (Tdap, Td) vaccine. You may need a Td booster every 10 years. Zoster vaccine. You may need this after age 55. Pneumococcal 13-valent conjugate (PCV13) vaccine. One dose is recommended after age 58. Pneumococcal polysaccharide (PPSV23) vaccine. One dose is recommended after age 32. Talk to your health care provider about which screenings and vaccines you need and how often you need them. This information is not intended to replace advice given to you by your health care provider. Make sure you discuss any questions you have with your health care provider. Document Released: 12/31/2015 Document Revised: 08/23/2016 Document Reviewed: 10/05/2015 Elsevier Interactive Patient Education  2017 [employer].  Fall Prevention in the Home Falls can cause injuries. They can happen to people of all ages. There are many things you can do to make your home safe and to help prevent falls. What can I do on the outside of my home? Regularly fix the edges of walkways and driveways and fix any cracks. Remove anything that might make you trip as you walk through a door, such as a raised step or threshold. Trim any bushes or trees on the path to your home. Use bright outdoor lighting. Clear any walking paths of anything that might make someone trip, such as rocks or tools. Regularly check to see if handrails are loose or broken. Make sure that both sides of any steps have handrails. Any raised decks and porches should have guardrails on the edges. Have any leaves, snow, or ice cleared regularly. Use sand or salt on walking paths during winter. Clean up any spills in your garage  right away. This includes oil or grease spills. What can I do in the bathroom? Use night lights. Install grab bars by the toilet and in the tub and shower. Do not use towel bars as grab bars. Use non-skid mats or decals in the tub or shower. If you need to sit down in the shower, use a plastic, non-slip stool. Keep the floor dry. Clean up any water that spills on the floor as soon as it happens. Remove soap buildup in the tub or shower regularly. Attach bath mats securely with double-sided non-slip rug tape. Do not have throw rugs and other things on the floor that can make you trip. What can I do in the bedroom? Use night lights. Make sure that you have a light by your bed that is easy to reach. Do not use any sheets or blankets that are too big for your bed. They should not hang down onto the floor. Have a firm chair that has side arms. You can use this for support while you get dressed. Do not have throw rugs and other things on the floor that can make you trip. What can I do in the kitchen? Clean up any spills right away. Avoid  walking on wet floors. Keep items that you use a lot in easy-to-reach places. If you need to reach something above you, use a strong step stool that has a grab bar. Keep electrical cords out of the way. Do not use floor polish or wax that makes floors slippery. If you must use wax, use non-skid floor wax. Do not have throw rugs and other things on the floor that can make you trip. What can I do with my stairs? Do not leave any items on the stairs. Make sure that there are handrails on both sides of the stairs and use them. Fix handrails that are broken or loose. Make sure that handrails are as long as the stairways. Check any carpeting to make sure that it is firmly attached to the stairs. Fix any carpet that is loose or worn. Avoid having throw rugs at the top or bottom of the stairs. If you do have throw rugs, attach them to the floor with carpet tape. Make  sure that you have a light switch at the top of the stairs and the bottom of the stairs. If you do not have them, ask someone to add them for you. What else can I do to help prevent falls? Wear shoes that: Do not have high heels. Have rubber bottoms. Are comfortable and fit you well. Are closed at the toe. Do not wear sandals. If you use a stepladder: Make sure that it is fully opened. Do not climb a closed stepladder. Make sure that both sides of the stepladder are locked into place. Ask someone to hold it for you, if possible. Clearly mark and make sure that you can see: Any grab bars or handrails. First and last steps. Where the edge of each step is. Use tools that help you move around (mobility aids) if they are needed. These include: Canes. Walkers. Scooters. Crutches. Turn on the lights when you go into a dark area. Replace any light bulbs as soon as they burn out. Set up your furniture so you have a clear path. Avoid moving your furniture around. If any of your floors are uneven, fix them. If there are any pets around you, be aware of where they are. Review your medicines with your doctor. Some medicines can make you feel dizzy. This can increase your chance of falling. Ask your doctor what other things that you can do to help prevent falls. This information is not intended to replace advice given to you by your health care provider. Make sure you discuss any questions you have with your health care provider. Document Released: 09/30/2009 Document Revised: 05/11/2016 Document Reviewed: 01/08/2015 Elsevier Interactive Patient Education  2017 [employer].

## 2023-07-12 DIAGNOSIS — K080 Exfoliation of teeth due to systemic causes: Secondary | ICD-10-CM | POA: Diagnosis not present

## 2023-07-18 DIAGNOSIS — D6869 Other thrombophilia: Secondary | ICD-10-CM | POA: Diagnosis not present

## 2023-07-18 DIAGNOSIS — I2510 Atherosclerotic heart disease of native coronary artery without angina pectoris: Secondary | ICD-10-CM | POA: Diagnosis not present

## 2023-07-26 DIAGNOSIS — K080 Exfoliation of teeth due to systemic causes: Secondary | ICD-10-CM | POA: Diagnosis not present

## 2023-08-01 DIAGNOSIS — I739 Peripheral vascular disease, unspecified: Secondary | ICD-10-CM | POA: Diagnosis not present

## 2023-08-01 DIAGNOSIS — L600 Ingrowing nail: Secondary | ICD-10-CM | POA: Diagnosis not present

## 2023-08-23 ENCOUNTER — Other Ambulatory Visit (INDEPENDENT_AMBULATORY_CARE_PROVIDER_SITE_OTHER): Payer: Medicare Other

## 2023-08-23 DIAGNOSIS — R7302 Impaired glucose tolerance (oral): Secondary | ICD-10-CM

## 2023-08-23 DIAGNOSIS — E559 Vitamin D deficiency, unspecified: Secondary | ICD-10-CM

## 2023-08-23 DIAGNOSIS — E7800 Pure hypercholesterolemia, unspecified: Secondary | ICD-10-CM

## 2023-08-23 DIAGNOSIS — E538 Deficiency of other specified B group vitamins: Secondary | ICD-10-CM

## 2023-08-23 LAB — BASIC METABOLIC PANEL
BUN: 20 mg/dL (ref 6–23)
CO2: 30 meq/L (ref 19–32)
Calcium: 9.1 mg/dL (ref 8.4–10.5)
Chloride: 101 meq/L (ref 96–112)
Creatinine, Ser: 1.32 mg/dL (ref 0.40–1.50)
GFR: 55.15 mL/min — ABNORMAL LOW (ref >60.00)
Glucose, Bld: 99 mg/dL (ref 70–99)
Potassium: 4.3 meq/L (ref 3.5–5.1)
Sodium: 136 meq/L (ref 135–145)

## 2023-08-23 LAB — HEMOGLOBIN A1C: Hgb A1c MFr Bld: 6.2 % (ref 4.6–6.5)

## 2023-08-23 LAB — LIPID PANEL
Cholesterol: 97 mg/dL (ref 0–200)
HDL: 46.5 mg/dL (ref >39.00)
LDL Cholesterol: 37 mg/dL (ref 0–99)
NonHDL: 50.99
Total CHOL/HDL Ratio: 2
Triglycerides: 71 mg/dL (ref 0.0–149.0)
VLDL: 14.2 mg/dL (ref 0.0–40.0)

## 2023-08-23 LAB — VITAMIN B12: Vitamin B-12: 683 pg/mL (ref 211–911)

## 2023-08-23 LAB — HEPATIC FUNCTION PANEL
ALT: 14 U/L (ref 0–53)
AST: 13 U/L (ref 0–37)
Albumin: 3.9 g/dL (ref 3.5–5.2)
Alkaline Phosphatase: 95 U/L (ref 39–117)
Bilirubin, Direct: 0.2 mg/dL (ref 0.0–0.3)
Total Bilirubin: 0.9 mg/dL (ref 0.2–1.2)
Total Protein: 7.2 g/dL (ref 6.0–8.3)

## 2023-08-23 LAB — VITAMIN D 25 HYDROXY (VIT D DEFICIENCY, FRACTURES): VITD: 65.38 ng/mL (ref 30.00–100.00)

## 2023-08-29 ENCOUNTER — Encounter: Payer: Self-pay | Admitting: Internal Medicine

## 2023-08-29 ENCOUNTER — Ambulatory Visit (INDEPENDENT_AMBULATORY_CARE_PROVIDER_SITE_OTHER): Payer: Medicare Other | Admitting: Internal Medicine

## 2023-08-29 VITALS — BP 126/78 | HR 53 | Temp 98.5°F | Ht 73.0 in | Wt 315.0 lb

## 2023-08-29 DIAGNOSIS — I700 Atherosclerosis of aorta: Secondary | ICD-10-CM

## 2023-08-29 DIAGNOSIS — N1831 Chronic kidney disease, stage 3a: Secondary | ICD-10-CM

## 2023-08-29 DIAGNOSIS — Z125 Encounter for screening for malignant neoplasm of prostate: Secondary | ICD-10-CM

## 2023-08-29 DIAGNOSIS — Z23 Encounter for immunization: Secondary | ICD-10-CM | POA: Diagnosis not present

## 2023-08-29 DIAGNOSIS — E538 Deficiency of other specified B group vitamins: Secondary | ICD-10-CM

## 2023-08-29 DIAGNOSIS — E7800 Pure hypercholesterolemia, unspecified: Secondary | ICD-10-CM | POA: Diagnosis not present

## 2023-08-29 DIAGNOSIS — R600 Localized edema: Secondary | ICD-10-CM | POA: Diagnosis not present

## 2023-08-29 DIAGNOSIS — R7302 Impaired glucose tolerance (oral): Secondary | ICD-10-CM

## 2023-08-29 DIAGNOSIS — I2510 Atherosclerotic heart disease of native coronary artery without angina pectoris: Secondary | ICD-10-CM

## 2023-08-29 DIAGNOSIS — I10 Essential (primary) hypertension: Secondary | ICD-10-CM

## 2023-08-29 DIAGNOSIS — E559 Vitamin D deficiency, unspecified: Secondary | ICD-10-CM

## 2023-08-29 DIAGNOSIS — I2584 Coronary atherosclerosis due to calcified coronary lesion: Secondary | ICD-10-CM

## 2023-08-29 MED ORDER — DAPAGLIFLOZIN PROPANEDIOL 10 MG PO TABS: 10.0000 mg | ORAL_TABLET | Freq: Every day | ORAL | 3 refills | Status: DC

## 2023-08-29 MED ORDER — ATORVASTATIN CALCIUM 40 MG PO TABS
40.0000 mg | ORAL_TABLET | Freq: Every day | ORAL | 3 refills | Status: DC
Start: 2023-08-29 — End: 2024-02-26

## 2023-08-29 MED ORDER — ESOMEPRAZOLE MAGNESIUM 20 MG PO CPDR: 20.0000 mg | DELAYED_RELEASE_CAPSULE | Freq: Every day | ORAL | 3 refills | Status: DC

## 2023-08-29 MED ORDER — SOLIFENACIN SUCCINATE 5 MG PO TABS: 5.0000 mg | ORAL_TABLET | Freq: Every day | ORAL | 3 refills | Status: DC

## 2023-08-29 MED ORDER — FINASTERIDE 5 MG PO TABS: 5.0000 mg | ORAL_TABLET | Freq: Every day | ORAL | 3 refills | Status: DC

## 2023-08-29 MED ORDER — AMLODIPINE BESYLATE 2.5 MG PO TABS: 2.5000 mg | ORAL_TABLET | Freq: Every day | ORAL | 3 refills | Status: DC

## 2023-08-29 NOTE — Progress Notes (Signed)
Patient ID: Henry Ellis, male   DOB: February 20, 1956, 67 y.o.   MRN: [id number]        Chief Complaint: follow up HTN, HLD and hyperglycemia , [id number]       HPI:  Henry Ellis is a 67 y.o. male here overall doing ok, Pt denies chest pain, increased sob or doe, wheezing, orthopnea, PND, increased LE swelling, palpitations, dizziness or syncope.   Pt denies polydipsia, polyuria, or new focal neuro s/s.    Pt denies fever, wt loss, night sweats, loss of appetite, or other constitutional symptoms  Per [facility] nursing - A1c 5.5 on July 31.   Peak wt is now 315.  Due for flu shot today.  For RSV at the pharmacy Wt Readings from Last 3 Encounters:  08/29/23 (!) 315 lb (142.9 kg)  07/11/23 (!) 312 lb (141.5 kg)  06/13/23 (!) 312 lb (141.5 kg)   BP Readings from Last 3 Encounters:  08/29/23 126/78  04/02/23 (!) 146/93  02/26/23 130/78         Past Medical History:  Diagnosis Date   Allergy    seasonal allergies   Anemia    on meds   Anxiety    hx of   Arthritis    generalized   Barrett's esophagus    Cancer (HCC) 10/17/2016   kidney cancer(removed left kidney in 2017)   Carpal tunnel syndrome, right 04/21/2011   Chronic bronchitis    Chronic kidney disease 2017   2017-had left kidney removed   ED (erectile dysfunction)    Esophageal reflux    on meds   Esophageal stricture    Essential hypertension 10/19/2015   on meds   H/O adenomatous polyp of colon 06/05/2012   Hiatal hernia    High cholesterol    on meds   History of Bell's palsy    IBS (irritable bowel syndrome)    Personal history of colonic polyps 04/15/2008 & 04/22/2012   TUBULAR ADENOMA   Ulcerative esophagitis    Past Surgical History:  Procedure Laterality Date   CARPAL TUNNEL RELEASE     left   INGUINAL HERNIA REPAIR     bilateral   KNEE ARTHROSCOPY     right Dr Thomasena Edis   ROBOT ASSISTED LAPAROSCOPIC NEPHRECTOMY Left 10/18/2016   Procedure: XI ROBOTIC ASSISTED LAPAROSCOPIC RADICAL NEPHRECTOMY;  Surgeon: Sebastian Ache, MD;  Location: [facility];  Service: Urology;  Laterality: Left;   WISDOM TOOTH EXTRACTION      reports that he quit smoking about 6 years ago. His smoking use included cigarettes. He has never used smokeless tobacco. He reports current alcohol use of about 3.0 standard drinks of alcohol per week. He reports that he does not currently use drugs after having used the following drugs: Marijuana. Frequency: 2.00 times per week. family history includes Heart disease in his father; Hypertension in his brother; Lung cancer in his father. Allergies  Allergen Reactions   Bee Venom Anaphylaxis   Amoxicillin Rash    Did PCN reaction causing immediate rash, facial/tongue/throat swelling, SOB or lightheadedness with hypotension: no Did PCN reaction causing severe rash involving mucus membranes or skin necrosis: no Did PCN reaction that required hospitalization : no Did pcn reaction occurring within the last 10 years: no If all of the above answers are "NO", then may proceed with Cephalosporin use. Pt states he can take low doses of amoxicillin, has taken for dental procedures without reaction   Apixaban Itching   Aspirin Nausea Only  Other Rash    Aspirin cream    Current Outpatient Medications on File Prior to Visit  Medication Sig Dispense Refill   Cholecalciferol (VITAMIN D3 PO) Take 5,000 Units by mouth daily.     Cyanocobalamin (VITAMIN B-12 PO) Take by mouth daily.     docusate sodium (COLACE) 100 MG capsule Take 100 mg by mouth daily.     ferrous sulfate 325 (65 FE) MG tablet Take 325 mg by mouth daily with breakfast.     Psyllium 30.9 % POWD Take 1 packet by mouth daily.     rivaroxaban (XARELTO) 20 MG TABS tablet TAKE 1 TABLET(20 MG) BY MOUTH DAILY WITH SUPPER 90 tablet 1   No current facility-administered medications on file prior to visit.        ROS:  All others reviewed and negative.  Objective        PE:  BP 126/78 (BP Location: Right Arm, Patient Position: Sitting, Cuff  Size: Normal)   Pulse (!) 53   Temp 98.5 F (36.9 C) (Oral)   Ht 6\' 1"  (1.854 m)   Wt (!) 315 lb (142.9 kg)   SpO2 98%   BMI 41.56 kg/m                 Constitutional: Pt appears in NAD               HENT: Head: NCAT.                Right Ear: External ear normal.                 Left Ear: External ear normal.                Eyes: . Pupils are equal, round, and reactive to light. Conjunctivae and EOM are normal               Nose: without d/c or deformity               Neck: Neck supple. Gross normal ROM               Cardiovascular: Normal rate and regular rhythm.                 Pulmonary/Chest: Effort normal and breath sounds without rales or wheezing.                Abd:  Soft, NT, ND, + BS, no organomegaly               Neurological: Pt is alert. At baseline orientation, motor grossly intact               Skin: Skin is warm. No rashes, no other new lesions, LE edema - trace to 1+ bilateral               Psychiatric: Pt behavior is normal without agitation   Micro: none  Cardiac tracings I have personally interpreted today:  none  Pertinent Radiological findings (summarize): none   Lab Results  Component Value Date   WBC 5.0 02/22/2023   HGB 12.4 (L) 02/22/2023   HCT 37.0 (L) 02/22/2023   PLT 193.0 02/22/2023   GLUCOSE 99 08/23/2023   CHOL 97 08/23/2023   TRIG 71.0 08/23/2023   HDL 46.50 08/23/2023   LDLDIRECT 151.7 03/25/2010   LDLCALC 37 08/23/2023   ALT 14 08/23/2023   AST 13 08/23/2023   NA 136 08/23/2023   K 4.3 08/23/2023  CL 101 08/23/2023   CREATININE 1.32 08/23/2023   BUN 20 08/23/2023   CO2 30 08/23/2023   TSH 0.95 02/22/2023   PSA 1.31 02/22/2023   HGBA1C 6.2 08/23/2023   MICROALBUR 3.1 (H) 02/22/2023   Assessment/Plan:  Henry Ellis is a 67 y.o. White or Caucasian [1] male with  has a past medical history of Allergy, Anemia, Anxiety, Arthritis, Barrett's esophagus, Cancer (HCC) (10/17/2016), Carpal tunnel syndrome, right (04/21/2011),  Chronic bronchitis, Chronic kidney disease (2017), ED (erectile dysfunction), Esophageal reflux, Esophageal stricture, Essential hypertension (10/19/2015), H/O adenomatous polyp of colon (06/05/2012), Hiatal hernia, High cholesterol, History of Bell's palsy, IBS (irritable bowel syndrome), Personal history of colonic polyps (04/15/2008 & 04/22/2012), and Ulcerative esophagitis.  Peripheral edema Chronic stable, for compression stockings bialteral daily  CKD (chronic kidney disease) Lab Results  Component Value Date   CREATININE 1.32 08/23/2023   With gradual worsening, cont to avoid nephrotoxins, and increased farxiga 10 mg qd   Essential hypertension BP Readings from Last 3 Encounters:  08/29/23 126/78  04/02/23 (!) 146/93  02/26/23 130/78   Stable, pt to continue medical treatment norvasc 2.5 qd    HYPERCHOLESTEROLEMIA Lab Results  Component Value Date   LDLCALC 37 08/23/2023   Stable, pt to continue current statin lipitor 40 qd   Impaired glucose tolerance Lab Results  Component Value Date   HGBA1C 6.2 08/23/2023   Stable, pt to continue current medical treatment  - farxiga increased to 10 mg as above   B12 deficiency Lab Results  Component Value Date   VITAMINB12 683 08/23/2023   Stable, cont oral replacement - b12 1000 mcg qd   Vitamin D deficiency Last vitamin D Lab Results  Component Value Date   VD25OH 65.38 08/23/2023   Stable, cont oral replacement  Followup: Return in about 6 months (around 02/26/2024).  Oliver Barre, MD 09/01/2023 7:50 PM [facility]

## 2023-08-29 NOTE — Patient Instructions (Addendum)
Please have your RSV shot done at the pharmacy  You had the flu shot today  Ok to increase the farxiga to 10 mg to help preserve the kidneys  Please continue all other medications as before, and refills have been done if requested.  Please have the pharmacy call with any other refills you may need.  Please continue your efforts at being more active, low cholesterol diet, and weight control.  Please keep your appointments with your specialists as you may have planned  Please make an Appointment to return in 6 months, or sooner if needed, also with Lab Appointment for testing done 3-5 days before at the FIRST FLOOR Lab (so this is for TWO appointments - please see the scheduling desk as you leave)

## 2023-09-01 ENCOUNTER — Encounter: Payer: Self-pay | Admitting: Internal Medicine

## 2023-09-01 NOTE — Assessment & Plan Note (Signed)
Chronic stable, for compression stockings bialteral daily

## 2023-09-01 NOTE — Assessment & Plan Note (Signed)
Lab Results  Component Value Date   LDLCALC 37 08/23/2023   Stable, pt to continue current statin lipitor 40 qd

## 2023-09-01 NOTE — Assessment & Plan Note (Signed)
BP Readings from Last 3 Encounters:  08/29/23 126/78  04/02/23 (!) 146/93  02/26/23 130/78   Stable, pt to continue medical treatment norvasc 2.5 qd

## 2023-09-01 NOTE — Assessment & Plan Note (Addendum)
Lab Results  Component Value Date   CREATININE 1.32 08/23/2023   With gradual worsening, cont to avoid nephrotoxins, and increased farxiga 10 mg qd

## 2023-09-01 NOTE — Assessment & Plan Note (Signed)
Last vitamin D Lab Results  Component Value Date   VD25OH 65.38 08/23/2023   Stable, cont oral replacement

## 2023-09-01 NOTE — Assessment & Plan Note (Signed)
Lab Results  Component Value Date   HGBA1C 6.2 08/23/2023   Stable, pt to continue current medical treatment  - farxiga increased to 10 mg as above

## 2023-09-01 NOTE — Assessment & Plan Note (Signed)
Lab Results  Component Value Date   VITAMINB12 683 08/23/2023   Stable, cont oral replacement - b12 1000 mcg qd

## 2023-09-07 ENCOUNTER — Other Ambulatory Visit: Payer: Self-pay | Admitting: Internal Medicine

## 2023-09-07 DIAGNOSIS — I4892 Unspecified atrial flutter: Secondary | ICD-10-CM

## 2023-09-07 NOTE — Telephone Encounter (Signed)
Prescription refill request for Xarelto received.  Indication:aflutter Last office visit:6/24 Weight:142.9  kg Age:67 Scr:1.32  9/24 CrCl:106.75  ml/min  Prescription refilled

## 2023-09-11 DIAGNOSIS — R972 Elevated prostate specific antigen [PSA]: Secondary | ICD-10-CM | POA: Diagnosis not present

## 2023-09-18 DIAGNOSIS — C642 Malignant neoplasm of left kidney, except renal pelvis: Secondary | ICD-10-CM | POA: Diagnosis not present

## 2023-09-18 DIAGNOSIS — R972 Elevated prostate specific antigen [PSA]: Secondary | ICD-10-CM | POA: Diagnosis not present

## 2023-10-09 DIAGNOSIS — K080 Exfoliation of teeth due to systemic causes: Secondary | ICD-10-CM | POA: Diagnosis not present

## 2023-10-31 DIAGNOSIS — I739 Peripheral vascular disease, unspecified: Secondary | ICD-10-CM | POA: Diagnosis not present

## 2023-10-31 DIAGNOSIS — L600 Ingrowing nail: Secondary | ICD-10-CM | POA: Diagnosis not present

## 2023-11-19 ENCOUNTER — Ambulatory Visit: Payer: Medicare Other | Attending: Internal Medicine | Admitting: Internal Medicine

## 2023-11-19 ENCOUNTER — Encounter: Payer: Self-pay | Admitting: Internal Medicine

## 2023-11-19 VITALS — BP 130/70 | HR 59 | Ht 73.0 in | Wt 318.0 lb

## 2023-11-19 DIAGNOSIS — I4892 Unspecified atrial flutter: Secondary | ICD-10-CM | POA: Diagnosis not present

## 2023-11-19 DIAGNOSIS — N1831 Chronic kidney disease, stage 3a: Secondary | ICD-10-CM

## 2023-11-19 DIAGNOSIS — E785 Hyperlipidemia, unspecified: Secondary | ICD-10-CM

## 2023-11-19 DIAGNOSIS — R600 Localized edema: Secondary | ICD-10-CM

## 2023-11-19 DIAGNOSIS — J449 Chronic obstructive pulmonary disease, unspecified: Secondary | ICD-10-CM

## 2023-11-19 DIAGNOSIS — I2510 Atherosclerotic heart disease of native coronary artery without angina pectoris: Secondary | ICD-10-CM

## 2023-11-19 MED ORDER — TORSEMIDE 20 MG PO TABS: 20.0000 mg | ORAL_TABLET | ORAL | 3 refills | Status: DC

## 2023-11-19 NOTE — Progress Notes (Signed)
Cardiology Office Note:    Date:  11/19/2023   ID:  Ellis, Henry 06-12-1956, MRN [id number]  PCP:  Corwin Levins, MD    [facility]  Cardiologist:  Christell Constant, MD  Advanced Practice Provider:  No care team member to display Electrophysiologist:  None      CC: AFL f/u  History of Present Illness:    Henry Ellis is a 67 y.o. male with a hx of HTN, Aortic Atherosclerosis HLD, Distant smoking in 2017; morbid obesiity who presents for evaluation 03/11/21.  2022: In interim of this visit, patient had normal echo with stable leg swelling; he retired. 2023: Echo with normal LV function, mild LA dilation, had itching with eliquis and needed Xarelto 2024: Saw Tessa had no issues.  Henry Ellis, a 67 year old male with a history of hypertension, hyperlipidemia, aortic atherosclerosis, and morbid obesity, was initially seen in 2022 for preoperative risk stratification. He reported leg swelling and mild atrial dilation at that time. In 2023, he experienced an allergic reaction to Eliquis, presenting as itching, which resolved upon switching to Xarelto.  In June, he was evaluated for stable lower extremity edema. He has a single kidney and has been managing an elevated creatinine level. No additional testing or medication changes were made at that time.  The patient reports feeling well overall, with no chest pain, breathing difficulties, or irregular heartbeats. However, he does note persistent leg swelling. He has been active, walking regularly, and assisting a neighbor with caregiving.  The patient has a history of taking Lasix, a diuretic, but discontinued due to minimal urine output and discomfort. He also reports a history of overactive bladder, for which he was previously medicated.  The patient has been managing his weight, but notes that he has gained some recently, particularly in the legs. He also reports difficulty breathing when lying  flat, necessitating sleeping at an incline. He has been seen by a kidney specialist and a urologist annually, with blood work performed during these visits.  Past Medical History:  Diagnosis Date   Allergy    seasonal allergies   Anemia    on meds   Anxiety    hx of   Arthritis    generalized   Barrett's esophagus    Cancer (HCC) 10/17/2016   kidney cancer(removed left kidney in 2017)   Carpal tunnel syndrome, right 04/21/2011   Chronic bronchitis    Chronic kidney disease 2017   2017-had left kidney removed   ED (erectile dysfunction)    Esophageal reflux    on meds   Esophageal stricture    Essential hypertension 10/19/2015   on meds   H/O adenomatous polyp of colon 06/05/2012   Hiatal hernia    High cholesterol    on meds   History of Bell's palsy    IBS (irritable bowel syndrome)    Personal history of colonic polyps 04/15/2008 & 04/22/2012   TUBULAR ADENOMA   Ulcerative esophagitis     Past Surgical History:  Procedure Laterality Date   CARPAL TUNNEL RELEASE     left   INGUINAL HERNIA REPAIR     bilateral   KNEE ARTHROSCOPY     right Dr Thomasena Edis   ROBOT ASSISTED LAPAROSCOPIC NEPHRECTOMY Left 10/18/2016   Procedure: XI ROBOTIC ASSISTED LAPAROSCOPIC RADICAL NEPHRECTOMY;  Surgeon: Sebastian Ache, MD;  Location: [facility];  Service: Urology;  Laterality: Left;   WISDOM TOOTH EXTRACTION      Current Medications: Current  Meds  Medication Sig   amLODipine (NORVASC) 2.5 MG tablet Take 1 tablet (2.5 mg total) by mouth daily.   atorvastatin (LIPITOR) 40 MG tablet Take 1 tablet (40 mg total) by mouth daily.   Cholecalciferol (VITAMIN D3 PO) Take 5,000 Units by mouth daily.   Cyanocobalamin (VITAMIN B-12 PO) Take by mouth daily.   dapagliflozin propanediol (FARXIGA) 10 MG TABS tablet Take 1 tablet (10 mg total) by mouth daily before breakfast.   docusate sodium (COLACE) 100 MG capsule Take 100 mg by mouth daily.   esomeprazole (NEXIUM) 20 MG capsule Take 1 capsule (20 mg  total) by mouth daily at 12 noon.   ferrous sulfate 325 (65 FE) MG tablet Take 325 mg by mouth daily with breakfast.   finasteride (PROSCAR) 5 MG tablet Take 1 tablet (5 mg total) by mouth daily.   Psyllium 30.9 % POWD Take 1 packet by mouth daily.   solifenacin (VESICARE) 5 MG tablet Take 1 tablet (5 mg total) by mouth daily.   torsemide (DEMADEX) 20 MG tablet Take 1 tablet (20 mg total) by mouth every other day.   XARELTO 20 MG TABS tablet TAKE 1 TABLET(20 MG) BY MOUTH DAILY WITH SUPPER     Allergies:   Bee venom, Amoxicillin, Apixaban, Aspirin, and Other   Social History   Socioeconomic History   Marital status: Single    Spouse name: Not on file   Number of children: Not on file   Years of education: Not on file   Highest education level: Not on file  Occupational History   Occupation: Curator    Employer: [employer]  Tobacco Use   Smoking status: Former    Current packs/day: 0.00    Types: Cigarettes    Quit date: 10/17/2016    Years since quitting: 7.0   Smokeless tobacco: Never   Tobacco comments:    Counseling sheet given 03-2012   Vaping Use   Vaping status: Former  Substance and Sexual Activity   Alcohol use: Yes    Alcohol/week: 3.0 standard drinks of alcohol    Types: 3 Shots of liquor per week   Drug use: Not Currently    Frequency: 2.0 times per week    Types: Marijuana    Comment: last use June 05, 2016   Sexual activity: Not on file  Other Topics Concern   Not on file  Social History Narrative   Not on file   Social Determinants of Health   Financial Resource Strain: Low Risk  (07/11/2023)   Overall Financial Resource Strain (CARDIA)    Difficulty of Paying Living Expenses: Not hard at all  Food Insecurity: No Food Insecurity (07/11/2023)   Hunger Vital Sign    Worried About Running Out of Food in the Last Year: Never true    Ran Out of Food in the Last Year: Never true  Transportation Needs: No Transportation Needs (07/11/2023)    PRAPARE - Administrator, Civil Service (Medical): No    Lack of Transportation (Non-Medical): No  Physical Activity: Sufficiently Active (07/11/2023)   Exercise Vital Sign    Days of Exercise per Week: 5 days    Minutes of Exercise per Session: 30 min  Stress: No Stress Concern Present (07/11/2023)   [employer] of Occupational Health - Occupational Stress Questionnaire    Feeling of Stress : Not at all  Social Connections: Socially Isolated (07/11/2023)   Social Connection and Isolation Panel [NHANES]    Frequency of  Communication with Friends and Family: Three times a week    Frequency of Social Gatherings with Friends and Family: Three times a week    Attends Religious Services: Never    Active Member of Clubs or Organizations: No    Attends Engineer, structural: Never    Marital Status: Never married    Social:  Retired; goes to [employer] with his nieces and nephews every summer (may stop as his sister had health problems)  Family History: The patient's family history includes Heart disease in his father; Hypertension in his brother; Lung cancer in his father. There is no history of Colon cancer, Stomach cancer, Esophageal cancer, Rectal cancer, or Colon polyps.  History of coronary artery disease notable for father and oldest brother. History of heart failure notable for no members. History of arrhythmia notable for sister has pacemaker.  ROS:   Please see the history of present illness.     EKGs/Labs/Other Studies Reviewed:    The following studies were reviewed today:  EKG:   09/11/22: AFL rare PVC rate 60 03/16/22:  AFL rare PVC 02/18/21: SR 1st HB rate 82   Cardiac Studies & Procedures       ECHOCARDIOGRAM  ECHOCARDIOGRAM COMPLETE 04/02/2023  Narrative ECHOCARDIOGRAM REPORT    Patient Name:   Henry Ellis [facility] Date of Exam: 04/02/2023 Medical Rec #:  [id number]         Height:       73.0 in Accession #:    [id number]        Weight:        315.0 lb Date of Birth:  1956-05-04         BSA:          2.610 m Patient Age:    66 years          BP:           146/93 mmHg Patient Gender: M                 HR:           71 bpm. Exam Location:  [address]  Procedure: 2D Echo, Cardiac Doppler, Color Doppler and Intracardiac Opacification Agent  Indications:    I48.91 Atrial Flutter  History:        Patient has prior history of Echocardiogram examinations, most recent 04/03/2022. CKD; Risk Factors:Hypertension and HLD.  Sonographer:    Clearence Ped RCS Referring Phys: [id number] Hamish Banks A Zykerria Tanton  IMPRESSIONS   1. Left ventricular ejection fraction, by estimation, is 60 to 65%. The left ventricle has normal function. The left ventricle has no regional wall motion abnormalities. There is mild left ventricular hypertrophy. Left ventricular diastolic parameters are indeterminate. 2. Right ventricular systolic function is normal. The right ventricular size is normal. There is normal pulmonary artery systolic pressure. 3. The mitral valve is normal in structure. No evidence of mitral valve regurgitation. No evidence of mitral stenosis. 4. The aortic valve is tricuspid. Aortic valve regurgitation is not visualized. No aortic stenosis is present. 5. The inferior vena cava is normal in size with greater than 50% respiratory variability, suggesting right atrial pressure of 3 mmHg.  Comparison(s): No significant change from prior study. Prior images reviewed side by side.  FINDINGS Left Ventricle: Left ventricular ejection fraction, by estimation, is 60 to 65%. The left ventricle has normal function. The left ventricle has no regional wall motion abnormalities. The left ventricular internal cavity size was normal in size. There is  mild left ventricular hypertrophy. Left ventricular diastolic parameters are indeterminate.  Right Ventricle: The right ventricular size is normal. No increase in right ventricular wall thickness. Right  ventricular systolic function is normal. There is normal pulmonary artery systolic pressure. The tricuspid regurgitant velocity is 1.33 m/s, and with an assumed right atrial pressure of 3 mmHg, the estimated right ventricular systolic pressure is 10.1 mmHg.  Left Atrium: Left atrial size was normal in size.  Right Atrium: Right atrial size was normal in size.  Pericardium: Trivial pericardial effusion is present. The pericardial effusion is posterior to the left ventricle.  Mitral Valve: The mitral valve is normal in structure. No evidence of mitral valve regurgitation. No evidence of mitral valve stenosis.  Tricuspid Valve: The tricuspid valve is normal in structure. Tricuspid valve regurgitation is not demonstrated. No evidence of tricuspid stenosis.  Aortic Valve: The aortic valve is tricuspid. Aortic valve regurgitation is not visualized. No aortic stenosis is present.  Pulmonic Valve: The pulmonic valve was normal in structure. Pulmonic valve regurgitation is trivial. No evidence of pulmonic stenosis.  Aorta: The aortic root is normal in size and structure.  Venous: The inferior vena cava is normal in size with greater than 50% respiratory variability, suggesting right atrial pressure of 3 mmHg.  IAS/Shunts: No atrial level shunt detected by color flow Doppler.   LEFT VENTRICLE PLAX 2D LVIDd:         5.20 cm   Diastology LVIDs:         3.70 cm   LV e' medial:    14.60 cm/s LV PW:         1.30 cm   LV E/e' medial:  8.0 LV IVS:        1.10 cm   LV e' lateral:   18.90 cm/s LVOT diam:     2.20 cm   LV E/e' lateral: 6.2 LV SV:         65 LV SV Index:   25 LVOT Area:     3.80 cm   RIGHT VENTRICLE RVSP:           10.1 mmHg  LEFT ATRIUM             Index        RIGHT ATRIUM LA diam:        4.70 cm 1.80 cm/m   RA Pressure: 3.00 mmHg LA Vol (A2C):   78.7 ml 30.16 ml/m LA Vol (A4C):   72.3 ml 27.70 ml/m LA Biplane Vol: 79.0 ml 30.27 ml/m AORTIC VALVE LVOT Vmax:   82.20  cm/s LVOT Vmean:  48.900 cm/s LVOT VTI:    0.170 m  AORTA Ao Root diam: 3.40 cm Ao Asc diam:  3.60 cm  MITRAL VALVE                TRICUSPID VALVE MV Area (PHT):              TR Peak grad:   7.1 mmHg MV Decel Time:              TR Vmax:        133.00 cm/s MV E velocity: 116.50 cm/s  Estimated RAP:  3.00 mmHg RVSP:           10.1 mmHg  SHUNTS Systemic VTI:  0.17 m Systemic Diam: 2.20 cm  Donato Schultz MD Electronically signed by Donato Schultz MD Signature Date/Time: 04/02/2023/12:22:30 PM    Final     CT SCANS  CT CARDIAC  SCORING (SELF PAY ONLY) 09/20/2022  Addendum 09/20/2022 12:31 PM ADDENDUM REPORT: 09/20/2022 12:29  EXAM: OVER-READ INTERPRETATION CARDIAC CT CHEST  The following report is an over-read performed by radiologist Dr. Gaylyn Rong of [facility] on 09/20/2022. This over-read does not include interpretation of cardiac or coronary anatomy or pathology. The coronary calcium score interpretation by the cardiologist is attached.  COMPARISON:  Chest CT 11/28/2021  FINDINGS: Extracardiac Vascular: Unremarkable  Mediastinum: Unremarkable  Lung: Unremarkable  Included Upper Abdomen: Fluid density lesion in the dome of the right hepatic lobe compatible with cyst.  Musculoskeletal: Lower thoracic spondylosis. Gynecomastia.  IMPRESSION: 1. Right hepatic lobe cyst. 2. Gynecomastia.   Electronically Signed By: Gaylyn Rong M.D. On: 09/20/2022 12:29  Narrative : Cardiovascular Disease Risk stratification  EXAM: Coronary Calcium Score  TECHNIQUE: A gated, non-contrast computed tomography scan of the heart was performed using 3mm slice thickness. Axial images were analyzed on a dedicated workstation. Calcium scoring of the coronary arteries was performed using the Agatston method.  FINDINGS: Coronary arteries: Normal origins.  Coronary Calcium Score:  Left main: 0  Left anterior descending artery: 0.69  Left  circumflex artery: 0  Right coronary artery: 0  Total: 0.69  Percentile: 18  Pericardium: Normal.  Ascending Aorta: Normal caliber.  Mild to moderate mitral annular calcification  Non-cardiac: See separate report from [facility].  IMPRESSION: Coronary calcium score of 0.69. This was 18 percentile for age-, race-, and sex-matched controls.  RECOMMENDATIONS: Coronary artery calcium (CAC) score is a strong predictor of incident coronary heart disease (CHD) and provides predictive information beyond traditional risk factors. CAC scoring is reasonable to use in the decision to withhold, postpone, or initiate statin therapy in intermediate-risk or selected borderline-risk asymptomatic adults (age 70-75 years and LDL-C >=70 to <190 mg/dL) who do not have diabetes or established atherosclerotic cardiovascular disease (ASCVD).* In intermediate-risk (10-year ASCVD risk >=7.5% to <20%) adults or selected borderline-risk (10-year ASCVD risk >=5% to <7.5%) adults in whom a CAC score is measured for the purpose of making a treatment decision the following recommendations have been made:  If CAC=0, it is reasonable to withhold statin therapy and reassess in 5 to 10 years, as long as higher risk conditions are absent (diabetes mellitus, family history of premature CHD in first degree relatives (males <55 years; females <65 years), cigarette smoking, or LDL >=190 mg/dL).  If CAC is 1 to 99, it is reasonable to initiate statin therapy for patients >=31 years of age.  If CAC is >=100 or >=75th percentile, it is reasonable to initiate statin therapy at any age.  Cardiology referral should be considered for patients with CAC scores >=400 or >=75th percentile.  *2018 AHA/ACC/AACVPR/AAPA/ABC/ACPM/ADA/AGS/APhA/ASPC/NLA/PCNA Guideline on the Management of Blood Cholesterol: A Report of the American College of Cardiology/American Heart Association Task Force on Clinical Practice  Guidelines. J Am Coll Cardiol. 2019;73(24):3168-3209.  Thomasene Ripple, DO [facility]  The noncardiac portion of this study will be interpreted in separate report by the radiologist.  Electronically Signed: By: Thomasene Ripple D.O. On: 09/20/2022 11:47           NonCardiac CT : Date: 10/01/20 Results: Aortic Atherosclerosis without CAC  NM Stress Testing: Date: 04/05/2012 Results:  Negative per report  Recent Labs: 02/22/2023: Hemoglobin 12.4; Platelets 193.0; TSH 0.95 08/23/2023: ALT 14; BUN 20; Creatinine, Ser 1.32; Potassium 4.3; Sodium 136  Recent Lipid Panel    Component Value Date/Time   CHOL 97 08/23/2023 0748   TRIG 71.0 08/23/2023 0748  HDL 46.50 08/23/2023 0748   CHOLHDL 2 08/23/2023 0748   VLDL 14.2 08/23/2023 0748   LDLCALC 37 08/23/2023 0748   LDLDIRECT 151.7 03/25/2010 1108    Physical Exam:    VS:  BP 130/70   Pulse (!) 59   Ht 6\' 1"  (1.854 m)   Wt (!) 318 lb (144.2 kg)   SpO2 97%   BMI 41.96 kg/m     Wt Readings from Last 3 Encounters:  11/19/23 (!) 318 lb (144.2 kg)  08/29/23 (!) 315 lb (142.9 kg)  07/11/23 (!) 312 lb (141.5 kg)    Gen: no distress, morbid obesity   Neck: No JVD, thick neck Cardiac: No Rubs or Gallops, no Murmur, RRR distant heart sounds  Respiratory: Clear to auscultation bilaterally, normal effort, normal  respiratory rate GI: Soft, nontender, non-distended  MS: Trace edema;  moves all extremities Integument: Skin feels well Neuro:  At time of evaluation, alert and oriented to person/place/time/situation  Psych: Normal affect, patient feels fine  ASSESSMENT:    1. Atrial flutter, unspecified type (HCC)   2. Hyperlipidemia LDL goal <70   3. Coronary artery calcification   4. Chronic obstructive pulmonary disease, unspecified COPD type (HCC)   5. Stage 3a chronic kidney disease (HCC)   6. Bilateral lower extremity edema      PLAN:     Atrial Flutter - Permanent atrial flutter, asymptomatic with normal heart function. No  recent chest pain, dyspnea, or palpitations. Current management maintained due to stable condition.- Continue current management - CHADSVASC 2+, continue DOAC and current therapy  Lower Extremity Edema Chronic Kidney Disease IIIa - Chronic lower extremity edema likely due to fluid retention. Difficulty with diuretics causing bladder discomfort and minimal urine output. Plan to trial torsemide to manage fluid retention. Discussed risks of fluid overload and benefits of improved edema management. Agreed to trial torsemide every other day and monitor for improvement. - Prescribe torsemide 20 mg Q 48 hours - Monitor for improvement in edema and symptoms of fluid overload - Check labs in a couple of weeks to monitor kidney function (BMP and BNP)  Hypertension - Well-controlled hypertension. No recent changes in medication or blood pressure readings.  Aortic Atherosclerosis with coronary calcifications Hyperlipidemia - Well-managed hyperlipidemia with improved cholesterol levels. - Continue current lipid-lowering therapy  Morbid Obesity - Encourage regular physical activity and healthy diet  Follow-up - Schedule follow-up with Tessa (PA-C) in the spring - Schedule follow-up with me in the fall      Medication Adjustments/Labs and Tests Ordered: Current medicines are reviewed at length with the patient today.  Concerns regarding medicines are outlined above.  Orders Placed This Encounter  Procedures   Basic metabolic panel   Pro b natriuretic peptide (BNP)   EKG 12-Lead    Meds ordered this encounter  Medications   torsemide (DEMADEX) 20 MG tablet    Sig: Take 1 tablet (20 mg total) by mouth every other day.    Dispense:  45 tablet    Refill:  3     Patient Instructions  Medication Instructions:  Your physician has recommended you make the following change in your medication:  START: Torsemide 20 mg by mouth every other day   *If you need a refill on your cardiac  medications before your next appointment, please call your pharmacy*   Lab Work: IN 2 WEEKS: BMP, BNP  If you have labs (blood work) drawn today and your tests are completely normal, you will receive your results  only by: [employer] (if you have MyChart) OR A paper copy in the mail If you have any lab test that is abnormal or we need to change your treatment, we will call you to review the results.   Testing/Procedures: NONE   Follow-Up: At [facility], you and your health needs are our priority.  As part of our continuing mission to provide you with exceptional heart care, we have created designated Provider Care Teams.  These Care Teams include your primary Cardiologist (physician) and Advanced Practice Providers (APPs -  Physician Assistants and Nurse Practitioners) who all work together to provide you with the care you need, when you need it.  We recommend signing up for the patient portal called "MyChart".  Sign up information is provided on this After Visit Summary.  MyChart is used to connect with patients for Virtual Visits (Telemedicine).  Patients are able to view lab/test results, encounter notes, upcoming appointments, etc.  Non-urgent messages can be sent to your provider as well.   To learn more about what you can do with MyChart, go to [email or web address].    Your next appointment:   4 month(s)  Provider:   Jari Favre, PA-C     Then, Christell Constant, MD will plan to see you again in 10 month(s).   Signed, Christell Constant, MD  11/19/2023 10:09 AM    [facility]

## 2023-11-19 NOTE — Patient Instructions (Signed)
Medication Instructions:  Your physician has recommended you make the following change in your medication:  START: Torsemide 20 mg by mouth every other day   *If you need a refill on your cardiac medications before your next appointment, please call your pharmacy*   Lab Work: IN 2 WEEKS: BMP, BNP  If you have labs (blood work) drawn today and your tests are completely normal, you will receive your results only by: MyChart Message (if you have MyChart) OR A paper copy in the mail If you have any lab test that is abnormal or we need to change your treatment, we will call you to review the results.   Testing/Procedures: NONE   Follow-Up: At [facility], you and your health needs are our priority.  As part of our continuing mission to provide you with exceptional heart care, we have created designated Provider Care Teams.  These Care Teams include your primary Cardiologist (physician) and Advanced Practice Providers (APPs -  Physician Assistants and Nurse Practitioners) who all work together to provide you with the care you need, when you need it.  We recommend signing up for the patient portal called "MyChart".  Sign up information is provided on this After Visit Summary.  MyChart is used to connect with patients for Virtual Visits (Telemedicine).  Patients are able to view lab/test results, encounter notes, upcoming appointments, etc.  Non-urgent messages can be sent to your provider as well.   To learn more about what you can do with MyChart, go to [email or web address].    Your next appointment:   4 month(s)  Provider:   Jari Favre, PA-C     Then, Christell Constant, MD will plan to see you again in 10 month(s).

## 2023-11-30 ENCOUNTER — Ambulatory Visit (HOSPITAL_COMMUNITY)
Admission: RE | Admit: 2023-11-30 | Discharge: 2023-11-30 | Disposition: A | Discharge status: Home / Self Care | Payer: Medicare Other | Source: Ambulatory Visit | Attending: *Deleted | Admitting: *Deleted

## 2023-11-30 DIAGNOSIS — Z87891 Personal history of nicotine dependence: Secondary | ICD-10-CM | POA: Diagnosis not present

## 2023-11-30 DIAGNOSIS — Z122 Encounter for screening for malignant neoplasm of respiratory organs: Secondary | ICD-10-CM | POA: Diagnosis not present

## 2023-11-30 DIAGNOSIS — J432 Centrilobular emphysema: Secondary | ICD-10-CM | POA: Diagnosis not present

## 2023-11-30 DIAGNOSIS — I2510 Atherosclerotic heart disease of native coronary artery without angina pectoris: Secondary | ICD-10-CM | POA: Insufficient documentation

## 2023-11-30 DIAGNOSIS — I700 Atherosclerosis of aorta: Secondary | ICD-10-CM | POA: Diagnosis not present

## 2023-12-04 DIAGNOSIS — N1831 Chronic kidney disease, stage 3a: Secondary | ICD-10-CM | POA: Diagnosis not present

## 2023-12-04 DIAGNOSIS — R600 Localized edema: Secondary | ICD-10-CM | POA: Diagnosis not present

## 2023-12-04 LAB — BASIC METABOLIC PANEL
BUN/Creatinine Ratio: 17 (ref 10–24)
BUN: 25 mg/dL (ref 8–27)
CO2: 25 mmol/L (ref 20–29)
Calcium: 9.4 mg/dL (ref 8.6–10.2)
Chloride: 96 mmol/L (ref 96–106)
Creatinine, Ser: 1.5 mg/dL — ABNORMAL HIGH (ref 0.76–1.27)
Glucose: 116 mg/dL — ABNORMAL HIGH (ref 70–99)
Potassium: 4.7 mmol/L (ref 3.5–5.2)
Sodium: 137 mmol/L (ref 134–144)
eGFR: 50 mL/min/{1.73_m2} — ABNORMAL LOW (ref >59)

## 2023-12-04 LAB — PRO B NATRIURETIC PEPTIDE: NT-Pro BNP: 4028 pg/mL — ABNORMAL HIGH (ref 0–376)

## 2023-12-07 ENCOUNTER — Other Ambulatory Visit: Payer: Self-pay

## 2023-12-07 ENCOUNTER — Telehealth: Payer: Self-pay

## 2023-12-07 DIAGNOSIS — N1831 Chronic kidney disease, stage 3a: Secondary | ICD-10-CM

## 2023-12-07 MED ORDER — TORSEMIDE 20 MG PO TABS: 20.0000 mg | ORAL_TABLET | Freq: Every day | ORAL | 3 refills | Status: AC

## 2023-12-07 NOTE — Telephone Encounter (Signed)
The patient has been notified of the result and verbalized understanding.  All questions (if any) were answered. Macie Burows, RN 12/07/2023 5:26 PM    Pt would like to remind MD that only has 1 kidney since 2017.  Advised pt if has any trouble with increasing Torsemide to daily dosing to call our office.  Pt advised of f/u labs due in 2 weeks. Will send to MD as an FYI.

## 2023-12-07 NOTE — Telephone Encounter (Signed)
-----   Message from Christell Constant sent at 12/05/2023  2:58 PM EST ----- Results: BNP is elevated Creatinine is increased by similar to prior Plan: Torsemide dose to daily and BMP in 2 weeks  Christell Constant, MD

## 2023-12-13 ENCOUNTER — Other Ambulatory Visit: Payer: Self-pay | Admitting: Acute Care

## 2023-12-13 DIAGNOSIS — Z87891 Personal history of nicotine dependence: Secondary | ICD-10-CM

## 2023-12-13 DIAGNOSIS — Z122 Encounter for screening for malignant neoplasm of respiratory organs: Secondary | ICD-10-CM

## 2023-12-25 DIAGNOSIS — N1831 Chronic kidney disease, stage 3a: Secondary | ICD-10-CM | POA: Diagnosis not present

## 2023-12-25 LAB — BASIC METABOLIC PANEL
BUN/Creatinine Ratio: 18 (ref 10–24)
BUN: 28 mg/dL — ABNORMAL HIGH (ref 8–27)
CO2: 22 mmol/L (ref 20–29)
Calcium: 9.1 mg/dL (ref 8.6–10.2)
Chloride: 98 mmol/L (ref 96–106)
Creatinine, Ser: 1.57 mg/dL — ABNORMAL HIGH (ref 0.76–1.27)
Glucose: 121 mg/dL — ABNORMAL HIGH (ref 70–99)
Potassium: 4.4 mmol/L (ref 3.5–5.2)
Sodium: 139 mmol/L (ref 134–144)
eGFR: 47 mL/min/{1.73_m2} — ABNORMAL LOW (ref >59)

## 2024-01-15 DIAGNOSIS — N183 Chronic kidney disease, stage 3 unspecified: Secondary | ICD-10-CM | POA: Diagnosis not present

## 2024-01-30 DIAGNOSIS — I739 Peripheral vascular disease, unspecified: Secondary | ICD-10-CM | POA: Diagnosis not present

## 2024-01-30 DIAGNOSIS — L603 Nail dystrophy: Secondary | ICD-10-CM | POA: Diagnosis not present

## 2024-01-31 DIAGNOSIS — K080 Exfoliation of teeth due to systemic causes: Secondary | ICD-10-CM | POA: Diagnosis not present

## 2024-02-04 DIAGNOSIS — I129 Hypertensive chronic kidney disease with stage 1 through stage 4 chronic kidney disease, or unspecified chronic kidney disease: Secondary | ICD-10-CM | POA: Diagnosis not present

## 2024-02-04 DIAGNOSIS — E1122 Type 2 diabetes mellitus with diabetic chronic kidney disease: Secondary | ICD-10-CM | POA: Diagnosis not present

## 2024-02-04 DIAGNOSIS — I4891 Unspecified atrial fibrillation: Secondary | ICD-10-CM | POA: Diagnosis not present

## 2024-02-04 DIAGNOSIS — N183 Chronic kidney disease, stage 3 unspecified: Secondary | ICD-10-CM | POA: Diagnosis not present

## 2024-02-04 DIAGNOSIS — I10 Essential (primary) hypertension: Secondary | ICD-10-CM | POA: Diagnosis not present

## 2024-02-04 DIAGNOSIS — E119 Type 2 diabetes mellitus without complications: Secondary | ICD-10-CM | POA: Diagnosis not present

## 2024-02-20 ENCOUNTER — Other Ambulatory Visit: Payer: Medicare Other

## 2024-02-20 ENCOUNTER — Other Ambulatory Visit: Payer: Medicare Other | Admitting: Internal Medicine

## 2024-02-20 DIAGNOSIS — E7800 Pure hypercholesterolemia, unspecified: Secondary | ICD-10-CM

## 2024-02-20 DIAGNOSIS — E538 Deficiency of other specified B group vitamins: Secondary | ICD-10-CM

## 2024-02-20 DIAGNOSIS — R7302 Impaired glucose tolerance (oral): Secondary | ICD-10-CM

## 2024-02-20 DIAGNOSIS — E559 Vitamin D deficiency, unspecified: Secondary | ICD-10-CM | POA: Diagnosis not present

## 2024-02-20 DIAGNOSIS — Z125 Encounter for screening for malignant neoplasm of prostate: Secondary | ICD-10-CM

## 2024-02-20 LAB — CBC WITH DIFFERENTIAL/PLATELET
Basophils Absolute: 0.1 10*3/uL (ref 0.0–0.1)
Basophils Relative: 1.2 % (ref 0.0–3.0)
Eosinophils Absolute: 0.1 10*3/uL (ref 0.0–0.7)
Eosinophils Relative: 1.3 % (ref 0.0–5.0)
HCT: 36.2 % — ABNORMAL LOW (ref 39.0–52.0)
Hemoglobin: 12.1 g/dL — ABNORMAL LOW (ref 13.0–17.0)
Lymphocytes Relative: 19.3 % (ref 12.0–46.0)
Lymphs Abs: 0.8 10*3/uL (ref 0.7–4.0)
MCHC: 33.4 g/dL (ref 30.0–36.0)
MCV: 104.2 fl — ABNORMAL HIGH (ref 78.0–100.0)
Monocytes Absolute: 0.4 10*3/uL (ref 0.1–1.0)
Monocytes Relative: 9.1 % (ref 3.0–12.0)
Neutro Abs: 3 10*3/uL (ref 1.4–7.7)
Neutrophils Relative %: 69.1 % (ref 43.0–77.0)
Platelets: 181 10*3/uL (ref 150.0–400.0)
RBC: 3.47 Mil/uL — ABNORMAL LOW (ref 4.22–5.81)
RDW: 14.8 % (ref 11.5–15.5)
WBC: 4.4 10*3/uL (ref 4.0–10.5)

## 2024-02-20 LAB — PSA: PSA: 1.45 ng/mL (ref 0.10–4.00)

## 2024-02-20 LAB — BASIC METABOLIC PANEL
BUN: 24 mg/dL — ABNORMAL HIGH (ref 6–23)
CO2: 30 meq/L (ref 19–32)
Calcium: 8.8 mg/dL (ref 8.4–10.5)
Chloride: 99 meq/L (ref 96–112)
Creatinine, Ser: 1.52 mg/dL — ABNORMAL HIGH (ref 0.40–1.50)
GFR: 46.4 mL/min — ABNORMAL LOW (ref >60.00)
Glucose, Bld: 111 mg/dL — ABNORMAL HIGH (ref 70–99)
Potassium: 3.7 meq/L (ref 3.5–5.1)
Sodium: 135 meq/L (ref 135–145)

## 2024-02-20 LAB — HEMOGLOBIN A1C: Hgb A1c MFr Bld: 6.1 % (ref 4.6–6.5)

## 2024-02-20 LAB — HEPATIC FUNCTION PANEL
ALT: 16 U/L (ref 0–53)
AST: 13 U/L (ref 0–37)
Albumin: 4.1 g/dL (ref 3.5–5.2)
Alkaline Phosphatase: 89 U/L (ref 39–117)
Bilirubin, Direct: 0.2 mg/dL (ref 0.0–0.3)
Total Bilirubin: 0.9 mg/dL (ref 0.2–1.2)
Total Protein: 7 g/dL (ref 6.0–8.3)

## 2024-02-20 LAB — MICROALBUMIN / CREATININE URINE RATIO
Creatinine,U: 33.7 mg/dL
Microalb Creat Ratio: 78.2 mg/g — ABNORMAL HIGH (ref 0.0–30.0)
Microalb, Ur: 2.6 mg/dL — ABNORMAL HIGH (ref 0.0–1.9)

## 2024-02-20 LAB — URINALYSIS, ROUTINE W REFLEX MICROSCOPIC
Bilirubin Urine: NEGATIVE
Hgb urine dipstick: NEGATIVE
Ketones, ur: NEGATIVE
Leukocytes,Ua: NEGATIVE
Nitrite: NEGATIVE
RBC / HPF: NONE SEEN (ref 0–?)
Specific Gravity, Urine: <=1.005 — AB (ref 1.000–1.030)
Total Protein, Urine: NEGATIVE
Urine Glucose: 500 — AB
Urobilinogen, UA: 0.2 (ref 0.0–1.0)
WBC, UA: NONE SEEN (ref 0–?)
pH: 6 (ref 5.0–8.0)

## 2024-02-20 LAB — TSH: TSH: 0.9 u[IU]/mL (ref 0.35–5.50)

## 2024-02-20 LAB — LIPID PANEL
Cholesterol: 112 mg/dL (ref 0–200)
HDL: 52.4 mg/dL (ref >39.00)
LDL Cholesterol: 41 mg/dL (ref 0–99)
NonHDL: 59.39
Total CHOL/HDL Ratio: 2
Triglycerides: 92 mg/dL (ref 0.0–149.0)
VLDL: 18.4 mg/dL (ref 0.0–40.0)

## 2024-02-20 LAB — VITAMIN D 25 HYDROXY (VIT D DEFICIENCY, FRACTURES): VITD: 53.77 ng/mL (ref 30.00–100.00)

## 2024-02-20 LAB — VITAMIN B12: Vitamin B-12: 857 pg/mL (ref 211–911)

## 2024-02-26 ENCOUNTER — Ambulatory Visit (INDEPENDENT_AMBULATORY_CARE_PROVIDER_SITE_OTHER): Payer: Medicare Other | Admitting: Internal Medicine

## 2024-02-26 ENCOUNTER — Encounter: Payer: Self-pay | Admitting: Internal Medicine

## 2024-02-26 VITALS — BP 126/84 | HR 49 | Temp 98.3°F | Ht 73.0 in | Wt 313.0 lb

## 2024-02-26 DIAGNOSIS — R7302 Impaired glucose tolerance (oral): Secondary | ICD-10-CM | POA: Diagnosis not present

## 2024-02-26 DIAGNOSIS — Z0001 Encounter for general adult medical examination with abnormal findings: Secondary | ICD-10-CM

## 2024-02-26 DIAGNOSIS — E7800 Pure hypercholesterolemia, unspecified: Secondary | ICD-10-CM

## 2024-02-26 DIAGNOSIS — I10 Essential (primary) hypertension: Secondary | ICD-10-CM

## 2024-02-26 DIAGNOSIS — K1379 Other lesions of oral mucosa: Secondary | ICD-10-CM | POA: Insufficient documentation

## 2024-02-26 DIAGNOSIS — I2510 Atherosclerotic heart disease of native coronary artery without angina pectoris: Secondary | ICD-10-CM | POA: Insufficient documentation

## 2024-02-26 DIAGNOSIS — N1831 Chronic kidney disease, stage 3a: Secondary | ICD-10-CM | POA: Diagnosis not present

## 2024-02-26 DIAGNOSIS — Z0000 Encounter for general adult medical examination without abnormal findings: Secondary | ICD-10-CM

## 2024-02-26 DIAGNOSIS — E559 Vitamin D deficiency, unspecified: Secondary | ICD-10-CM

## 2024-02-26 MED ORDER — ATORVASTATIN CALCIUM 40 MG PO TABS
40.0000 mg | ORAL_TABLET | Freq: Every day | ORAL | 3 refills | Status: AC
Start: 2024-02-26 — End: 2025-02-20

## 2024-02-26 MED ORDER — LOSARTAN POTASSIUM 50 MG PO TABS: 50.0000 mg | ORAL_TABLET | Freq: Every day | ORAL | 3 refills | Status: AC

## 2024-02-26 MED ORDER — SOLIFENACIN SUCCINATE 5 MG PO TABS: 5.0000 mg | ORAL_TABLET | Freq: Every day | ORAL | 3 refills | Status: AC

## 2024-02-26 MED ORDER — DAPAGLIFLOZIN PROPANEDIOL 10 MG PO TABS: 10.0000 mg | ORAL_TABLET | Freq: Every day | ORAL | 3 refills | Status: AC

## 2024-02-26 MED ORDER — ESOMEPRAZOLE MAGNESIUM 20 MG PO CPDR: 20.0000 mg | DELAYED_RELEASE_CAPSULE | Freq: Every day | ORAL | 3 refills | Status: DC

## 2024-02-26 MED ORDER — FINASTERIDE 5 MG PO TABS: 5.0000 mg | ORAL_TABLET | Freq: Every day | ORAL | 3 refills | Status: AC

## 2024-02-26 NOTE — Assessment & Plan Note (Signed)
 Lab Results  Component Value Date   HGBA1C 6.1 02/20/2024   Stable, pt to continue current medical treatment farxiga 10 qd

## 2024-02-26 NOTE — Assessment & Plan Note (Signed)
 Lab Results  Component Value Date   LDLCALC 41 02/20/2024   Stable, pt to continue current statin lipitor 40 mg

## 2024-02-26 NOTE — Progress Notes (Signed)
 Patient ID: Henry Ellis, male   DOB: 1956/06/03, 68 y.o.   MRN: [id number]         Chief Complaint:: wellness exam and Medical Management of Chronic Issues (6 month follow up, says the roof of his mouth bleeds in the mornings )  , microalbuminuria and htn, ckd, hld, mouth bleeding       HPI:  Henry Ellis is a 68 y.o. male here for wellness exam; up to date                        Also has unusual bleeding mild intermittent x 2 wks to the soft palate mouth behind the front teeth without obvious trauma, hot food or injury, or ulcer.  Pt denies chest pain, increased sob or doe, wheezing, orthopnea, PND, increased LE swelling, palpitations, dizziness or syncope.   Pt denies polydipsia, polyuria, or new focal neuro s/s.    Pt denies fever, wt loss, night sweats, loss of appetite, or other constitutional symptoms     Wt Readings from Last 3 Encounters:  02/26/24 (!) 313 lb (142 kg)  11/19/23 (!) 318 lb (144.2 kg)  08/29/23 (!) 315 lb (142.9 kg)   BP Readings from Last 3 Encounters:  02/26/24 126/84  11/19/23 130/70  08/29/23 126/78   Immunization History  Administered Date(s) Administered   Fluad Quad(high Dose 65+) 09/16/2020, 08/26/2021, 08/28/2022   Fluad Trivalent(High Dose 65+) 08/29/2023   Influenza Split 09/27/2012   Influenza Whole 09/07/2008, 12/04/2011   Influenza,inj,Quad PF,6+ Mos 10/03/2013, 10/09/2014, 10/19/2016, 01/25/2018   Influenza-Unspecified 08/30/2018   PFIZER(Purple Top)SARS-COV-2 Vaccination 01/09/2020, 01/30/2020, 01/25/2021   Pneumococcal Conjugate-13 08/01/2019   Pneumococcal Polysaccharide-23 03/05/2009, 10/19/2016, 02/24/2022   Td 03/05/2009   Td (Adult), 2 Lf Tetanus Toxid, Preservative Free 03/05/2009   Tdap 08/01/2019   Zoster Recombinant(Shingrix) 09/25/2022, 11/27/2022   Zoster, Live 04/16/2015   There are no preventive care reminders to display for this patient.     Past Medical History:  Diagnosis Date   Allergy    seasonal allergies    Anemia    on meds   Anxiety    hx of   Arthritis    generalized   Barrett's esophagus    Cancer (HCC) 10/17/2016   kidney cancer(removed left kidney in 2017)   Carpal tunnel syndrome, right 04/21/2011   Chronic bronchitis    Chronic kidney disease 2017   2017-had left kidney removed   ED (erectile dysfunction)    Esophageal reflux    on meds   Esophageal stricture    Essential hypertension 10/19/2015   on meds   H/O adenomatous polyp of colon 06/05/2012   Hiatal hernia    High cholesterol    on meds   History of Bell's palsy    IBS (irritable bowel syndrome)    Personal history of colonic polyps 04/15/2008 & 04/22/2012   TUBULAR ADENOMA   Ulcerative esophagitis    Past Surgical History:  Procedure Laterality Date   CARPAL TUNNEL RELEASE     left   INGUINAL HERNIA REPAIR     bilateral   KNEE ARTHROSCOPY     right Dr Thomasena Edis   ROBOT ASSISTED LAPAROSCOPIC NEPHRECTOMY Left 10/18/2016   Procedure: XI ROBOTIC ASSISTED LAPAROSCOPIC RADICAL NEPHRECTOMY;  Surgeon: Sebastian Ache, MD;  Location: [facility];  Service: Urology;  Laterality: Left;   WISDOM TOOTH EXTRACTION      reports that he quit smoking about 7 years ago. His smoking use included  cigarettes. He has never used smokeless tobacco. He reports current alcohol use of about 3.0 standard drinks of alcohol per week. He reports that he does not currently use drugs after having used the following drugs: Marijuana. Frequency: 2.00 times per week. family history includes Heart disease in his father; Hypertension in his brother; Lung cancer in his father. Allergies  Allergen Reactions   Bee Venom Anaphylaxis   Amoxicillin Rash    Did PCN reaction causing immediate rash, facial/tongue/throat swelling, SOB or lightheadedness with hypotension: no Did PCN reaction causing severe rash involving mucus membranes or skin necrosis: no Did PCN reaction that required hospitalization : no Did pcn reaction occurring within the last 10 years:  no If all of the above answers are "NO", then may proceed with Cephalosporin use. Pt states he can take low doses of amoxicillin, has taken for dental procedures without reaction   Apixaban Itching   Aspirin Nausea Only   Other Rash    Aspirin cream    Current Outpatient Medications on File Prior to Visit  Medication Sig Dispense Refill   Cholecalciferol (VITAMIN D3 PO) Take 5,000 Units by mouth daily.     Cyanocobalamin (VITAMIN B-12 PO) Take by mouth daily.     docusate sodium (COLACE) 100 MG capsule Take 100 mg by mouth daily.     ferrous sulfate 325 (65 FE) MG tablet Take 325 mg by mouth daily with breakfast.     Psyllium 30.9 % POWD Take 1 packet by mouth daily.     torsemide (DEMADEX) 20 MG tablet Take 1 tablet (20 mg total) by mouth daily. 90 tablet 3   XARELTO 20 MG TABS tablet TAKE 1 TABLET(20 MG) BY MOUTH DAILY WITH SUPPER 90 tablet 1   No current facility-administered medications on file prior to visit.        ROS:  All others reviewed and negative.  Objective        PE:  BP 126/84 (BP Location: Right Arm, Patient Position: Sitting, Cuff Size: Normal)   Pulse (!) 49   Temp 98.3 F (36.8 C) (Oral)   Ht 6\' 1"  (1.854 m)   Wt (!) 313 lb (142 kg)   SpO2 97%   BMI 41.30 kg/m                 Constitutional: Pt appears in NAD               HENT: Head: NCAT.                Right Ear: External ear normal.                 Left Ear: External ear normal.                Eyes: . Pupils are equal, round, and reactive to light. Conjunctivae and EOM are normal               Nose: without d/c or deformity               Neck: Neck supple. Gross normal ROM               Cardiovascular: Normal rate and regular rhythm.                 Pulmonary/Chest: Effort normal and breath sounds without rales or wheezing.                Abd:  Soft, NT, ND, + BS, no  organomegaly               Neurological: Pt is alert. At baseline orientation, motor grossly intact               Skin: Skin is  warm. No rashes, no other new lesions, LE edema - none               Psychiatric: Pt behavior is normal without agitation   Micro: none  Cardiac tracings I have personally interpreted today:  none  Pertinent Radiological findings (summarize): none   Lab Results  Component Value Date   WBC 4.4 02/20/2024   HGB 12.1 (L) 02/20/2024   HCT 36.2 (L) 02/20/2024   PLT 181.0 02/20/2024   GLUCOSE 111 (H) 02/20/2024   CHOL 112 02/20/2024   TRIG 92.0 02/20/2024   HDL 52.40 02/20/2024   LDLDIRECT 151.7 03/25/2010   LDLCALC 41 02/20/2024   ALT 16 02/20/2024   AST 13 02/20/2024   NA 135 02/20/2024   K 3.7 02/20/2024   CL 99 02/20/2024   CREATININE 1.52 (H) 02/20/2024   BUN 24 (H) 02/20/2024   CO2 30 02/20/2024   TSH 0.90 02/20/2024   PSA 1.45 02/20/2024   HGBA1C 6.1 02/20/2024   MICROALBUR 2.6 (H) 02/20/2024   Assessment/Plan:  Gorje Iyer is a 68 y.o. White or Caucasian [1] male with  has a past medical history of Allergy, Anemia, Anxiety, Arthritis, Barrett's esophagus, Cancer (HCC) (10/17/2016), Carpal tunnel syndrome, right (04/21/2011), Chronic bronchitis, Chronic kidney disease (2017), ED (erectile dysfunction), Esophageal reflux, Esophageal stricture, Essential hypertension (10/19/2015), H/O adenomatous polyp of colon (06/05/2012), Hiatal hernia, High cholesterol, History of Bell's palsy, IBS (irritable bowel syndrome), Personal history of colonic polyps (04/15/2008 & 04/22/2012), and Ulcerative esophagitis.  Encounter for well adult exam with abnormal findings Age and sex appropriate education and counseling updated with regular exercise and diet Referrals for preventative services - none needed Immunizations addressed - none needed Smoking counseling  - none needed Evidence for depression or other mood disorder - none significant Most recent labs reviewed. I have personally reviewed and have noted: 1) the patient's medical and social history 2) The patient's current  medications and supplements 3) The patient's height, weight, and BMI have been recorded in the chart   CKD (chronic kidney disease) Lab Results  Component Value Date   CREATININE 1.52 (H) 02/20/2024   Stable overall, cont to avoid nephrotoxins   Essential hypertension BP Readings from Last 3 Encounters:  02/26/24 126/84  11/19/23 130/70  08/29/23 126/78   With new onset microalbuminuria, pt change amlodipine 2.5 mg to losartan 50 mg qd   HYPERCHOLESTEROLEMIA Lab Results  Component Value Date   LDLCALC 41 02/20/2024   Stable, pt to continue current statin lipitor 40 mg    Impaired glucose tolerance Lab Results  Component Value Date   HGBA1C 6.1 02/20/2024   Stable, pt to continue current medical treatment farxiga 10 qd   Vitamin D deficiency Last vitamin D Lab Results  Component Value Date   VD25OH 53.77 02/20/2024   Stable, cont oral replacement   Coronary artery calcification Delcines card CT score for now,  Mouth bleeding Mild but persistently recurring on chronic anticoagulation, exam benign today but will need refer oral surgury  Followup: Return in about 6 months (around 08/28/2024).  Oliver Barre, MD 02/26/2024 12:47 PM [facility]

## 2024-02-26 NOTE — Assessment & Plan Note (Signed)
 Lab Results  Component Value Date   CREATININE 1.52 (H) 02/20/2024   Stable overall, cont to avoid nephrotoxins

## 2024-02-26 NOTE — Patient Instructions (Addendum)
 Please check on the Jardiance 25 mg per day for 3 mo, and if less expensive let me know.    Ok to stop the amlodipine 2.5 mg  Please take all new medication as prescribed - the losartan 50 mg per day  Please continue all other medications as before, and refills have been done if requested.  Please have the pharmacy call with any other refills you may need.  Please continue your efforts at being more active, low cholesterol diet, and weight control.  You are otherwise up to date with prevention measures today.  Please keep your appointments with your specialists as you may have planned  You will be contacted regarding the referral for: oral surgury  Please make an Appointment to return in 6 months, or sooner if needed, also with Lab Appointment for testing done 3-5 days before at the FIRST FLOOR Lab (so this is for TWO appointments - please see the scheduling desk as you leave)

## 2024-02-26 NOTE — Assessment & Plan Note (Signed)
 Delcines card CT score for now,

## 2024-02-26 NOTE — Assessment & Plan Note (Signed)
 Last vitamin D Lab Results  Component Value Date   VD25OH 53.77 02/20/2024   Stable, cont oral replacement

## 2024-02-26 NOTE — Assessment & Plan Note (Signed)
 BP Readings from Last 3 Encounters:  02/26/24 126/84  11/19/23 130/70  08/29/23 126/78   With new onset microalbuminuria, pt change amlodipine 2.5 mg to losartan 50 mg qd

## 2024-02-26 NOTE — Assessment & Plan Note (Signed)
 Mild but persistently recurring on chronic anticoagulation, exam benign today but will need refer oral surgury

## 2024-02-26 NOTE — Assessment & Plan Note (Signed)

## 2024-03-20 NOTE — Progress Notes (Signed)
 Cardiology Office Note:    Date:  03/21/2024  ID:  Garritt, Molyneux 04-18-1956, MRN [id number] PCP: Corwin Levins, MD  [facility] Providers Cardiologist:  Christell Constant, MD       Patient Profile:      Coronary artery calcification CAC score 09/20/2022: 0.69 (18th percentile) Permanent atrial fibrillation/flutter TTE 04/02/23: EF 60-65, no RWMA, mild LVH, normal RVSF, normal PASP Allergy to apixaban >> Xarelto (HFpEF) heart failure with preserved ejection fraction  Chronic kidney disease  Hypertension Hyperlipidemia Aortic atherosclerosis Renal CA s/p left nephrectomy         Discussed the use of AI scribe software for clinical note transcription with the patient, who gave verbal consent to proceed.  History of Present Illness Henry Ellis is a 68 y.o. male who returns for follow-up of atrial fibrillation.  He was last seen by Dr. Raynelle Jan in 11/2023.  He had lower extremity edema and was placed on torsemide every other day.  He is here alone.  Since starting on torsemide, the swelling in his legs remains unchanged.  He has lost 15 pounds since starting the medication, suggesting some fluid loss. He takes torsemide 20 mg daily in the morning.  He has not had shortness of breath, chest discomfort, or dizziness. He can climb one flight of stairs without stopping and does not experience orthopnea, although he sleeps with his bed elevated due to acid reflux. The swelling in his legs is sometimes improved in the morning after being propped up overnight. His weight fluctuates, but he monitors it regularly at home. He follows up with a nephrologist annually for chronic kidney disease. No issues with Xarelto, such as bleeding or melena. He also takes prostate medication, which alleviates urinary symptoms.   ROS-See HPI    Studies Reviewed:       Results LABS Total cholesterol: 112 mg/dL (86/57) HDL: 84.6 mg/dL (96/29) LDL: 41 mg/dL  (52/84) Triglycerides: 92 mg/dL (13/24) Hemoglobin: 40.1 g/dL (02/72) Creatinine: 5.36 mg/dL (64/40) Potassium: 3.7 mmol/L (03/25) ALT: 16 U/L (03/25) BNP: 4028 pg/mL (11/2023) Creatinine clearance: 89 mL/min (02/20/2024) GFR: 46.4 mL/min/1.23m (02/20/2024)    Risk Assessment/Calculations:    CHA2DS2-VASc Score = 4   This indicates a 4.8% annual risk of stroke. The patient's score is based upon: CHF History: 1 HTN History: 1 Diabetes History: 0 Stroke History: 0 Vascular Disease History: 1 Age Score: 1 Gender Score: 0            Physical Exam:   VS:  BP 120/80   Pulse 62   Ht 6\' 1"  (1.854 m)   Wt (!) 303 lb 9.6 oz (137.7 kg)   SpO2 97%   BMI 40.06 kg/m    Wt Readings from Last 3 Encounters:  03/21/24 (!) 303 lb 9.6 oz (137.7 kg)  02/26/24 (!) 313 lb (142 kg)  11/19/23 (!) 318 lb (144.2 kg)    Constitutional:      Appearance: Healthy appearance. Not in distress.  Neck:     Vascular: No JVR. JVD normal.  Pulmonary:     Breath sounds: Normal breath sounds. No wheezing. No rales.  Cardiovascular:     Normal rate. Irregularly irregular rhythm.     Murmurs: There is no murmur.  Edema:    Peripheral edema present.    Pretibial: bilateral trace edema of the pretibial area.    Ankle: bilateral 1+ edema of the ankle. Abdominal:     Palpations: Abdomen is soft.  Assessment and Plan:   Assessment & Plan Heart failure with preserved ejection fraction (HFpEF) EF 60-65% by echocardiogram April 2024.  He was started on torsemide in December due to elevated BNP (4028).  He has since lost 15 pounds.  Volume status is stable.  He is NYHA class II.  Some of his BNP elevation may have been related to chronic atrial fibrillation.  - Continue torsemide 20 mg daily - Continue Farxiga 10 mg daily - Consider spironolactone if needed - Monitor weight daily and report any sudden weight gain of more than 3 pounds in one day - Follow up in 6 months  Coronary artery  calcification Coronary artery calcification on CT in 09/2022 with a calcium score of 0.69, placing him in the 18th percentile. No symptoms suggestive of angina. - Continue Lipitor 40 mg daily  Permanent atrial fibrillation/flutter Permanent atrial fibrillation/flutter with controlled heart rate. Tolerating anticoagulation with Xarelto, and recent hemoglobin levels are normal. Creatinine clearance is 89 cc/min.  - Continue Xarelto 20 mg daily  Hypertension Blood pressure is well-controlled with current medication regimen. - Continue losartan 50 mg daily  Chronic kidney disease Renal function has remained stable.  He is followed by Dr. Marisue Humble with nephrology.         Dispo:  Return in about 6 months (around 09/20/2024) for Routine Follow Up, w/ Dr. Izora Ribas.  Signed, Tereso Newcomer, PA-C

## 2024-03-21 ENCOUNTER — Encounter: Payer: Self-pay | Admitting: Physician Assistant

## 2024-03-21 ENCOUNTER — Ambulatory Visit: Attending: Physician Assistant | Admitting: Physician Assistant

## 2024-03-21 VITALS — BP 120/80 | HR 62 | Ht 73.0 in | Wt 303.6 lb

## 2024-03-21 DIAGNOSIS — N1831 Chronic kidney disease, stage 3a: Secondary | ICD-10-CM

## 2024-03-21 DIAGNOSIS — I2510 Atherosclerotic heart disease of native coronary artery without angina pectoris: Secondary | ICD-10-CM

## 2024-03-21 DIAGNOSIS — I10 Essential (primary) hypertension: Secondary | ICD-10-CM | POA: Diagnosis not present

## 2024-03-21 DIAGNOSIS — I483 Typical atrial flutter: Secondary | ICD-10-CM

## 2024-03-21 DIAGNOSIS — I5032 Chronic diastolic (congestive) heart failure: Secondary | ICD-10-CM

## 2024-03-21 DIAGNOSIS — R600 Localized edema: Secondary | ICD-10-CM

## 2024-03-21 NOTE — Patient Instructions (Signed)
 Medication Instructions:  Your physician recommends that you continue on your current medications as directed. Please refer to the Current Medication list given to you today.  *If you need a refill on your cardiac medications before your next appointment, please call your pharmacy*  Lab Work: None ordered  If you have labs (blood work) drawn today and your tests are completely normal, you will receive your results only by: MyChart Message (if you have MyChart) OR A paper copy in the mail If you have any lab test that is abnormal or we need to change your treatment, we will call you to review the results.  Testing/Procedures: None ordered  Follow-Up: At [facility], you and your health needs are our priority.  As part of our continuing mission to provide you with exceptional heart care, our providers are all part of one team.  This team includes your primary Cardiologist (physician) and Advanced Practice Providers or APPs (Physician Assistants and Nurse Practitioners) who all work together to provide you with the care you need, when you need it.  Your next appointment:   6 month(s)  Provider:   Christell Constant, MD     We recommend signing up for the patient portal called "MyChart".  Sign up information is provided on this After Visit Summary.  MyChart is used to connect with patients for Virtual Visits (Telemedicine).  Patients are able to view lab/test results, encounter notes, upcoming appointments, etc.  Non-urgent messages can be sent to your provider as well.   To learn more about what you can do with MyChart, go to [email or web address].   Other Instructions       1st Floor: - Lobby - Registration  - Pharmacy  - Lab - Cafe  2nd Floor: - PV Lab - Diagnostic Testing (echo, CT, nuclear med)  3rd Floor: - Vacant  4th Floor: - TCTS (cardiothoracic surgery) - AFib Clinic - Structural Heart Clinic - Vascular Surgery  - Vascular Ultrasound  5th  Floor: - HeartCare Cardiology (general and EP) - Clinical Pharmacy for coumadin, hypertension, lipid, weight-loss medications, and med management appointments    Valet parking services will be available as well.

## 2024-03-24 ENCOUNTER — Other Ambulatory Visit: Payer: Self-pay | Admitting: Internal Medicine

## 2024-03-24 DIAGNOSIS — I4892 Unspecified atrial flutter: Secondary | ICD-10-CM

## 2024-03-24 NOTE — Telephone Encounter (Signed)
 Prescription refill request for Xarelto received.  Indication: Aflutter Last office visit: 03/21/24 Alben Spittle)  Weight: 137.7kg Age: 68 Scr: 1.52 (02/20/24)  CrCl: 89.55ml/min  Appropriate dose. Refill sent.

## 2024-04-03 DIAGNOSIS — H5213 Myopia, bilateral: Secondary | ICD-10-CM | POA: Diagnosis not present

## 2024-04-08 ENCOUNTER — Other Ambulatory Visit: Payer: Self-pay

## 2024-04-08 ENCOUNTER — Ambulatory Visit: Payer: Self-pay

## 2024-04-08 ENCOUNTER — Encounter (HOSPITAL_COMMUNITY): Payer: Self-pay | Admitting: *Deleted

## 2024-04-08 ENCOUNTER — Emergency Department (HOSPITAL_COMMUNITY)
Admission: EM | Admit: 2024-04-08 | Discharge: 2024-04-08 | Disposition: A | Discharge status: Home / Self Care | Attending: Emergency Medicine | Admitting: Emergency Medicine

## 2024-04-08 ENCOUNTER — Emergency Department (HOSPITAL_COMMUNITY)

## 2024-04-08 DIAGNOSIS — Z7901 Long term (current) use of anticoagulants: Secondary | ICD-10-CM | POA: Diagnosis not present

## 2024-04-08 DIAGNOSIS — H81399 Other peripheral vertigo, unspecified ear: Secondary | ICD-10-CM | POA: Diagnosis not present

## 2024-04-08 DIAGNOSIS — Z79899 Other long term (current) drug therapy: Secondary | ICD-10-CM | POA: Insufficient documentation

## 2024-04-08 DIAGNOSIS — I110 Hypertensive heart disease with heart failure: Secondary | ICD-10-CM | POA: Insufficient documentation

## 2024-04-08 DIAGNOSIS — I509 Heart failure, unspecified: Secondary | ICD-10-CM | POA: Insufficient documentation

## 2024-04-08 DIAGNOSIS — R42 Dizziness and giddiness: Secondary | ICD-10-CM | POA: Diagnosis not present

## 2024-04-08 LAB — URINALYSIS, ROUTINE W REFLEX MICROSCOPIC
Bacteria, UA: NONE SEEN
Bilirubin Urine: NEGATIVE
Glucose, UA: >=500 mg/dL — AB
Hgb urine dipstick: NEGATIVE
Ketones, ur: NEGATIVE mg/dL
Leukocytes,Ua: NEGATIVE
Nitrite: NEGATIVE
Protein, ur: NEGATIVE mg/dL
Specific Gravity, Urine: 1.011 (ref 1.005–1.030)
pH: 6 (ref 5.0–8.0)

## 2024-04-08 LAB — BASIC METABOLIC PANEL WITH GFR
Anion gap: 10 (ref 5–15)
BUN: 25 mg/dL — ABNORMAL HIGH (ref 8–23)
CO2: 27 mmol/L (ref 22–32)
Calcium: 9 mg/dL (ref 8.9–10.3)
Chloride: 101 mmol/L (ref 98–111)
Creatinine, Ser: 1.61 mg/dL — ABNORMAL HIGH (ref 0.61–1.24)
GFR, Estimated: 46 mL/min — ABNORMAL LOW (ref >60)
Glucose, Bld: 102 mg/dL — ABNORMAL HIGH (ref 70–99)
Potassium: 4.3 mmol/L (ref 3.5–5.1)
Sodium: 138 mmol/L (ref 135–145)

## 2024-04-08 LAB — CBC
HCT: 37.6 % — ABNORMAL LOW (ref 39.0–52.0)
Hemoglobin: 12.2 g/dL — ABNORMAL LOW (ref 13.0–17.0)
MCH: 33.9 pg (ref 26.0–34.0)
MCHC: 32.4 g/dL (ref 30.0–36.0)
MCV: 104.4 fL — ABNORMAL HIGH (ref 80.0–100.0)
Platelets: 189 10*3/uL (ref 150–400)
RBC: 3.6 MIL/uL — ABNORMAL LOW (ref 4.22–5.81)
RDW: 12.5 % (ref 11.5–15.5)
WBC: 6 10*3/uL (ref 4.0–10.5)
nRBC: 0 % (ref 0.0–0.2)

## 2024-04-08 LAB — TSH: TSH: 1.012 u[IU]/mL (ref 0.350–4.500)

## 2024-04-08 LAB — MAGNESIUM: Magnesium: 2.2 mg/dL (ref 1.7–2.4)

## 2024-04-08 MED ORDER — MECLIZINE HCL 25 MG PO TABS: 25.0000 mg | ORAL_TABLET | Freq: Three times a day (TID) | ORAL | 0 refills | Status: DC | PRN

## 2024-04-08 MED ORDER — IOHEXOL 350 MG/ML SOLN
75.0000 mL | Freq: Once | INTRAVENOUS | Status: AC | PRN
  Administered 2024-04-08: 75 mL via INTRAVENOUS

## 2024-04-08 MED ORDER — MECLIZINE HCL 25 MG PO TABS
25.0000 mg | ORAL_TABLET | Freq: Once | ORAL | Status: AC
  Administered 2024-04-08: 25 mg via ORAL
  Filled 2024-04-08: qty 1

## 2024-04-08 MED ORDER — ONDANSETRON HCL 4 MG PO TABS: 4.0000 mg | ORAL_TABLET | Freq: Four times a day (QID) | ORAL | 0 refills | Status: DC

## 2024-04-08 NOTE — Telephone Encounter (Signed)
 Chief Complaint: dizziness Symptoms: dizziness, unsteady gait Frequency: since last Friday Pertinent Negatives: Patient denies CP, SOB, vomiting, bleeding, palpitations, diaphoresis, falls Disposition: [x] ED /[] Urgent Care (no appt availability in office) / [] Appointment(In office/virtual)/ []  [facility]/ [] Home Care/ [] Refused Recommended Disposition /[] [facility]/ []  Follow-up with PCP Additional Notes: Pt reports dizziness since last Friday. Pt states he was sitting watching TV when all of a sudden he felt dizzy as if he was intoxicated. Pt denies any alcohol since Christmas. Pt states he is unsteady on his feet and has to use a cane and hold onto the wall to steady himself. Pt states he is taking Dramamine which has helped. Pt endorses the dizziness is intermittent. Pt endorses that standing up and turning his head side to side worsens the dizziness. Pt denies dizziness right now at this time. Cardiac hx with Afib. Denies palpitations, CP, SOB, diaphoresis, vomiting, bleeding (other than if he strains d/t hemorrhoids), weakness. Pt endorses nausea with dizziness. Denies H/A and blurry vision. RN advised pt he needs to go to the ED for severe dizziness with a cardiac hx. Initially pt stated he did not want to go, citing concerns with parking. RN educated pt on why the ED is the best place for the pt, the pt became agreeable and states he is OK to drive because the dizziness is intermittent and not occurring at this time. RN advised pt he should not drive under these conditions. Pt states he will ask his sister to take him. RN advised pt if he worsens he needs to call 911.     Copied from CRM [id number]. Topic: Clinical - Red Word Triage >> Apr 08, 2024 10:21 AM [facility] [place] wrote: Red Word that prompted transfer to Nurse Triage: light headed and dizzy Reason for Disposition  SEVERE dizziness (e.g., unable to stand, requires support to walk, feels like passing out now)  Answer  Assessment - Initial Assessment Questions 1. DESCRIPTION: "Describe your dizziness."     Light-headed and dizzy since last Friday - states he was watching TV and it suddenly hit him. States he felt intoxicated but has not had alcohol since Xmas 2. LIGHTHEADED: "Do you feel lightheaded?" (e.g., somewhat faint, woozy, weak upon standing)     Yes - when I get up I am unsteady, using a cane. Pt endorses he has not had alcohol since Xmas but feels intoxicated and unsteady on his feet 3. VERTIGO: "Do you feel like either you or the room is spinning or tilting?" (i.e. vertigo)     No 4. SEVERITY: "How bad is it?"  "Do you feel like you are going to faint?" "Can you stand and walk?"   - MILD: Feels slightly dizzy, but walking normally.   - MODERATE: Feels unsteady when walking, but not falling; interferes with normal activities (e.g., school, work).   - SEVERE: Unable to walk without falling, or requires assistance to walk without falling; feels like passing out now.      Using a cane, unsteady gait - sounds severe, holding wall to keep from falling  5. ONSET:  "When did the dizziness begin?"     Last Friday 6. AGGRAVATING FACTORS: "Does anything make it worse?" (e.g., standing, change in head position)     Standing up makes it worse sometimes, turning his head affects him as well  7. HEART RATE: "Can you tell me your heart rate?" "How many beats in 15 seconds?"  (Note: not all patients can do this)       "  I see my cardiologist every 6 months" 8. CAUSE: "What do you think is causing the dizziness?"     Not sure 9. RECURRENT SYMPTOM: "Have you had dizziness before?" If Yes, ask: "When was the last time?" "What happened that time?"     No  10. OTHER SYMPTOMS: "Do you have any other symptoms?" (e.g., fever, chest pain, vomiting, diarrhea, bleeding) No CP or SOB. No diarrhea or vomiting. Endorses rectal bleeding "only I strain too much" d/t hemorrhoids. "Nausea when my head starts spinning." Denies  diaphoresis. Has hx of Afib but denies palpitations. States he is taking motion sickness medicine which is helping (Dramamine). Dnies H/A or blurry vision.  Protocols used: Dizziness - Lightheadedness-A-AH

## 2024-04-08 NOTE — Discharge Instructions (Signed)
 You were seen in the emergency department for your dizziness.  This appeared most consistent with vertigo which is the room spinning type of dizziness.  Your workup showed no signs of stroke or dehydration.  The most common causes from an inner ear issue around the balance crystals in your ear becomes loose and you have the dizziness upon head or body movements.  You can take meclizine  as needed for your dizziness and Zofran  as needed for nausea.  You should follow-up with your primary doctor in the next few days to have your symptoms rechecked.  You can follow-up with ENT if you are having recurrent symptoms.  You should return to the emergency department if you are too dizzy to walk, you have numbness or weakness on one side of the body compared to the other, you are unable to talk or if you have any other new or concerning symptoms.

## 2024-04-08 NOTE — ED Provider Notes (Signed)
  EMERGENCY DEPARTMENT AT [facility] Provider Note   CSN: [id number] Arrival date & time: 04/08/24  1150     History  Chief Complaint  Patient presents with   Dizziness    Henry Ellis is a 68 y.o. male.  Patient is a 68 year old male with a past medical history of A-fib on Xarelto , CHF, hypertension presenting to the emergency department with dizziness.  Patient states since Friday he has had on and off dizziness.  He states that he feels like he is drunk and is off balance and like he might fall when he tries to stand up.  He states that this will happen even when he is sitting on the couch.  He denies any room spinning dizziness.  States that he does have some nausea.  Denies any vision changes, headaches, numbness or weakness.  He states that he took over-the-counter Dramamine at home with some improvement of symptoms.  The history is provided by the patient.  Dizziness      Home Medications Prior to Admission medications   Medication Sig Start Date End Date Taking? Authorizing Provider  meclizine  (ANTIVERT ) 25 MG tablet Take 1 tablet (25 mg total) by mouth 3 (three) times daily as needed for dizziness. 04/08/24  Yes Nora Beal, Kahlyn Shippey K, DO  ondansetron  (ZOFRAN ) 4 MG tablet Take 1 tablet (4 mg total) by mouth every 6 (six) hours. 04/08/24  Yes Nora Beal, Skilynn Durney K, DO  atorvastatin  (LIPITOR) 40 MG tablet Take 1 tablet (40 mg total) by mouth daily. 02/26/24 02/20/25  Roslyn Coombe, MD  Cholecalciferol (VITAMIN D3 PO) Take 5,000 Units by mouth daily.    [provider]  Cyanocobalamin  (VITAMIN B-12 PO) Take by mouth daily.    [provider]  dapagliflozin  propanediol (FARXIGA ) 10 MG TABS tablet Take 1 tablet (10 mg total) by mouth daily before breakfast. 02/26/24   Roslyn Coombe, MD  docusate sodium  (COLACE) 100 MG capsule Take 100 mg by mouth daily.    [provider]  esomeprazole  (NEXIUM ) 20 MG capsule Take 1 capsule (20 mg  total) by mouth daily at 12 noon. 02/26/24   Roslyn Coombe, MD  ferrous sulfate 325 (65 FE) MG tablet Take 325 mg by mouth daily with breakfast.    [provider]  finasteride  (PROSCAR ) 5 MG tablet Take 1 tablet (5 mg total) by mouth daily. 02/26/24   Roslyn Coombe, MD  losartan  (COZAAR ) 50 MG tablet Take 1 tablet (50 mg total) by mouth daily. 02/26/24   Roslyn Coombe, MD  Psyllium 30.9 % POWD Take 1 packet by mouth daily.    [provider]  solifenacin  (VESICARE ) 5 MG tablet Take 1 tablet (5 mg total) by mouth daily. 02/26/24   Roslyn Coombe, MD  torsemide  (DEMADEX ) 20 MG tablet Take 1 tablet (20 mg total) by mouth daily. 12/07/23   Chandrasekhar, Mahesh A, MD  XARELTO  20 MG TABS tablet TAKE 1 TABLET(20 MG) BY MOUTH DAILY WITH SUPPER 03/24/24   Chandrasekhar, Mahesh A, MD      Allergies    Bee venom, Amoxicillin, Apixaban , Aspirin, and Other    Review of Systems   Review of Systems  Neurological:  Positive for dizziness.    Physical Exam Updated Vital Signs BP 123/77   Pulse (!) 51   Temp 97.9 F (36.6 C) (Oral)   Resp 18   Ht 6\' 1"  (1.854 m)   Wt 133.8 kg   SpO2 100%  BMI 38.92 kg/m  Physical Exam Vitals and nursing note reviewed.  Constitutional:      General: He is not in acute distress.    Appearance: Normal appearance.  HENT:     Head: Normocephalic and atraumatic.     Right Ear: Tympanic membrane, ear canal and external ear normal.     Left Ear: Tympanic membrane, ear canal and external ear normal.     Nose: Nose normal.     Mouth/Throat:     Mouth: Mucous membranes are moist.     Pharynx: Oropharynx is clear.  Eyes:     Extraocular Movements: Extraocular movements intact.     Conjunctiva/sclera: Conjunctivae normal.     Pupils: Pupils are equal, round, and reactive to light.     Comments: No nystagmus  Cardiovascular:     Rate and Rhythm: Normal rate and regular rhythm.     Heart sounds: Normal heart sounds.  Pulmonary:     Effort: Pulmonary  effort is normal.     Breath sounds: Normal breath sounds.  Abdominal:     General: Abdomen is flat.     Palpations: Abdomen is soft.     Tenderness: There is no abdominal tenderness.  Musculoskeletal:        General: Normal range of motion.     Cervical back: Normal range of motion.  Skin:    General: Skin is warm and dry.  Neurological:     Mental Status: He is alert and oriented to person, place, and time. Mental status is at baseline.     Cranial Nerves: No cranial nerve deficit.     Sensory: No sensory deficit.     Motor: No weakness.     Coordination: Coordination normal.     Comments: Baseline left-sided facial droop from prior Bell's palsy  Psychiatric:        Behavior: Behavior normal.     ED Results / Procedures / Treatments   Labs (all labs ordered are listed, but only abnormal results are displayed) Labs Reviewed  BASIC METABOLIC PANEL WITH GFR - Abnormal; Notable for the following components:      Result Value   Glucose, Bld 102 (*)    BUN 25 (*)    Creatinine, Ser 1.61 (*)    GFR, Estimated 46 (*)    All other components within normal limits  CBC - Abnormal; Notable for the following components:   RBC 3.60 (*)    Hemoglobin 12.2 (*)    HCT 37.6 (*)    MCV 104.4 (*)    All other components within normal limits  URINALYSIS, ROUTINE W REFLEX MICROSCOPIC - Abnormal; Notable for the following components:   Glucose, UA >=500 (*)    All other components within normal limits  TSH  MAGNESIUM   CBG MONITORING, ED    EKG EKG Interpretation Date/Time:  Tuesday April 08 2024 12:19:53 EDT Ventricular Rate:  58 PR Interval:    QRS Duration:  100 QT Interval:  390 QTC Calculation: 382 R Axis:   53  Text Interpretation: Atrial fibrillation with slow ventricular response Low voltage QRS Incomplete right bundle branch block Cannot rule out Anterior infarct , age undetermined Abnormal ECG No significant change was found Confirmed by Celesta Coke ([id number]) on  04/08/2024 6:42:36 PM  Radiology CT Angio Head Neck W WO CM Result Date: 04/08/2024 CLINICAL DATA:  Central vertigo EXAM: CT ANGIOGRAPHY HEAD AND NECK WITH AND WITHOUT CONTRAST TECHNIQUE: Multidetector CT imaging of the head and neck was performed  using the standard protocol during bolus administration of intravenous contrast. Multiplanar CT image reconstructions and MIPs were obtained to evaluate the vascular anatomy. Carotid stenosis measurements (when applicable) are obtained utilizing NASCET criteria, using the distal internal carotid diameter as the denominator. RADIATION DOSE REDUCTION: This exam was performed according to the departmental dose-optimization program which includes automated exposure control, adjustment of the mA and/or kV according to patient size and/or use of iterative reconstruction technique. CONTRAST:  75mL OMNIPAQUE  IOHEXOL  350 MG/ML SOLN COMPARISON:  None Available. FINDINGS: CT HEAD FINDINGS Brain: No mass,hemorrhage or extra-axial collection. Normal appearance of the parenchyma and CSF spaces. Vascular: Atherosclerotic calcification of the internal carotid arteries at the skull base. No abnormal hyperdensity of the major intracranial arteries or dural venous sinuses. Skull: Left maxillary retention cyst. Sinuses/Orbits: No fluid levels or advanced mucosal thickening of the visualized paranasal sinuses. No mastoid or middle ear effusion. Normal orbits. CTA NECK FINDINGS Skeleton: No acute abnormality or high grade bony spinal canal stenosis. Other neck: Normal pharynx, larynx and major salivary glands. No cervical lymphadenopathy. Unremarkable thyroid  gland. Upper chest: No pneumothorax or pleural effusion. No nodules or masses. Aortic arch: There is no calcific atherosclerosis of the aortic arch. Conventional 3 vessel aortic branching pattern. RIGHT carotid system: No dissection, occlusion or aneurysm. Mild atherosclerotic calcification at the carotid bifurcation without  hemodynamically significant stenosis. LEFT carotid system: No dissection, occlusion or aneurysm. Mild atherosclerotic calcification at the carotid bifurcation without hemodynamically significant stenosis. Vertebral arteries: Codominant configuration. There is no dissection, occlusion or flow-limiting stenosis to the skull base (V1-V3 segments). CTA HEAD FINDINGS POSTERIOR CIRCULATION: Vertebral arteries are normal. No proximal occlusion of the anterior or inferior cerebellar arteries. Basilar artery is normal. Superior cerebellar arteries are normal. Posterior cerebral arteries are normal. ANTERIOR CIRCULATION: Intracranial internal carotid arteries are normal. Anterior cerebral arteries are normal. Middle cerebral arteries are normal. Venous sinuses: As permitted by contrast timing, patent. Anatomic variants: None Review of the MIP images confirms the above findings. IMPRESSION: 1. No emergent large vessel occlusion or hemodynamically significant stenosis of the head or neck. 2. Mild bilateral carotid bifurcation atherosclerosis without hemodynamically significant stenosis. Electronically Signed   By: Juanetta Nordmann M.D.   On: 04/08/2024 21:29    Procedures Procedures    Medications Ordered in ED Medications  meclizine  (ANTIVERT ) tablet 25 mg (25 mg Oral Given 04/08/24 1236)  iohexol  (OMNIPAQUE ) 350 MG/ML injection 75 mL (75 mLs Intravenous Contrast Given 04/08/24 2115)    ED Course/ Medical Decision Making/ A&P Clinical Course as of 04/08/24 2155  Tue Apr 08, 2024  2136 No acute infarcts or significant stenosis on CTA. Orthostatics negative. Suspect likely peripheral vertigo. Patient is stable for discharge home on meclizine  with primary care and ENT as needed follow up. [VK]    Clinical Course User Index [VK] Kingsley, Tashiba Timoney K, DO                                 Medical Decision Making This patient presents to the ED with chief complaint(s) of dizziness with pertinent past medical history  of A-fib, hypertension, CHF which further complicates the presenting complaint. The complaint involves an extensive differential diagnosis and also carries with it a high risk of complications and morbidity.    The differential diagnosis includes arrhythmia, anemia, dehydration, electrolyte abnormality, vertigo, CVA, TIA  Additional history obtained: Additional history obtained from N/A Records reviewed Primary Care Documents and outpatient cardiology  records  ED Course and Reassessment: On patient's arrival he is hemodynamically stable in no acute distress.  EKG showed A-fib with slow ventricular response which is at baseline for the patient.  Patient had labs initiated in triage that are at his baseline without acute abnormality.  Patient is currently asymptomatic without any focal neurologic deficits.  Does have CVA risk factors and will have CTA of head and neck to evaluate for possible subacute stroke or severe occlusion that could be contributing to his symptoms and he will be closely reassessed.  Independent labs interpretation:  The following labs were independently interpreted: no acute abnormality  Independent visualization of imaging: - I independently visualized the following imaging with scope of interpretation limited to determining acute life threatening conditions related to emergency care: CTA Head/neck, which revealed no acute abnormality  Consultation: - Consulted or discussed management/test interpretation w/ external professional: N/A  Consideration for admission or further workup: Patient has no emergent conditions requiring admission or further work-up at this time and is stable for discharge home with primary care follow-up  Social Determinants of health: N/A    Amount and/or Complexity of Data Reviewed Labs: ordered. Radiology: ordered.  Risk Prescription drug management.          Final Clinical Impression(s) / ED Diagnoses Final diagnoses:   Peripheral vertigo, unspecified laterality    Rx / DC Orders ED Discharge Orders          Ordered    meclizine  (ANTIVERT ) 25 MG tablet  3 times daily PRN        04/08/24 2153    ondansetron  (ZOFRAN ) 4 MG tablet  Every 6 hours        04/08/24 2153              Kingsley, Aveon Colquhoun K, DO 04/08/24 2155

## 2024-04-08 NOTE — ED Triage Notes (Addendum)
 Pt is here with feeling like his head is spinning.  Pt states that this has been intermittent since last Friday.  No CP or sob with this, no focal weakness.  Pt is alert and oriented. Relief with dramamine at home

## 2024-04-08 NOTE — ED Provider Triage Note (Signed)
 Emergency Medicine Provider Triage Evaluation Note  Hezakiah Champeau , a 68 y.o. male  was evaluated in triage.  Pt complains of intermittent dizziness that has been on and off for the past week.  Reports it normally occurs when he is sitting and lasts about 2 hours.  Reports feeling lightheaded and nauseous during these episodes.  Denies any double vision.  Denies any unilateral weakness, slurred speech, facial droop.  Reports improvement with Dramamine at home.  Review of Systems  Positive: As above Negative: As above  Physical Exam  BP (!) 155/86 (BP Location: Right Arm)   Pulse (!) 58   Temp 98.7 F (37.1 C)   Resp 18   SpO2 95%  Gen:   Awake, no distress  Resp:  Normal effort  MSK:   Moves extremities without difficulty  Other:  No focal neurologic deficits  Medical Decision Making  Medically screening exam initiated at 12:29 PM.  Appropriate orders placed.  Alessander Sikorski was informed that the remainder of the evaluation will be completed by another provider, this initial triage assessment does not replace that evaluation, and the importance of remaining in the ED until their evaluation is complete.     Rexie Catena, PA-C 04/08/24 1229

## 2024-04-11 ENCOUNTER — Ambulatory Visit (INDEPENDENT_AMBULATORY_CARE_PROVIDER_SITE_OTHER): Admitting: Internal Medicine

## 2024-04-11 ENCOUNTER — Encounter: Payer: Self-pay | Admitting: Internal Medicine

## 2024-04-11 VITALS — BP 128/84 | HR 52 | Temp 98.0°F | Ht 73.0 in | Wt 300.0 lb

## 2024-04-11 DIAGNOSIS — E538 Deficiency of other specified B group vitamins: Secondary | ICD-10-CM

## 2024-04-11 DIAGNOSIS — J449 Chronic obstructive pulmonary disease, unspecified: Secondary | ICD-10-CM

## 2024-04-11 DIAGNOSIS — I10 Essential (primary) hypertension: Secondary | ICD-10-CM

## 2024-04-11 DIAGNOSIS — J309 Allergic rhinitis, unspecified: Secondary | ICD-10-CM

## 2024-04-11 DIAGNOSIS — R7302 Impaired glucose tolerance (oral): Secondary | ICD-10-CM

## 2024-04-11 DIAGNOSIS — R42 Dizziness and giddiness: Secondary | ICD-10-CM

## 2024-04-11 DIAGNOSIS — N1831 Chronic kidney disease, stage 3a: Secondary | ICD-10-CM

## 2024-04-11 DIAGNOSIS — E559 Vitamin D deficiency, unspecified: Secondary | ICD-10-CM

## 2024-04-11 NOTE — Progress Notes (Signed)
 Patient ID: Henry Ellis, male   DOB: 1956/09/09, 68 y.o.   MRN: [id number]        Chief Complaint: follow up vertigo, leg swelling, copd, hyperglycemia, htn       HPI:  Henry Ellis is a 68 y.o. male here to f/u recent flare several episodes vertigo now much improved with meclizline 25 mg prn, Pt denies chest pain, increased sob or doe, wheezing, orthopnea, PND, palpitations, dizziness or syncope but has mild worsning leg swelling better at night, worst by next evening..   Pt denies polydipsia, polyuria, or new focal neuro s/s.    Pt denies fever, wt loss, night sweats, loss of appetite, or other constitutional symptoms  Does have several wks ongoing nasal allergy symptoms with clearish congestion, itch and sneezing, without fever, pain, ST, cough, swelling or wheezing.       Wt Readings from Last 3 Encounters:  04/11/24 300 lb (136.1 kg)  04/08/24 295 lb (133.8 kg)  03/21/24 (!) 303 lb 9.6 oz (137.7 kg)   BP Readings from Last 3 Encounters:  04/11/24 128/84  04/08/24 123/77  03/21/24 120/80         Past Medical History:  Diagnosis Date   Allergy    seasonal allergies   Anemia    on meds   Anxiety    hx of   Arthritis    generalized   Barrett's esophagus    Cancer (HCC) 10/17/2016   kidney cancer(removed left kidney in 2017)   Carpal tunnel syndrome, right 04/21/2011   Chronic bronchitis    Chronic kidney disease 2017   2017-had left kidney removed   ED (erectile dysfunction)    Esophageal reflux    on meds   Esophageal stricture    Essential hypertension 10/19/2015   on meds   H/O adenomatous polyp of colon 06/05/2012   Hiatal hernia    High cholesterol    on meds   History of Bell's palsy    IBS (irritable bowel syndrome)    Personal history of colonic polyps 04/15/2008 & 04/22/2012   TUBULAR ADENOMA   Ulcerative esophagitis    Past Surgical History:  Procedure Laterality Date   CARPAL TUNNEL RELEASE     left   INGUINAL HERNIA REPAIR     bilateral   KNEE  ARTHROSCOPY     right Dr Hazeline Lister   ROBOT ASSISTED LAPAROSCOPIC NEPHRECTOMY Left 10/18/2016   Procedure: XI ROBOTIC ASSISTED LAPAROSCOPIC RADICAL NEPHRECTOMY;  Surgeon: Osborn Blaze, MD;  Location: [facility];  Service: Urology;  Laterality: Left;   WISDOM TOOTH EXTRACTION      reports that he quit smoking about 7 years ago. His smoking use included cigarettes. He has never used smokeless tobacco. He reports current alcohol use of about 3.0 standard drinks of alcohol per week. He reports that he does not currently use drugs after having used the following drugs: Marijuana. Frequency: 2.00 times per week. family history includes Heart disease in his father; Hypertension in his brother; Lung cancer in his father. Allergies  Allergen Reactions   Bee Venom Anaphylaxis   Amoxicillin Rash    Did PCN reaction causing immediate rash, facial/tongue/throat swelling, SOB or lightheadedness with hypotension: no Did PCN reaction causing severe rash involving mucus membranes or skin necrosis: no Did PCN reaction that required hospitalization : no Did pcn reaction occurring within the last 10 years: no If all of the above answers are "NO", then may proceed with Cephalosporin use. Pt states he can take  low doses of amoxicillin, has taken for dental procedures without reaction   Apixaban  Itching   Aspirin Nausea Only   Other Rash    Aspirin cream    Current Outpatient Medications on File Prior to Visit  Medication Sig Dispense Refill   atorvastatin  (LIPITOR) 40 MG tablet Take 1 tablet (40 mg total) by mouth daily. 90 tablet 3   Cholecalciferol (VITAMIN D3 PO) Take 5,000 Units by mouth daily.     Cyanocobalamin  (VITAMIN B-12 PO) Take by mouth daily.     dapagliflozin  propanediol (FARXIGA ) 10 MG TABS tablet Take 1 tablet (10 mg total) by mouth daily before breakfast. 90 tablet 3   docusate sodium  (COLACE) 100 MG capsule Take 100 mg by mouth daily.     esomeprazole  (NEXIUM ) 20 MG capsule Take 1 capsule (20 mg  total) by mouth daily at 12 noon. 90 capsule 3   ferrous sulfate 325 (65 FE) MG tablet Take 325 mg by mouth daily with breakfast.     finasteride  (PROSCAR ) 5 MG tablet Take 1 tablet (5 mg total) by mouth daily. 90 tablet 3   losartan  (COZAAR ) 50 MG tablet Take 1 tablet (50 mg total) by mouth daily. 90 tablet 3   meclizine  (ANTIVERT ) 25 MG tablet Take 1 tablet (25 mg total) by mouth 3 (three) times daily as needed for dizziness. 30 tablet 0   ondansetron  (ZOFRAN ) 4 MG tablet Take 1 tablet (4 mg total) by mouth every 6 (six) hours. 12 tablet 0   Psyllium 30.9 % POWD Take 1 packet by mouth daily.     solifenacin  (VESICARE ) 5 MG tablet Take 1 tablet (5 mg total) by mouth daily. 90 tablet 3   torsemide  (DEMADEX ) 20 MG tablet Take 1 tablet (20 mg total) by mouth daily. 90 tablet 3   XARELTO  20 MG TABS tablet TAKE 1 TABLET(20 MG) BY MOUTH DAILY WITH SUPPER 90 tablet 1   No current facility-administered medications on file prior to visit.        ROS:  All others reviewed and negative.  Objective        PE:  BP 128/84 (BP Location: Left Arm, Patient Position: Sitting, Cuff Size: Normal)   Pulse (!) 52   Temp 98 F (36.7 C) (Oral)   Ht 6\' 1"  (1.854 m)   Wt 300 lb (136.1 kg)   SpO2 100%   BMI 39.58 kg/m                 Constitutional: Pt appears in NAD               HENT: Head: NCAT.                Right Ear: External ear normal.                 Left Ear: External ear normal.  Bilat Tm's clear               Eyes: . Pupils are equal, round, and reactive to light. Conjunctivae and EOM are normal               Nose: without d/c or deformity               Neck: Neck supple. Gross normal ROM               Cardiovascular: Normal rate and regular rhythm.                 Pulmonary/Chest:  Effort normal and breath sounds without rales or wheezing.                Abd:  Soft, NT, ND, + BS, no organomegaly               Neurological: Pt is alert. At baseline orientation, motor grossly intact                Skin: Skin is warm. No rashes, no other new lesions, LE edema - trace bilateral               Psychiatric: Pt behavior is normal without agitation   Micro: none  Cardiac tracings I have personally interpreted today:  none  Pertinent Radiological findings (summarize): none   Lab Results  Component Value Date   WBC 6.0 04/08/2024   HGB 12.2 (L) 04/08/2024   HCT 37.6 (L) 04/08/2024   PLT 189 04/08/2024   GLUCOSE 102 (H) 04/08/2024   CHOL 112 02/20/2024   TRIG 92.0 02/20/2024   HDL 52.40 02/20/2024   LDLDIRECT 151.7 03/25/2010   LDLCALC 41 02/20/2024   ALT 16 02/20/2024   AST 13 02/20/2024   NA 138 04/08/2024   K 4.3 04/08/2024   CL 101 04/08/2024   CREATININE 1.61 (H) 04/08/2024   BUN 25 (H) 04/08/2024   CO2 27 04/08/2024   TSH 1.012 04/08/2024   PSA 1.45 02/20/2024   HGBA1C 6.1 02/20/2024   MICROALBUR 2.6 (H) 02/20/2024   Assessment/Plan:  Henry Ellis is a 68 y.o. White or Caucasian [1] male with  has a past medical history of Allergy, Anemia, Anxiety, Arthritis, Barrett's esophagus, Cancer (HCC) (10/17/2016), Carpal tunnel syndrome, right (04/21/2011), Chronic bronchitis, Chronic kidney disease (2017), ED (erectile dysfunction), Esophageal reflux, Esophageal stricture, Essential hypertension (10/19/2015), H/O adenomatous polyp of colon (06/05/2012), Hiatal hernia, High cholesterol, History of Bell's palsy, IBS (irritable bowel syndrome), Personal history of colonic polyps (04/15/2008 & 04/22/2012), and Ulcerative esophagitis.  COPD (chronic obstructive pulmonary disease) (HCC) Stable overall, cont inhaler asd  Impaired glucose tolerance Lab Results  Component Value Date   HGBA1C 6.1 02/20/2024   Stable, pt to continue current medical treatment farxiga  10 qd   Essential hypertension BP Readings from Last 3 Encounters:  04/11/24 128/84  04/08/24 123/77  03/21/24 120/80   Stable, pt to continue medical treatment losartan  50 mg qd   CKD (chronic kidney  disease) Lab Results  Component Value Date   CREATININE 1.61 (H) 04/08/2024   Stable overall, cont to avoid nephrotoxins   Vitamin D  deficiency Last vitamin D  Lab Results  Component Value Date   VD25OH 53.77 02/20/2024   Stable, cont oral replacement   B12 deficiency Lab Results  Component Value Date   VITAMINB12 857 02/20/2024   Stable, cont oral replacement - b12 1000 mcg qd   Vertigo Much improved, exam benign, cont meclizine  prn  Allergic rhinitis Mild to mod, for otc allegra and or nasacort  asd,  to f/u any worsening symptoms or concerns  Followup: Return if symptoms worsen or fail to improve.  Rosalia Colonel, MD 04/12/2024 2:25 PM [facility]

## 2024-04-11 NOTE — Patient Instructions (Signed)
 Please continue all other medications as before, including the meclizine  for any further symptoms  Please have the pharmacy call with any other refills you may need.  Please continue your efforts at being more active, low cholesterol diet, and weight control.  Please keep your appointments with your specialists as you may have planned

## 2024-04-12 ENCOUNTER — Encounter: Payer: Self-pay | Admitting: Internal Medicine

## 2024-04-12 NOTE — Assessment & Plan Note (Signed)
 Last vitamin D Lab Results  Component Value Date   VD25OH 53.77 02/20/2024   Stable, cont oral replacement

## 2024-04-12 NOTE — Assessment & Plan Note (Signed)
 Much improved, exam benign, cont meclizine  prn

## 2024-04-12 NOTE — Assessment & Plan Note (Signed)
 BP Readings from Last 3 Encounters:  04/11/24 128/84  04/08/24 123/77  03/21/24 120/80   Stable, pt to continue medical treatment losartan  50 mg qd

## 2024-04-12 NOTE — Assessment & Plan Note (Signed)
 Mild to mod, for otc allegra and/or nasacort asd,  to f/u any worsening symptoms or concerns

## 2024-04-12 NOTE — Assessment & Plan Note (Signed)
Stable overall, cont inhaler asd

## 2024-04-12 NOTE — Assessment & Plan Note (Signed)
 Lab Results  Component Value Date   VITAMINB12 857 02/20/2024   Stable, cont oral replacement - b12 1000 mcg qd

## 2024-04-12 NOTE — Assessment & Plan Note (Signed)
 Lab Results  Component Value Date   HGBA1C 6.1 02/20/2024   Stable, pt to continue current medical treatment farxiga 10 qd

## 2024-04-12 NOTE — Assessment & Plan Note (Signed)
 Lab Results  Component Value Date   CREATININE 1.61 (H) 04/08/2024   Stable overall, cont to avoid nephrotoxins

## 2024-04-29 DIAGNOSIS — K080 Exfoliation of teeth due to systemic causes: Secondary | ICD-10-CM | POA: Diagnosis not present

## 2024-05-13 ENCOUNTER — Ambulatory Visit: Payer: Self-pay | Admitting: Podiatry

## 2024-05-13 ENCOUNTER — Encounter: Payer: Self-pay | Admitting: Podiatry

## 2024-05-13 DIAGNOSIS — M79671 Pain in right foot: Secondary | ICD-10-CM | POA: Diagnosis not present

## 2024-05-13 DIAGNOSIS — B351 Tinea unguium: Secondary | ICD-10-CM

## 2024-05-13 DIAGNOSIS — M79672 Pain in left foot: Secondary | ICD-10-CM

## 2024-05-13 NOTE — Progress Notes (Signed)
 Patient presents for evaluation and treatment of tenderness and some redness around nails feet.  Tenderness around toes with walking and wearing shoes.  Physical exam:  General appearance: Alert, pleasant, and in no acute distress.  Vascular: Pedal pulses: DP palpable laterally, PT nonpalpable.  Mild edema lower legs bilaterally  Neurological:  No paresthesias or burning noted.  Dermatologic:  Nails thickened, disfigured, discolored 1-5 BL with subungual debris.  Redness and hypertrophic nail folds along nail folds bilaterally but no signs of drainage or infection.  Skin thin and atrophic with no hair growth in lower extremity.  Areas of hyperpigmentation bilaterally  Musculoskeletal:  Hammertoes 2 through 5 bilaterally   Diagnosis: 1. Painful onychomycotic nails 1 through 5 bilaterally. 2. Pain toes 1 through 5 bilaterally.  Plan: Debrided onychomycotic nails 1 through 5 bilaterally.  Return 3 months

## 2024-06-09 DIAGNOSIS — D6869 Other thrombophilia: Secondary | ICD-10-CM | POA: Diagnosis not present

## 2024-06-09 DIAGNOSIS — E785 Hyperlipidemia, unspecified: Secondary | ICD-10-CM | POA: Diagnosis not present

## 2024-07-14 ENCOUNTER — Ambulatory Visit (INDEPENDENT_AMBULATORY_CARE_PROVIDER_SITE_OTHER): Payer: Medicare Other

## 2024-07-14 VITALS — Ht 73.0 in | Wt 300.0 lb

## 2024-07-14 DIAGNOSIS — Z0000 Encounter for general adult medical examination without abnormal findings: Secondary | ICD-10-CM

## 2024-07-14 NOTE — Patient Instructions (Signed)
 Mr. Henry Ellis , Thank you for taking time out of your busy schedule to complete your Annual Wellness Visit with me. I enjoyed our conversation and look forward to speaking with you again next year. I, as well as your care team,  appreciate your ongoing commitment to your health goals. Please review the following plan we discussed and let me know if I can assist you in the future. Your Game plan/ To Do List    Follow up Visits: Next Medicare AWV with our clinical staff: 07/15/2025.   Have you seen your provider in the last 6 months (3 months if uncontrolled diabetes)? Yes Next Office Visit with your provider: 08/28/2024.  Clinician Recommendations:  Aim for 30 minutes of exercise or brisk walking, 6-8 glasses of water, and 5 servings of fruits and vegetables each day. Keep up the good work.      This is a list of the screening recommended for you and due dates:  Health Maintenance  Topic Date Due   Flu Shot  07/18/2024   Hemoglobin A1C  08/22/2024   Yearly kidney health urinalysis for diabetes  02/19/2025   Complete foot exam   02/25/2025   Eye exam for diabetics  04/03/2025   Yearly kidney function blood test for diabetes  04/08/2025   Medicare Annual Wellness Visit  07/14/2025   Colon Cancer Screening  01/23/2026   DTaP/Tdap/Td vaccine (4 - Td or Tdap) 07/31/2029   Pneumococcal Vaccine for age over 79  Completed   Hepatitis C Screening  Completed   Zoster (Shingles) Vaccine  Completed   Hepatitis B Vaccine  Aged Out   HPV Vaccine  Aged Out   Meningitis B Vaccine  Aged Out   COVID-19 Vaccine  Discontinued    Advanced directives: (In Chart) A copy of your advanced directives are scanned into your chart should your provider ever need it. Advance Care Planning is important because it:  [x]  Makes sure you receive the medical care that is consistent with your values, goals, and preferences  [x]  It provides guidance to your family and loved ones and reduces their decisional burden about  whether or not they are making the right decisions based on your wishes.  Follow the link provided in your after visit summary or read over the paperwork we have mailed to you to help you started getting your Advance Directives in place. If you need assistance in completing these, please reach out to us  so that we can help you!  See attachments for Preventive Care and Fall Prevention Tips.

## 2024-07-14 NOTE — Progress Notes (Signed)
 Subjective:   Brok Stocking is a 68 y.o. who presents for a Medicare Wellness preventive visit.  As a reminder, Annual Wellness Visits don't include a physical exam, and some assessments may be limited, especially if this visit is performed virtually. We may recommend an in-person follow-up visit with your provider if needed.  Visit Complete: Virtual I connected with  Deward Modesto Akin on 07/14/24 by a audio enabled telemedicine application and verified that I am speaking with the correct person using two identifiers.  Patient Location: Home  Provider Location: Home Office  I discussed the limitations of evaluation and management by telemedicine. The patient expressed understanding and agreed to proceed.  Vital Signs: Because this visit was a virtual/telehealth visit, some criteria may be missing or patient reported. Any vitals not documented were not able to be obtained and vitals that have been documented are patient reported.  VideoDeclined- This patient declined Librarian, academic. Therefore the visit was completed with audio only.  Persons Participating in Visit: Patient.  AWV Questionnaire: No: Patient Medicare AWV questionnaire was not completed prior to this visit.  Cardiac Risk Factors include: advanced age (>44men, >70 women);dyslipidemia;obesity (BMI >30kg/m2);Other (see comment);male gender;hypertension, Risk factor comments: CKD,     Objective:    Today's Vitals   07/14/24 0808  Weight: 300 lb (136.1 kg)  Height: 6' 1 (1.854 m)   Body mass index is 39.58 kg/m.     07/14/2024    8:20 AM 04/08/2024   12:23 PM 07/11/2023    8:50 AM 07/07/2022    1:39 PM 06/29/2021   11:56 AM 07/09/2017    9:31 AM 06/25/2017    9:56 AM  Advanced Directives  Does Patient Have a Medical Advance Directive? Yes No Yes Yes Yes Yes  Yes   Type of Estate agent of [place];Living will  Healthcare Power of [place];Living will Healthcare  Power of [place];Living will   Living will;Healthcare Power of Attorney  Does patient want to make changes to medical advance directive? No - Patient declined  No - Patient declined  No - Patient declined    Copy of Healthcare Power of Attorney in Chart? Yes - validated most recent copy scanned in chart (See row information)  Yes - validated most recent copy scanned in chart (See row information) No - copy requested        Data saved with a previous flowsheet row definition    Current Medications (verified) Outpatient Encounter Medications as of 07/14/2024  Medication Sig   atorvastatin  (LIPITOR) 40 MG tablet Take 1 tablet (40 mg total) by mouth daily.   Cholecalciferol (VITAMIN D3 PO) Take 5,000 Units by mouth daily.   Cyanocobalamin  (VITAMIN B-12 PO) Take by mouth daily.   dapagliflozin  propanediol (FARXIGA ) 10 MG TABS tablet Take 1 tablet (10 mg total) by mouth daily before breakfast.   docusate sodium  (COLACE) 100 MG capsule Take 100 mg by mouth daily.   esomeprazole  (NEXIUM ) 20 MG capsule Take 1 capsule (20 mg total) by mouth daily at 12 noon.   ferrous sulfate 325 (65 FE) MG tablet Take 325 mg by mouth daily with breakfast.   finasteride  (PROSCAR ) 5 MG tablet Take 1 tablet (5 mg total) by mouth daily.   losartan  (COZAAR ) 50 MG tablet Take 1 tablet (50 mg total) by mouth daily.   meclizine  (ANTIVERT ) 25 MG tablet Take 1 tablet (25 mg total) by mouth 3 (three) times daily as needed for dizziness.   ondansetron  (ZOFRAN )  4 MG tablet Take 1 tablet (4 mg total) by mouth every 6 (six) hours.   Psyllium 30.9 % POWD Take 1 packet by mouth daily.   solifenacin  (VESICARE ) 5 MG tablet Take 1 tablet (5 mg total) by mouth daily.   XARELTO  20 MG TABS tablet TAKE 1 TABLET(20 MG) BY MOUTH DAILY WITH SUPPER   torsemide  (DEMADEX ) 20 MG tablet Take 1 tablet (20 mg total) by mouth daily. (Patient not taking: Reported on 07/14/2024)   No facility-administered encounter medications on file as of 07/14/2024.     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   Chronic bronchitis    Chronic kidney disease 2017   2017-had left kidney removed   ED (erectile dysfunction)    Esophageal reflux    on meds   Esophageal stricture    Essential hypertension 10/19/2015   on meds   H/O adenomatous polyp of colon 06/05/2012   Hiatal hernia    High cholesterol    on meds   History of Bell's palsy    IBS (irritable bowel syndrome)    Personal history of colonic polyps 04/15/2008 & 04/22/2012   TUBULAR ADENOMA   Ulcerative esophagitis    Past Surgical History:  Procedure Laterality Date   CARPAL TUNNEL RELEASE     left   INGUINAL HERNIA REPAIR     bilateral   KNEE ARTHROSCOPY     right Dr Gerome   ROBOT ASSISTED LAPAROSCOPIC NEPHRECTOMY Left 10/18/2016   Procedure: XI ROBOTIC ASSISTED LAPAROSCOPIC RADICAL NEPHRECTOMY;  Surgeon: Ricardo Likens, MD;  Location: [facility];  Service: Urology;  Laterality: Left;   WISDOM TOOTH EXTRACTION     Family History  Problem Relation Age of Onset   Heart disease Father    Lung cancer Father    Hypertension Brother        THROUGHOUT FAMILY   Colon cancer Neg Hx    Stomach cancer Neg Hx    Esophageal cancer Neg Hx    Rectal cancer Neg Hx    Colon polyps Neg Hx    Social History   Socioeconomic History   Marital status: Single    Spouse name: Not on file   Number of children: Not on file   Years of education: Not on file   Highest education level: Not on file  Occupational History   Occupation: Curator    Employer: [employer]   Occupation: RETIRED  Tobacco Use   Smoking status: Former    Current packs/day: 0.00    Types:  Cigarettes    Quit date: 10/17/2016    Years since quitting: 7.7   Smokeless tobacco: Never   Tobacco comments:    Counseling sheet given 03-2012   Vaping Use   Vaping status: Former  Substance and Sexual Activity   Alcohol use: Yes    Alcohol/week: 3.0 standard drinks of alcohol    Types: 3 Shots of liquor per week   Drug use: Not Currently    Frequency: 2.0 times per week    Types: Marijuana    Comment: last use June 05, 2016  Sexual activity: Not on file  Other Topics Concern   Not on file  Social History Narrative   Lives alone/2025   Social Drivers of Health   Financial Resource Strain: Low Risk  (07/14/2024)   Overall Financial Resource Strain (CARDIA)    Difficulty of Paying Living Expenses: Not hard at all  Food Insecurity: No Food Insecurity (07/14/2024)   Hunger Vital Sign    Worried About Running Out of Food in the Last Year: Never true    Ran Out of Food in the Last Year: Never true  Transportation Needs: No Transportation Needs (07/14/2024)   PRAPARE - Administrator, Civil Service (Medical): No    Lack of Transportation (Non-Medical): No  Physical Activity: Sufficiently Active (07/14/2024)   Exercise Vital Sign    Days of Exercise per Week: 4 days    Minutes of Exercise per Session: 40 min  Stress: No Stress Concern Present (07/14/2024)   [employer] of Occupational Health - Occupational Stress Questionnaire    Feeling of Stress: Not at all  Social Connections: Socially Isolated (07/14/2024)   Social Connection and Isolation Panel    Frequency of Communication with Friends and Family: More than three times a week    Frequency of Social Gatherings with Friends and Family: More than three times a week    Attends Religious Services: Never    Database administrator or Organizations: No    Attends Banker Meetings: Never    Marital Status: Never married    Tobacco Counseling Counseling given: Not Answered Tobacco comments:  Counseling sheet given 03-2012     Clinical Intake:  Pre-visit preparation completed: Yes  Pain : No/denies pain     BMI - recorded: 39.58 Nutritional Risks: None Diabetes: No  Lab Results  Component Value Date   HGBA1C 6.1 02/20/2024   HGBA1C 6.2 08/23/2023   HGBA1C 6.0 02/22/2023     How often do you need to have someone help you when you read instructions, pamphlets, or other written materials from your doctor or pharmacy?: 1 - Never  Interpreter Needed?: No  Information entered by :: Minetta Krisher, RMA   Activities of Daily Living     07/14/2024    8:19 AM  In your present state of health, do you have any difficulty performing the following activities:  Hearing? 1  Comment has some hearing loss  Vision? 0  Difficulty concentrating or making decisions? 0  Walking or climbing stairs? 0  Dressing or bathing? 0  Doing errands, shopping? 0  Preparing Food and eating ? N  Using the Toilet? N  In the past six months, have you accidently leaked urine? N  Do you have problems with loss of bowel control? N  Managing your Medications? N  Managing your Finances? N  Housekeeping or managing your Housekeeping? N    Patient Care Team: Norleen Lynwood ORN, MD as PCP - General (Internal Medicine) Santo Stanly LABOR, MD as PCP - Cardiology (Cardiology) Cleotilde Sewer, OD as Consulting Physician (Optometry)  I have updated your Care Teams any recent Medical Services you may have received from other providers in the past year.     Assessment:   This is a routine wellness examination for [place].  Hearing/Vision screen Hearing Screening - Comments:: Has some hearing loss Vision Screening - Comments:: Wears eyeglasses/ Dr. Sewer cleotilde   Goals Addressed             This Visit's Progress  My healthcare goal for 2024 is to maintain my current health status by continuing to eat healthy, stay independent, physically and socially active.   On track      Depression  Screen     07/14/2024    8:27 AM 04/11/2024    9:56 AM 02/26/2024    8:13 AM 08/29/2023    8:06 AM 07/11/2023    8:51 AM 08/28/2022    8:02 AM 07/07/2022    1:43 PM  PHQ 2/9 Scores  PHQ - 2 Score 0 0 0 0 0 0 0  PHQ- 9 Score 0   0 0 0     Fall Risk     07/14/2024    8:24 AM 04/11/2024   10:00 AM 02/26/2024    8:18 AM 08/29/2023    8:06 AM 07/11/2023    8:50 AM  Fall Risk   Falls in the past year? 0 0 0 0 0  Number falls in past yr: 0 0 0 0 0  Injury with Fall? 0 0 0 0 0  Risk for fall due to :  No Fall Risks No Fall Risks No Fall Risks No Fall Risks  Follow up Falls evaluation completed;Falls prevention discussed Falls evaluation completed Falls evaluation completed Falls evaluation completed Falls prevention discussed    MEDICARE RISK AT HOME:  Medicare Risk at Home Any stairs in or around the home?: No If so, are there any without handrails?: Yes (back of the home) Home free of loose throw rugs in walkways, pet beds, electrical cords, etc?: Yes Adequate lighting in your home to reduce risk of falls?: Yes Life alert?: No Use of a cane, walker or w/c?: Yes (uses cane when had Veritgo) Grab bars in the bathroom?: No Shower chair or bench in shower?: No Elevated toilet seat or a handicapped toilet?: No  TIMED UP AND GO:  Was the test performed?  No  Cognitive Function: Declined/Normal: No cognitive concerns noted by patient or family. Patient alert, oriented, able to answer questions appropriately and recall recent events. No signs of memory loss or confusion.        07/11/2023    8:51 AM  6CIT Screen  What Year? 0 points  What month? 0 points  What time? 0 points  Count back from 20 0 points  Months in reverse 0 points  Repeat phrase 0 points  Total Score 0 points    Immunizations Immunization History  Administered Date(s) Administered   Fluad Quad(high Dose 65+) 09/16/2020, 08/26/2021, 08/28/2022   Fluad Trivalent(High Dose 65+) 08/29/2023   Influenza Split  09/27/2012   Influenza Whole 09/07/2008, 12/04/2011   Influenza,inj,Quad PF,6+ Mos 10/03/2013, 10/09/2014, 10/19/2016, 01/25/2018   Influenza-Unspecified 08/30/2018   PFIZER(Purple Top)SARS-COV-2 Vaccination 01/09/2020, 01/30/2020, 01/25/2021   Pneumococcal Conjugate-13 08/01/2019   Pneumococcal Polysaccharide-23 03/05/2009, 10/19/2016, 02/24/2022   Td 03/05/2009   Td (Adult), 2 Lf Tetanus Toxid, Preservative Free 03/05/2009   Tdap 08/01/2019   Zoster Recombinant(Shingrix) 09/25/2022, 11/27/2022   Zoster, Live 04/16/2015    Screening Tests Health Maintenance  Topic Date Due   INFLUENZA VACCINE  07/18/2024   HEMOGLOBIN A1C  08/22/2024   Diabetic kidney evaluation - Urine ACR  02/19/2025   FOOT EXAM  02/25/2025   OPHTHALMOLOGY EXAM  04/03/2025   Diabetic kidney evaluation - eGFR measurement  04/08/2025   Medicare Annual Wellness (AWV)  07/14/2025   Colonoscopy  01/23/2026   DTaP/Tdap/Td (4 - Td or Tdap) 07/31/2029   Pneumococcal Vaccine: 50+ Years  Completed  Hepatitis C Screening  Completed   Zoster Vaccines- Shingrix  Completed   Hepatitis B Vaccines  Aged Out   HPV VACCINES  Aged Out   Meningococcal B Vaccine  Aged Out   COVID-19 Vaccine  Discontinued    Health Maintenance  There are no preventive care reminders to display for this patient. Health Maintenance Items Addressed: See Nurse Notes at the end of this note  Additional Screening:  Vision Screening: Recommended annual ophthalmology exams for early detection of glaucoma and other disorders of the eye. Would you like a referral to an eye doctor? No    Dental Screening: Recommended annual dental exams for proper oral hygiene  Community Resource Referral / Chronic Care Management: CRR required this visit?  No   CCM required this visit?  No   Plan:    I have personally reviewed and noted the following in the patient's chart:   Medical and social history Use of alcohol, tobacco or illicit drugs  Current  medications and supplements including opioid prescriptions. Patient is not currently taking opioid prescriptions. Functional ability and status Nutritional status Physical activity Advanced directives List of other physicians Hospitalizations, surgeries, and ER visits in previous 12 months Vitals Screenings to include cognitive, depression, and falls Referrals and appointments  In addition, I have reviewed and discussed with patient certain preventive protocols, quality metrics, and best practice recommendations. A written personalized care plan for preventive services as well as general preventive health recommendations were provided to patient.   Ludean Duhart L Zareya Tuckett, CMA   07/14/2024   After Visit Summary: (Mail) Due to this being a telephonic visit, the after visit summary with patients personalized plan was offered to patient via mail   Notes: Patient stated that he has stopped taking Torsemide  20mg , in late May 2025/early June.  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   Patient wanted Dr. Norleen to be aware that he is no longer taking that medication.  Patient is up to date on all health maintenance.

## 2024-07-30 DIAGNOSIS — K080 Exfoliation of teeth due to systemic causes: Secondary | ICD-10-CM | POA: Diagnosis not present

## 2024-08-13 ENCOUNTER — Encounter: Payer: Self-pay | Admitting: Podiatry

## 2024-08-13 ENCOUNTER — Ambulatory Visit (INDEPENDENT_AMBULATORY_CARE_PROVIDER_SITE_OTHER): Admitting: Podiatry

## 2024-08-13 DIAGNOSIS — B351 Tinea unguium: Secondary | ICD-10-CM | POA: Diagnosis not present

## 2024-08-13 DIAGNOSIS — M79672 Pain in left foot: Secondary | ICD-10-CM | POA: Diagnosis not present

## 2024-08-13 DIAGNOSIS — M79671 Pain in right foot: Secondary | ICD-10-CM

## 2024-08-13 NOTE — Progress Notes (Addendum)
 Patient presents for evaluation and treatment of tenderness and some redness around nails feet.  Tenderness around toes with walking and wearing shoes.  Physical exam:  General appearance: Alert, pleasant, and in no acute distress.  Vascular: Pedal pulses: DP 2/4 B/L, PT 0/4 B/L. Mild edema lower legs bilaterally  Neurological:    Dermatologic:  Nails thickened, disfigured, discolored 1-5 BL with subungual debris.  Redness and hypertrophic nail folds along nail folds bilaterally but no signs of drainage or infection.  Musculoskeletal:     Diagnosis: 1. Painful onychomycotic nails 1 through 5 bilaterally. 2. Pain toes 1 through 5 bilaterally.  Plan: Debrided onychomycotic nails 1 through 5 bilaterally.  Sharply debrided nails with nail nipper and reduced with power bur.  Return 3 months RFC

## 2024-08-21 ENCOUNTER — Other Ambulatory Visit (INDEPENDENT_AMBULATORY_CARE_PROVIDER_SITE_OTHER)

## 2024-08-21 ENCOUNTER — Ambulatory Visit: Payer: Self-pay | Admitting: Internal Medicine

## 2024-08-21 DIAGNOSIS — R7302 Impaired glucose tolerance (oral): Secondary | ICD-10-CM

## 2024-08-21 DIAGNOSIS — E7800 Pure hypercholesterolemia, unspecified: Secondary | ICD-10-CM | POA: Diagnosis not present

## 2024-08-21 DIAGNOSIS — E559 Vitamin D deficiency, unspecified: Secondary | ICD-10-CM | POA: Diagnosis not present

## 2024-08-21 LAB — BASIC METABOLIC PANEL WITH GFR
BUN: 24 mg/dL — ABNORMAL HIGH (ref 6–23)
CO2: 25 meq/L (ref 19–32)
Calcium: 9.3 mg/dL (ref 8.4–10.5)
Chloride: 105 meq/L (ref 96–112)
Creatinine, Ser: 1.55 mg/dL — ABNORMAL HIGH (ref 0.40–1.50)
GFR: 45.16 mL/min — ABNORMAL LOW (ref >60.00)
Glucose, Bld: 98 mg/dL (ref 70–99)
Potassium: 4.7 meq/L (ref 3.5–5.1)
Sodium: 139 meq/L (ref 135–145)

## 2024-08-21 LAB — LIPID PANEL
Cholesterol: 94 mg/dL (ref 0–200)
HDL: 43.1 mg/dL (ref >39.00)
LDL Cholesterol: 37 mg/dL (ref 0–99)
NonHDL: 51.29
Total CHOL/HDL Ratio: 2
Triglycerides: 72 mg/dL (ref 0.0–149.0)
VLDL: 14.4 mg/dL (ref 0.0–40.0)

## 2024-08-21 LAB — HEPATIC FUNCTION PANEL
ALT: 18 U/L (ref 0–53)
AST: 15 U/L (ref 0–37)
Albumin: 4 g/dL (ref 3.5–5.2)
Alkaline Phosphatase: 69 U/L (ref 39–117)
Bilirubin, Direct: 0.2 mg/dL (ref 0.0–0.3)
Total Bilirubin: 0.5 mg/dL (ref 0.2–1.2)
Total Protein: 7.3 g/dL (ref 6.0–8.3)

## 2024-08-21 LAB — VITAMIN D 25 HYDROXY (VIT D DEFICIENCY, FRACTURES): VITD: 65.53 ng/mL (ref 30.00–100.00)

## 2024-08-21 LAB — HEMOGLOBIN A1C: Hgb A1c MFr Bld: 6.3 % (ref 4.6–6.5)

## 2024-08-28 ENCOUNTER — Ambulatory Visit: Admitting: Internal Medicine

## 2024-08-28 ENCOUNTER — Encounter: Payer: Self-pay | Admitting: Internal Medicine

## 2024-08-28 VITALS — BP 114/72 | HR 50 | Temp 97.7°F | Ht 73.0 in | Wt 300.8 lb

## 2024-08-28 DIAGNOSIS — E7800 Pure hypercholesterolemia, unspecified: Secondary | ICD-10-CM

## 2024-08-28 DIAGNOSIS — R7302 Impaired glucose tolerance (oral): Secondary | ICD-10-CM

## 2024-08-28 DIAGNOSIS — I10 Essential (primary) hypertension: Secondary | ICD-10-CM

## 2024-08-28 DIAGNOSIS — Z125 Encounter for screening for malignant neoplasm of prostate: Secondary | ICD-10-CM

## 2024-08-28 DIAGNOSIS — N1831 Chronic kidney disease, stage 3a: Secondary | ICD-10-CM

## 2024-08-28 DIAGNOSIS — E538 Deficiency of other specified B group vitamins: Secondary | ICD-10-CM | POA: Diagnosis not present

## 2024-08-28 DIAGNOSIS — Z23 Encounter for immunization: Secondary | ICD-10-CM | POA: Diagnosis not present

## 2024-08-28 DIAGNOSIS — E559 Vitamin D deficiency, unspecified: Secondary | ICD-10-CM

## 2024-08-28 NOTE — Assessment & Plan Note (Signed)
 Lab Results  Component Value Date   VITAMINB12 857 02/20/2024   Stable, cont oral replacement - b12 1000 mcg qd

## 2024-08-28 NOTE — Assessment & Plan Note (Signed)
 BP Readings from Last 3 Encounters:  08/28/24 114/72  04/11/24 128/84  04/08/24 123/77   Stable, pt to continue medical treatment losartan  50 mg qd

## 2024-08-28 NOTE — Assessment & Plan Note (Signed)
 Last vitamin D  Lab Results  Component Value Date   VD25OH 65.53 08/21/2024   Stable, cont oral replacement

## 2024-08-28 NOTE — Assessment & Plan Note (Signed)
 Lab Results  Component Value Date   LDLCALC 37 08/21/2024   Stable, pt to continue current statin lipitor 40 mg qd

## 2024-08-28 NOTE — Patient Instructions (Signed)
 You had the flu shot today  Please continue all other medications as before, and refills have been done if requested.  Please have the pharmacy call with any other refills you may need.  Please continue your efforts at being more active, low cholesterol diet, and weight control.  Please keep your appointments with your specialists as you may have planned - cardiology, and urology  Your lab work was great!  Please make an Appointment to return in 6 months, or sooner if needed, also with Lab Appointment for testing done 3-5 days before at the FIRST FLOOR Lab (so this is for TWO appointments - please see the scheduling desk as you leave)

## 2024-08-28 NOTE — Assessment & Plan Note (Signed)
 Lab Results  Component Value Date   HGBA1C 6.3 08/21/2024   Stable, pt to continue current medical treatment - farxiga  10 mg qd

## 2024-08-28 NOTE — Assessment & Plan Note (Signed)
 Lab Results  Component Value Date   CREATININE 1.55 (H) 08/21/2024   Stable overall, cont to avoid nephrotoxins

## 2024-08-28 NOTE — Addendum Note (Signed)
 Addended byBETHA LUCETTA CLEATRICE LELON on: 08/28/2024 10:20 AM   Modules accepted: Orders

## 2024-08-28 NOTE — Progress Notes (Signed)
 Chief Complaint: follow up HTN, HLD and hyperglycemia , CKD3a with solitary kidney,        HPI:  Henry Ellis is a 68 y.o. male here overall doing ok.  Pt denies chest pain, increased sob or doe, wheezing, orthopnea, PND, increased LE swelling, palpitations, dizziness or syncope.   Pt denies polydipsia, polyuria, or new focal neuro s/s.    Pt denies fever, wt loss, night sweats, loss of appetite, or other constitutional symptoms  In fact has difficult time with any wt loss with diet.  Pt admits slow in movements but no falls and declines need for PT.  Has new allergy to lidocaine   Due for flu shot.  Has f/u planned with cardiology and urology.  Did have episode urinary retention requiring treatment, he believes caused by torsemide , and has not been taking, but plans to f/u with cardiology soon.  Wt Readings from Last 3 Encounters:  08/28/24 (!) 300 lb 12.8 oz (136.4 kg)  07/14/24 300 lb (136.1 kg)  04/11/24 300 lb (136.1 kg)   BP Readings from Last 3 Encounters:  08/28/24 114/72  04/11/24 128/84  04/08/24 123/77         Past Medical History:  Diagnosis Date   Allergy    seasonal allergies   Anemia    on meds   Anxiety    hx of   Arthritis    generalized   Barrett's esophagus    Cancer (HCC) 10/17/2016   kidney cancer(removed left kidney in 2017)   Carpal tunnel syndrome, right 04/21/2011   Chronic bronchitis    Chronic kidney disease 2017   2017-had left kidney removed   ED (erectile dysfunction)    Esophageal reflux    on meds   Esophageal stricture    Essential hypertension 10/19/2015   on meds   H/O adenomatous polyp of colon 06/05/2012   Hiatal hernia    High cholesterol    on meds   History of Bell's palsy    IBS (irritable bowel syndrome)    Personal history of colonic polyps 04/15/2008 & 04/22/2012   TUBULAR ADENOMA   Ulcerative esophagitis    Past Surgical History:  Procedure Laterality Date   CARPAL TUNNEL RELEASE     left   INGUINAL HERNIA REPAIR      bilateral   KNEE ARTHROSCOPY     right Dr Gerome   ROBOT ASSISTED LAPAROSCOPIC NEPHRECTOMY Left 10/18/2016   Procedure: XI ROBOTIC ASSISTED LAPAROSCOPIC RADICAL NEPHRECTOMY;  Surgeon: Ricardo Likens, MD;  Location: [facility];  Service: Urology;  Laterality: Left;   WISDOM TOOTH EXTRACTION      reports that he quit smoking about 7 years ago. His smoking use included cigarettes. He has never used smokeless tobacco. He reports current alcohol use of about 3.0 standard drinks of alcohol per week. He reports that he does not currently use drugs after having used the following drugs: Marijuana. Frequency: 2.00 times per week. family history includes Heart disease in his father; Hypertension in his brother; Lung cancer in his father. Allergies  Allergen Reactions   Bee Venom Anaphylaxis   Amoxicillin Rash    Did PCN reaction causing immediate rash, facial/tongue/throat swelling, SOB or lightheadedness with hypotension: no Did PCN reaction causing severe rash involving mucus membranes or skin necrosis: no Did PCN reaction that required hospitalization : no Did pcn reaction occurring within the last 10 years: no If all of the above answers are NO, then may proceed with  Cephalosporin use. Pt states he can take low doses of amoxicillin, has taken for dental procedures without reaction   Apixaban  Itching   Aspirin Nausea Only   Lidocaine  Rash   Other Rash    Aspirin cream    Current Outpatient Medications on File Prior to Visit  Medication Sig Dispense Refill   atorvastatin  (LIPITOR) 40 MG tablet Take 1 tablet (40 mg total) by mouth daily. 90 tablet 3   Cholecalciferol (VITAMIN D3 PO) Take 5,000 Units by mouth daily.     Cyanocobalamin  (VITAMIN B-12 PO) Take by mouth daily.     dapagliflozin  propanediol (FARXIGA ) 10 MG TABS tablet Take 1 tablet (10 mg total) by mouth daily before breakfast. 90 tablet 3   docusate sodium  (COLACE) 100 MG capsule Take 100 mg by mouth daily.     esomeprazole   (NEXIUM ) 20 MG capsule Take 1 capsule (20 mg total) by mouth daily at 12 noon. 90 capsule 3   ferrous sulfate 325 (65 FE) MG tablet Take 325 mg by mouth daily with breakfast.     finasteride  (PROSCAR ) 5 MG tablet Take 1 tablet (5 mg total) by mouth daily. 90 tablet 3   losartan  (COZAAR ) 50 MG tablet Take 1 tablet (50 mg total) by mouth daily. 90 tablet 3   meclizine  (ANTIVERT ) 25 MG tablet Take 1 tablet (25 mg total) by mouth 3 (three) times daily as needed for dizziness. 30 tablet 0   ondansetron  (ZOFRAN ) 4 MG tablet Take 1 tablet (4 mg total) by mouth every 6 (six) hours. 12 tablet 0   Psyllium 30.9 % POWD Take 1 packet by mouth daily.     solifenacin  (VESICARE ) 5 MG tablet Take 1 tablet (5 mg total) by mouth daily. 90 tablet 3   XARELTO  20 MG TABS tablet TAKE 1 TABLET(20 MG) BY MOUTH DAILY WITH SUPPER 90 tablet 1   torsemide  (DEMADEX ) 20 MG tablet Take 1 tablet (20 mg total) by mouth daily. (Patient not taking: Reported on 08/28/2024) 90 tablet 3   No current facility-administered medications on file prior to visit.        ROS:  All others reviewed and negative.  Objective        PE:  BP 114/72   Pulse (!) 50   Temp 97.7 F (36.5 C)   Ht 6' 1 (1.854 m)   Wt (!) 300 lb 12.8 oz (136.4 kg)   SpO2 99%   BMI 39.69 kg/m                 Constitutional: Pt appears in NAD               HENT: Head: NCAT.                Right Ear: External ear normal.                 Left Ear: External ear normal.                Eyes: . Pupils are equal, round, and reactive to light. Conjunctivae and EOM are normal               Nose: without d/c or deformity               Neck: Neck supple. Gross normal ROM               Cardiovascular: Normal rate and regular rhythm.  Pulmonary/Chest: Effort normal and breath sounds without rales or wheezing.                Abd:  Soft, NT, ND, + BS, no organomegaly               Neurological: Pt is alert. At baseline orientation, motor grossly intact                Skin: Skin is warm. No rashes, no other new lesions, LE edema - trace to 1+ bilateral left > right                Psychiatric: Pt behavior is normal without agitation   Micro: none  Cardiac tracings I have personally interpreted today:  none  Pertinent Radiological findings (summarize): none   Lab Results  Component Value Date   WBC 6.0 04/08/2024   HGB 12.2 (L) 04/08/2024   HCT 37.6 (L) 04/08/2024   PLT 189 04/08/2024   GLUCOSE 98 08/21/2024   CHOL 94 08/21/2024   TRIG 72.0 08/21/2024   HDL 43.10 08/21/2024   LDLDIRECT 151.7 03/25/2010   LDLCALC 37 08/21/2024   ALT 18 08/21/2024   AST 15 08/21/2024   NA 139 08/21/2024   K 4.7 08/21/2024   CL 105 08/21/2024   CREATININE 1.55 (H) 08/21/2024   BUN 24 (H) 08/21/2024   CO2 25 08/21/2024   TSH 1.012 04/08/2024   PSA 1.45 02/20/2024   HGBA1C 6.3 08/21/2024   MICROALBUR 2.6 (H) 02/20/2024   Assessment/Plan:  Henry Ellis is a 68 y.o. White or Caucasian [1] male with  has a past medical history of Allergy, Anemia, Anxiety, Arthritis, Barrett's esophagus, Cancer (HCC) (10/17/2016), Carpal tunnel syndrome, right (04/21/2011), Chronic bronchitis, Chronic kidney disease (2017), ED (erectile dysfunction), Esophageal reflux, Esophageal stricture, Essential hypertension (10/19/2015), H/O adenomatous polyp of colon (06/05/2012), Hiatal hernia, High cholesterol, History of Bell's palsy, IBS (irritable bowel syndrome), Personal history of colonic polyps (04/15/2008 & 04/22/2012), and Ulcerative esophagitis.  B12 deficiency Lab Results  Component Value Date   VITAMINB12 857 02/20/2024   Stable, cont oral replacement - b12 1000 mcg qd   CKD (chronic kidney disease) Lab Results  Component Value Date   CREATININE 1.55 (H) 08/21/2024   Stable overall, cont to avoid nephrotoxins   Essential hypertension BP Readings from Last 3 Encounters:  08/28/24 114/72  04/11/24 128/84  04/08/24 123/77   Stable, pt to continue medical  treatment losartan  50 mg qd   HYPERCHOLESTEROLEMIA Lab Results  Component Value Date   LDLCALC 37 08/21/2024   Stable, pt to continue current statin lipitor 40 mg qd   Impaired glucose tolerance Lab Results  Component Value Date   HGBA1C 6.3 08/21/2024   Stable, pt to continue current medical treatment - farxiga  10 mg qd   Vitamin D  deficiency Last vitamin D  Lab Results  Component Value Date   VD25OH 65.53 08/21/2024   Stable, cont oral replacement  Followup: No follow-ups on file.  Lynwood Rush, MD 08/28/2024 9:11 AM  Medical Group [facility] - [facility]

## 2024-09-11 ENCOUNTER — Ambulatory Visit: Attending: Cardiology | Admitting: Internal Medicine

## 2024-09-11 VITALS — BP 120/60 | HR 42 | Ht 73.0 in | Wt 300.0 lb

## 2024-09-11 DIAGNOSIS — I4892 Unspecified atrial flutter: Secondary | ICD-10-CM

## 2024-09-11 DIAGNOSIS — I2510 Atherosclerotic heart disease of native coronary artery without angina pectoris: Secondary | ICD-10-CM

## 2024-09-11 DIAGNOSIS — I483 Typical atrial flutter: Secondary | ICD-10-CM | POA: Diagnosis not present

## 2024-09-11 MED ORDER — RIVAROXABAN 20 MG PO TABS: 20.0000 mg | ORAL_TABLET | Freq: Every day | ORAL | 1 refills | Status: AC

## 2024-09-11 NOTE — Patient Instructions (Signed)
 Medication Instructions:  Your physician recommends that you continue on your current medications as directed. Please refer to the Current Medication list given to you today.  *If you need a refill on your cardiac medications before your next appointment, please call your pharmacy*  Lab Work: NONE  If you have labs (blood work) drawn today and your tests are completely normal, you will receive your results only by: MyChart Message (if you have MyChart) OR A paper copy in the mail If you have any lab test that is abnormal or we need to change your treatment, we will call you to review the results.  Testing/Procedures: NONE  Follow-Up: At [facility], you and your health needs are our priority.  As part of our continuing mission to provide you with exceptional heart care, our providers are all part of one team.  This team includes your primary Cardiologist (physician) and Advanced Practice Providers or APPs (Physician Assistants and Nurse Practitioners) who all work together to provide you with the care you need, when you need it.  Your next appointment:   1 year(s)  Provider:   Stanly DELENA Leavens, MD or Glendia Ferrier, PA-C       We recommend signing up for the patient portal called MyChart.  Sign up information is provided on this After Visit Summary.  MyChart is used to connect with patients for Virtual Visits (Telemedicine).  Patients are able to view lab/test results, encounter notes, upcoming appointments, etc.  Non-urgent messages can be sent to your provider as well.   To learn more about what you can do with MyChart, go to [email or web address].

## 2024-09-11 NOTE — Progress Notes (Signed)
 Cardiology Office Note:    Date:  09/11/2024   ID:  Henry, Ellis [birth year], MRN [id number]  PCP:  Henry Ellis ORN, MD   [facility]  Cardiologist:  Henry DELENA Leavens, MD  Advanced Practice Provider:  No care team member to display Electrophysiologist:  None      CC: AFL f/u  History of Present Illness:    Henry Ellis is a 68 y.o. male with a hx of HTN, Aortic Atherosclerosis HLD, Distant smoking in 2017; morbid obesiity who presents for evaluation 03/11/21.  2022: In interim of this visit, patient had normal echo with stable leg swelling; he retired. 2023: Echo with normal LV function, mild LA dilation, had itching with eliquis  and needed Xarelto  2024: Saw Henry Ellis had no issues. Social:  Retired; goes to [employer] with his nieces and nephews every summer   Discussed the use of AI scribe software for clinical note transcription with the patient, who gave verbal consent to proceed.  Henry Ellis is a 68 year old with atrial flutter and coronary artery calcifications who presents for routine follow-up. He has a history of atrial flutter and permanent atrial fibrillation, currently managed with Xarelto  due to an allergy to Eliquis , which caused a rash. No issues with arrhythmias, chest pain, or dyspnea. He has coronary artery calcifications and aortic atherosclerosis. His cholesterol levels are well controlled.  Dr. 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  Stable function He experiences lower extremity edema and uses torsemide  as needed, with about 160 pills at home. He tries to minimize salt and processed foods in his diet.  Past Medical History:  Diagnosis Date   Allergy    seasonal allergies   Anemia    on meds   Anxiety    hx of   Arthritis    generalized   Barrett's esophagus    Cancer (HCC) 10/17/2016   kidney cancer(removed left kidney in  2017)   Carpal tunnel syndrome, right 04/21/2011   Chronic bronchitis    Chronic kidney disease 2017   2017-had left kidney removed   ED (erectile dysfunction)    Esophageal reflux    on meds   Esophageal stricture    Essential hypertension 10/19/2015   on meds   H/O adenomatous polyp of colon 06/05/2012   Hiatal hernia    High cholesterol    on meds   History of Bell's palsy    IBS (irritable bowel syndrome)    Personal history of colonic polyps 04/15/2008 & 04/22/2012   TUBULAR ADENOMA   Ulcerative esophagitis     Past Surgical History:  Procedure Laterality Date   CARPAL TUNNEL RELEASE     left   INGUINAL HERNIA REPAIR     bilateral   KNEE ARTHROSCOPY     right Dr Henry Ellis   ROBOT ASSISTED LAPAROSCOPIC NEPHRECTOMY Left 10/18/2016   Procedure: XI ROBOTIC ASSISTED LAPAROSCOPIC RADICAL NEPHRECTOMY;  Surgeon: Henry Likens, MD;  Location: [facility];  Service: Urology;  Laterality: Left;   WISDOM TOOTH EXTRACTION      Current Medications: Current Meds  Medication Sig   atorvastatin  (LIPITOR) 40 MG tablet Take 1 tablet (40 mg total) by mouth daily.   Cholecalciferol (VITAMIN D3 PO) Take 5,000 Units by mouth daily.   Cyanocobalamin  (VITAMIN B-12 PO) Take by mouth daily.   dapagliflozin  propanediol (FARXIGA ) 10  MG TABS tablet Take 1 tablet (10 mg total) by mouth daily before breakfast.   docusate sodium  (COLACE) 100 MG capsule Take 100 mg by mouth daily.   esomeprazole  (NEXIUM ) 20 MG capsule Take 1 capsule (20 mg total) by mouth daily at 12 noon.   ferrous sulfate 325 (65 FE) MG tablet Take 325 mg by mouth daily with breakfast.   finasteride  (PROSCAR ) 5 MG tablet Take 1 tablet (5 mg total) by mouth daily.   losartan  (COZAAR ) 50 MG tablet Take 1 tablet (50 mg total) by mouth daily.   Psyllium 30.9 % POWD Take 1 packet by mouth daily.   solifenacin  (VESICARE ) 5 MG tablet Take 1 tablet (5 mg total) by mouth daily.   torsemide  (DEMADEX ) 20 MG tablet Take 1 tablet (20 mg total) by mouth  daily. (Patient taking differently: Take 20 mg by mouth as needed (fluid retention).)   [DISCONTINUED] XARELTO  20 MG TABS tablet TAKE 1 TABLET(20 MG) BY MOUTH DAILY WITH SUPPER     Allergies:   Bee venom, Amoxicillin, Apixaban , Aspirin, Lidocaine , and Other   Social History   Socioeconomic History   Marital status: Single    Spouse name: Not on file   Number of children: Not on file   Years of education: Not on file   Highest education level: Not on file  Occupational History   Occupation: Curator    Employer: [employer]   Occupation: RETIRED  Tobacco Use   Smoking status: Former    Current packs/day: 0.00    Types: Cigarettes    Quit date: 10/17/2016    Years since quitting: 7.9   Smokeless tobacco: Never   Tobacco comments:    Counseling sheet given 03-2012   Vaping Use   Vaping status: Former  Substance and Sexual Activity   Alcohol use: Yes    Alcohol/week: 3.0 standard drinks of alcohol    Types: 3 Shots of liquor per week   Drug use: Not Currently    Frequency: 2.0 times per week    Types: Marijuana    Comment: last use June 05, 2016   Sexual activity: Not on file  Other Topics Concern   Not on file  Social History Narrative   Lives alone/2025   Social Drivers of Health   Financial Resource Strain: Low Risk  (07/14/2024)   Overall Financial Resource Strain (CARDIA)    Difficulty of Paying Living Expenses: Not hard at all  Food Insecurity: No Food Insecurity (07/14/2024)   Hunger Vital Sign    Worried About Running Out of Food in the Last Year: Never true    Ran Out of Food in the Last Year: Never true  Transportation Needs: No Transportation Needs (07/14/2024)   PRAPARE - Administrator, Civil Service (Medical): No    Lack of Transportation (Non-Medical): No  Physical Activity: Sufficiently Active (07/14/2024)   Exercise Vital Sign    Days of Exercise per Week: 4 days    Minutes of Exercise per Session: 40 min  Stress: No Stress  Concern Present (07/14/2024)   [employer] of Occupational Health - Occupational Stress Questionnaire    Feeling of Stress: Not at all  Social Connections: Socially Isolated (07/14/2024)   Social Connection and Isolation Panel    Frequency of Communication with Friends and Family: More than three times a week    Frequency of Social Gatherings with Friends and Family: More than three times a week    Attends Religious Services: Never  Active Member of Clubs or Organizations: No    Attends Banker Meetings: Never    Marital Status: Never married     Family History: The patient's family history includes Heart disease in his father; Hypertension in his brother; Lung cancer in his father. There is no history of Colon cancer, Stomach cancer, Esophageal cancer, Rectal cancer, or Colon polyps.  History of coronary artery disease notable for father and oldest brother. History of heart failure notable for no members. History of arrhythmia notable for sister has pacemaker.  ROS:   Please see the history of present illness.     EKGs/Labs/Other Studies Reviewed:     Cardiac Studies & Procedures   ______________________________________________________________________________________________     ECHOCARDIOGRAM  ECHOCARDIOGRAM COMPLETE 04/02/2023  Narrative ECHOCARDIOGRAM REPORT    Patient Name:   Henry Ellis [facility] Date of Exam: 04/02/2023 Medical Rec #:  [id number]         Height:       73.0 in Accession #:    [id number]        Weight:       315.0 lb Date of Birth:  Jun 03, 1956         BSA:          2.610 m Patient Age:    66 years          BP:           146/93 mmHg Patient Gender: M                 HR:           71 bpm. Exam Location:  [address]  Procedure: 2D Echo, Cardiac Doppler, Color Doppler and Intracardiac Opacification Agent  Indications:    I48.91 Atrial Flutter  History:        Patient has prior history of Echocardiogram examinations, most recent  04/03/2022. CKD; Risk Factors:Hypertension and HLD.  Sonographer:    Waldo Guadalajara RCS Referring Phys: [id number] Bryler Dibble A Taneal Sonntag  IMPRESSIONS   1. Left ventricular ejection fraction, by estimation, is 60 to 65%. The left ventricle has normal function. The left ventricle has no regional wall motion abnormalities. There is mild left ventricular hypertrophy. Left ventricular diastolic parameters are indeterminate. 2. Right ventricular systolic function is normal. The right ventricular size is normal. There is normal pulmonary artery systolic pressure. 3. The mitral valve is normal in structure. No evidence of mitral valve regurgitation. No evidence of mitral stenosis. 4. The aortic valve is tricuspid. Aortic valve regurgitation is not visualized. No aortic stenosis is present. 5. The inferior vena cava is normal in size with greater than 50% respiratory variability, suggesting right atrial pressure of 3 mmHg.  Comparison(s): No significant change from prior study. Prior images reviewed side by side.  FINDINGS Left Ventricle: Left ventricular ejection fraction, by estimation, is 60 to 65%. The left ventricle has normal function. The left ventricle has no regional wall motion abnormalities. The left ventricular internal cavity size was normal in size. There is mild left ventricular hypertrophy. Left ventricular diastolic parameters are indeterminate.  Right Ventricle: The right ventricular size is normal. No increase in right ventricular wall thickness. Right ventricular systolic function is normal. There is normal pulmonary artery systolic pressure. The tricuspid regurgitant velocity is 1.33 m/s, and with an assumed right atrial pressure of 3 mmHg, the estimated right ventricular systolic pressure is 10.1 mmHg.  Left Atrium: Left atrial size was normal in size.  Right Atrium: Right atrial size was normal  in size.  Pericardium: Trivial pericardial effusion is present. The pericardial  effusion is posterior to the left ventricle.  Mitral Valve: The mitral valve is normal in structure. No evidence of mitral valve regurgitation. No evidence of mitral valve stenosis.  Tricuspid Valve: The tricuspid valve is normal in structure. Tricuspid valve regurgitation is not demonstrated. No evidence of tricuspid stenosis.  Aortic Valve: The aortic valve is tricuspid. Aortic valve regurgitation is not visualized. No aortic stenosis is present.  Pulmonic Valve: The pulmonic valve was normal in structure. Pulmonic valve regurgitation is trivial. No evidence of pulmonic stenosis.  Aorta: The aortic root is normal in size and structure.  Venous: The inferior vena cava is normal in size with greater than 50% respiratory variability, suggesting right atrial pressure of 3 mmHg.  IAS/Shunts: No atrial level shunt detected by color flow Doppler.   LEFT VENTRICLE PLAX 2D LVIDd:         5.20 cm   Diastology LVIDs:         3.70 cm   LV e' medial:    14.60 cm/s LV PW:         1.30 cm   LV E/e' medial:  8.0 LV IVS:        1.10 cm   LV e' lateral:   18.90 cm/s LVOT diam:     2.20 cm   LV E/e' lateral: 6.2 LV SV:         65 LV SV Index:   25 LVOT Area:     3.80 cm   RIGHT VENTRICLE RVSP:           10.1 mmHg  LEFT ATRIUM             Index        RIGHT ATRIUM LA diam:        4.70 cm 1.80 cm/m   RA Pressure: 3.00 mmHg LA Vol (A2C):   78.7 ml 30.16 ml/m LA Vol (A4C):   72.3 ml 27.70 ml/m LA Biplane Vol: 79.0 ml 30.27 ml/m AORTIC VALVE LVOT Vmax:   82.20 cm/s LVOT Vmean:  48.900 cm/s LVOT VTI:    0.170 m  AORTA Ao Root diam: 3.40 cm Ao Asc diam:  3.60 cm  MITRAL VALVE                TRICUSPID VALVE MV Area (PHT):              TR Peak grad:   7.1 mmHg MV Decel Time:              TR Vmax:        133.00 cm/s MV E velocity: 116.50 cm/s  Estimated RAP:  3.00 mmHg RVSP:           10.1 mmHg  SHUNTS Systemic VTI:  0.17 m Systemic Diam: 2.20 cm  Oneil Parchment MD Electronically  signed by Oneil Parchment MD Signature Date/Time: 04/02/2023/12:22:30 PM    Final      CT SCANS  CT CARDIAC SCORING (SELF PAY ONLY) 09/20/2022  Addendum 09/20/2022 12:31 PM ADDENDUM REPORT: 09/20/2022 12:29  EXAM: OVER-READ INTERPRETATION CARDIAC CT CHEST  The following report is an over-read performed by radiologist Dr. Ryan Salvage of [facility] on 09/20/2022. This over-read does not include interpretation of cardiac or coronary anatomy or pathology. The coronary calcium  score interpretation by the cardiologist is attached.  COMPARISON:  Chest CT 11/28/2021  FINDINGS: Extracardiac Vascular: Unremarkable  Mediastinum: Unremarkable  Lung: Unremarkable  Included  Upper Abdomen: Fluid density lesion in the dome of the right hepatic lobe compatible with cyst.  Musculoskeletal: Lower thoracic spondylosis. Gynecomastia.  IMPRESSION: 1. Right hepatic lobe cyst. 2. Gynecomastia.   Electronically Signed By: Ryan Salvage M.D. On: 09/20/2022 12:29  Narrative : Cardiovascular Disease Risk stratification  EXAM: Coronary Calcium  Score  TECHNIQUE: A gated, non-contrast computed tomography scan of the heart was performed using 3mm slice thickness. Axial images were analyzed on a dedicated workstation. Calcium  scoring of the coronary arteries was performed using the Agatston method.  FINDINGS: Coronary arteries: Normal origins.  Coronary Calcium  Score:  Left main: 0  Left anterior descending artery: 0.69  Left circumflex artery: 0  Right coronary artery: 0  Total: 0.69  Percentile: 18  Pericardium: Normal.  Ascending Aorta: Normal caliber.  Mild to moderate mitral annular calcification  Non-cardiac: See separate report from [facility].  IMPRESSION: Coronary calcium  score of 0.69. This was 18 percentile for age-, race-, and sex-matched controls.  RECOMMENDATIONS: Coronary artery calcium  (CAC) score is a strong  predictor of incident coronary heart disease (CHD) and provides predictive information beyond traditional risk factors. CAC scoring is reasonable to use in the decision to withhold, postpone, or initiate statin therapy in intermediate-risk or selected borderline-risk asymptomatic adults (age 21-75 years and LDL-C >=70 to <190 mg/dL) who do not have diabetes or established atherosclerotic cardiovascular disease (ASCVD).* In intermediate-risk (10-year ASCVD risk >=7.5% to <20%) adults or selected borderline-risk (10-year ASCVD risk >=5% to <7.5%) adults in whom a CAC score is measured for the purpose of making a treatment decision the following recommendations have been made:  If CAC=0, it is reasonable to withhold statin therapy and reassess in 5 to 10 years, as long as higher risk conditions are absent (diabetes mellitus, family history of premature CHD in first degree relatives (males <55 years; females <65 years), cigarette smoking, or LDL >=190 mg/dL).  If CAC is 1 to 99, it is reasonable to initiate statin therapy for patients >=66 years of age.  If CAC is >=100 or >=75th percentile, it is reasonable to initiate statin therapy at any age.  Cardiology referral should be considered for patients with CAC scores >=400 or >=75th percentile.  *2018 AHA/ACC/AACVPR/AAPA/ABC/ACPM/ADA/AGS/APhA/ASPC/NLA/PCNA Guideline on the Management of Blood Cholesterol: A Report of the American College of Cardiology/American Heart Association Task Force on Clinical Practice Guidelines. J Am Coll Cardiol. 2019;73(24):3168-3209.  Kardie Tobb, DO [facility]  The noncardiac portion of this study will be interpreted in separate report by the radiologist.  Electronically Signed: By: Kardie  Tobb D.O. On: 09/20/2022 11:47     ______________________________________________________________________________________________       Recent Labs: 12/04/2023: NT-Pro BNP 4,028 04/08/2024: Hemoglobin 12.2;  Magnesium  2.2; Platelets 189; TSH 1.012 08/21/2024: ALT 18; BUN 24; Creatinine, Ser 1.55; Potassium 4.7; Sodium 139  Recent Lipid Panel    Component Value Date/Time   CHOL 94 08/21/2024 0819   TRIG 72.0 08/21/2024 0819   HDL 43.10 08/21/2024 0819   CHOLHDL 2 08/21/2024 0819   VLDL 14.4 08/21/2024 0819   LDLCALC 37 08/21/2024 0819   LDLDIRECT 151.7 03/25/2010 1108    Physical Exam:    VS:  BP 120/60   Pulse (!) 42   Ht 6' 1 (1.854 m)   Wt 300 lb (136.1 kg)   SpO2 98%   BMI 39.58 kg/m     Wt Readings from Last 3 Encounters:  09/11/24 300 lb (136.1 kg)  08/28/24 (!) 300 lb 12.8 oz (136.4 kg)  07/14/24 300  lb (136.1 kg)    Gen: no distress, morbid obesity   Neck: No JVD, thick neck Cardiac: No Rubs or Gallops, no Murmur, regular bradycardia distant heart sounds  Respiratory: Clear to auscultation bilaterally, normal effort, normal  respiratory rate GI: Soft, nontender, non-distended  MS: Trace bilateral edema;  moves all extremities Integument: Skin feels well Neuro:  At time of evaluation, alert and oriented to person/place/time/situation  Psych: Normal affect, patient feels good  ASSESSMENT/PLAN:    Permanent atrial flutter Bradycardia Permanent atrial flutter, well-controlled on anticoagulation with Xarelto  due to an allergy to Eliquis . No recent episodes of atrial flutter or fibrillation. CHADS-VASc score greater than two, indicating continued need for anticoagulation. - Refill Xarelto  prescription; Allergy to apixaban  (Eliquis ) characterized by rash. Currently managed with Xarelto  as an alternative anticoagulant. - Monitor for symptoms of atrial flutter or bradycardia; he feels great; We have discussed ablation in the past (Deferred)  Atherosclerotic heart disease with coronary artery calcification and aortic atherosclerosis HLD Atherosclerotic heart disease with coronary artery calcification and aortic atherosclerosis. Cholesterol levels are at goal, indicating  effective management of hyperlipidemia. - no angina - LDL at goal with current therapy; will continue  Hypertension - Hypertension is well-controlled with current management.  Hyperlipidemia Hyperlipidemia is well-managed with cholesterol levels at goal.  Chronic kidney disease stage 3 with solitary kidney Lower extremity edema - Chronic kidney disease stage 3 with a solitary kidney. Kidney function remains stable with no reported deterioration. Chronic lower extremity edema, mildly improved compared to last year. No open wounds present. Edema management includes lifestyle modifications such as reducing salt intake. He prefers to avoid diuretics unless shortness of breath occurs (I had recommend standing torsemide ) - Consider use of compression stockings if comfortable - Monitor for shortness of breath; if present, consider diuretic rechallenge  Morbid obesity - Morbid obesity noted. Lifestyle modifications discussed to aid in management.  Longitudinal care: The evaluation and management services provided today reflect the complexity inherent in caring for this patient, including the ongoing longitudinal relationship and management of multiple chronic conditions and/or the need for care coordination. The visit required a comprehensive assessment and management plan tailored to the patient's unique needs Time was spent addressing not only the acute concerns but also the broader context of the patient's health, including preventive care, chronic disease management, and care coordination as appropriate.  Complex longitudinal is necessary for conditions including: LE edema with no desire to start diuretics   Henry Ellis or me in one year   Henry Leavens, MD FASE [facility] Cardiologist [facility]  [address] [place], [place] [address] [phone]  9:08 AM

## 2024-10-29 DIAGNOSIS — R972 Elevated prostate specific antigen [PSA]: Secondary | ICD-10-CM | POA: Diagnosis not present

## 2024-11-04 DIAGNOSIS — N401 Enlarged prostate with lower urinary tract symptoms: Secondary | ICD-10-CM | POA: Diagnosis not present

## 2024-11-04 DIAGNOSIS — R972 Elevated prostate specific antigen [PSA]: Secondary | ICD-10-CM | POA: Diagnosis not present

## 2024-11-10 ENCOUNTER — Other Ambulatory Visit: Payer: Self-pay | Admitting: Internal Medicine

## 2024-11-10 ENCOUNTER — Other Ambulatory Visit: Payer: Self-pay

## 2024-11-12 ENCOUNTER — Ambulatory Visit: Admitting: Podiatry

## 2024-11-12 DIAGNOSIS — M79671 Pain in right foot: Secondary | ICD-10-CM | POA: Diagnosis not present

## 2024-11-12 DIAGNOSIS — B351 Tinea unguium: Secondary | ICD-10-CM | POA: Diagnosis not present

## 2024-11-12 DIAGNOSIS — M79672 Pain in left foot: Secondary | ICD-10-CM | POA: Diagnosis not present

## 2024-11-12 NOTE — Progress Notes (Signed)
 Patient presents for evaluation and treatment of tenderness and some redness around nails feet.  Tenderness around toes with walking and wearing shoes.  Physical exam:  General appearance: Alert, pleasant, and in no acute distress.  Vascular: Pedal pulses: DP 2/4 B/L, PT 0/4 B/L. Mild edema lower legs bilaterally.  Capillary refill time immediate bilaterally  Neurologic:  Dermatologic:  Nails thickened, disfigured, discolored 1-5 BL with subungual debris.  Redness and hypertrophic nail folds along nail folds bilaterally but no signs of drainage or infection.  Musculoskeletal:     Diagnosis: 1. Painful onychomycotic nails 1 through 5 bilaterally. 2. Pain toes 1 through 5 bilaterally.  Plan: -Debrided onychomycotic nails 1 through 5 bilaterally.  Sharply debrided nails with nail clipper and reduced with a power bur.  Return 3 months RFC

## 2024-11-18 DIAGNOSIS — K080 Exfoliation of teeth due to systemic causes: Secondary | ICD-10-CM | POA: Diagnosis not present

## 2024-12-01 ENCOUNTER — Ambulatory Visit (HOSPITAL_COMMUNITY): Admission: RE | Admit: 2024-12-01 | Discharge: 2024-12-01 | Attending: *Deleted | Admitting: *Deleted

## 2024-12-01 DIAGNOSIS — Z122 Encounter for screening for malignant neoplasm of respiratory organs: Secondary | ICD-10-CM | POA: Insufficient documentation

## 2024-12-01 DIAGNOSIS — J439 Emphysema, unspecified: Secondary | ICD-10-CM | POA: Insufficient documentation

## 2024-12-01 DIAGNOSIS — Z87891 Personal history of nicotine dependence: Secondary | ICD-10-CM | POA: Insufficient documentation

## 2024-12-01 DIAGNOSIS — I700 Atherosclerosis of aorta: Secondary | ICD-10-CM | POA: Insufficient documentation

## 2024-12-01 DIAGNOSIS — R918 Other nonspecific abnormal finding of lung field: Secondary | ICD-10-CM | POA: Insufficient documentation

## 2024-12-08 DIAGNOSIS — K080 Exfoliation of teeth due to systemic causes: Secondary | ICD-10-CM | POA: Diagnosis not present

## 2024-12-15 ENCOUNTER — Other Ambulatory Visit: Payer: Self-pay

## 2024-12-15 DIAGNOSIS — Z122 Encounter for screening for malignant neoplasm of respiratory organs: Secondary | ICD-10-CM

## 2024-12-15 DIAGNOSIS — Z87891 Personal history of nicotine dependence: Secondary | ICD-10-CM

## 2025-02-13 ENCOUNTER — Ambulatory Visit: Admitting: Podiatry

## 2025-02-18 ENCOUNTER — Other Ambulatory Visit

## 2025-02-25 ENCOUNTER — Ambulatory Visit: Admitting: Internal Medicine

## 2025-03-26 ENCOUNTER — Encounter: Admitting: Family Medicine

## 2025-07-15 ENCOUNTER — Ambulatory Visit
